# Patient Record
Sex: Male | Born: 1940
Health system: Southern US, Community
[De-identification: ages and names within clinical notes are randomized; demographics above are authoritative.]

## PROBLEM LIST (undated history)

## (undated) DIAGNOSIS — K219 Gastro-esophageal reflux disease without esophagitis: Secondary | ICD-10-CM

## (undated) DIAGNOSIS — E039 Hypothyroidism, unspecified: Secondary | ICD-10-CM

## (undated) DIAGNOSIS — I2510 Atherosclerotic heart disease of native coronary artery without angina pectoris: Secondary | ICD-10-CM

## (undated) DIAGNOSIS — I10 Essential (primary) hypertension: Secondary | ICD-10-CM

## (undated) DIAGNOSIS — T7840XA Allergy, unspecified, initial encounter: Secondary | ICD-10-CM

## (undated) DIAGNOSIS — H409 Unspecified glaucoma: Secondary | ICD-10-CM

## (undated) DIAGNOSIS — E7800 Pure hypercholesterolemia, unspecified: Secondary | ICD-10-CM

## (undated) DIAGNOSIS — M1990 Unspecified osteoarthritis, unspecified site: Secondary | ICD-10-CM

## (undated) DIAGNOSIS — H269 Unspecified cataract: Secondary | ICD-10-CM

## (undated) DIAGNOSIS — R011 Cardiac murmur, unspecified: Secondary | ICD-10-CM

## (undated) DIAGNOSIS — I219 Acute myocardial infarction, unspecified: Secondary | ICD-10-CM

## (undated) HISTORY — DX: Unspecified cataract: H26.9

## (undated) HISTORY — DX: Unspecified osteoarthritis, unspecified site: M19.90

## (undated) HISTORY — DX: Cardiac murmur, unspecified: R01.1

## (undated) HISTORY — DX: Acute myocardial infarction, unspecified: I21.9

## (undated) HISTORY — DX: Gastro-esophageal reflux disease without esophagitis: K21.9

## (undated) HISTORY — DX: Allergy, unspecified, initial encounter: T78.40XA

## (undated) HISTORY — PX: DENTAL RESTORATION/EXTRACTION WITH X-RAY: SHX5796

## (undated) HISTORY — DX: Unspecified glaucoma: H40.9

---

## 2012-02-23 HISTORY — PX: CORONARY ARTERY BYPASS GRAFT: SHX141

## 2012-09-04 ENCOUNTER — Non-Acute Institutional Stay (SKILLED_NURSING_FACILITY): Payer: Medicare Other | Admitting: Adult Health

## 2012-09-04 DIAGNOSIS — Z951 Presence of aortocoronary bypass graft: Secondary | ICD-10-CM

## 2012-09-04 DIAGNOSIS — I10 Essential (primary) hypertension: Secondary | ICD-10-CM

## 2012-09-04 DIAGNOSIS — I214 Non-ST elevation (NSTEMI) myocardial infarction: Secondary | ICD-10-CM

## 2012-09-04 DIAGNOSIS — I498 Other specified cardiac arrhythmias: Secondary | ICD-10-CM

## 2012-09-04 DIAGNOSIS — E039 Hypothyroidism, unspecified: Secondary | ICD-10-CM

## 2012-09-04 DIAGNOSIS — E785 Hyperlipidemia, unspecified: Secondary | ICD-10-CM

## 2012-09-04 DIAGNOSIS — G4700 Insomnia, unspecified: Secondary | ICD-10-CM

## 2012-09-07 ENCOUNTER — Non-Acute Institutional Stay (SKILLED_NURSING_FACILITY): Payer: Medicare Other | Admitting: Internal Medicine

## 2012-09-07 DIAGNOSIS — I10 Essential (primary) hypertension: Secondary | ICD-10-CM

## 2012-09-07 DIAGNOSIS — K219 Gastro-esophageal reflux disease without esophagitis: Secondary | ICD-10-CM

## 2012-09-07 DIAGNOSIS — I214 Non-ST elevation (NSTEMI) myocardial infarction: Secondary | ICD-10-CM

## 2012-09-07 DIAGNOSIS — E039 Hypothyroidism, unspecified: Secondary | ICD-10-CM

## 2012-09-18 ENCOUNTER — Encounter: Payer: Self-pay | Admitting: Adult Health

## 2012-09-18 DIAGNOSIS — I498 Other specified cardiac arrhythmias: Secondary | ICD-10-CM | POA: Insufficient documentation

## 2012-09-18 DIAGNOSIS — I10 Essential (primary) hypertension: Secondary | ICD-10-CM | POA: Insufficient documentation

## 2012-09-18 DIAGNOSIS — G4700 Insomnia, unspecified: Secondary | ICD-10-CM | POA: Insufficient documentation

## 2012-09-18 DIAGNOSIS — Z951 Presence of aortocoronary bypass graft: Secondary | ICD-10-CM | POA: Insufficient documentation

## 2012-09-18 DIAGNOSIS — E785 Hyperlipidemia, unspecified: Secondary | ICD-10-CM | POA: Insufficient documentation

## 2012-09-18 DIAGNOSIS — E039 Hypothyroidism, unspecified: Secondary | ICD-10-CM | POA: Insufficient documentation

## 2012-09-18 DIAGNOSIS — I214 Non-ST elevation (NSTEMI) myocardial infarction: Secondary | ICD-10-CM | POA: Insufficient documentation

## 2012-09-18 NOTE — Progress Notes (Signed)
  Subjective:    Patient ID: Jeffrey Rhodes, male    DOB: September 27, 1940, 72 y.o.   MRN: [id number]  HPI This is a 72 year old male who is being admitted to [facility] on 09/02/12 from [facility]. He was having chest pain and was diagnosed with non-ST elevation myocardial infarction and now status post multi-vessel coronary artery bypass grafting. He has been admitted for short-term rehabilitation. The [facility] shows 9.666 - elevated. He has hypothyroidism and currently taking Synthroid.   Review of Systems  Constitutional: Negative.   HENT: Negative.   Respiratory: Negative for cough, chest tightness and shortness of breath.   Cardiovascular: Negative.   Gastrointestinal: Negative for abdominal pain and abdominal distention.  Endocrine: Negative.   Genitourinary: Negative.   Neurological: Negative.   Psychiatric/Behavioral: Negative.        Objective:   Physical Exam  Nursing note and vitals reviewed. Constitutional: He is oriented to person, place, and time. He appears well-developed and well-nourished.  HENT:  Head: Normocephalic and atraumatic.  Right Ear: External ear normal.  Left Ear: External ear normal.  Nose: Nose normal.  Mouth/Throat: Oropharynx is clear and moist.  Eyes: Conjunctivae and EOM are normal. Pupils are equal, round, and reactive to light.  Cardiovascular: Normal rate, regular rhythm, normal heart sounds and intact distal pulses.   Pulmonary/Chest: Effort normal and breath sounds normal. No respiratory distress.  Abdominal: Soft. Bowel sounds are normal.  Musculoskeletal: Normal range of motion. He exhibits no edema and no tenderness.  Neurological: He is alert and oriented to person, place, and time.  Skin: Skin is warm and dry.  Psychiatric: He has a normal mood and affect. His behavior is normal. Judgment and thought content normal.     LABS/RADIOLOGY: 09/04/12 TSH 9.666 lipid profile normal 09/01/12 WBC 16.7 hemoglobin 9.6 hematocrit 28.0  sodium 137 potassium 3.8 glucose 120 BUN 16 creatinine 0.89 calcium 8.9    Medications reviewed.    Assessment & Plan:   Non-ST elevation MI (NSTEMI) -stable  S/P CABG (coronary artery bypass graft) - for PT and OT  Dyslipidemia - stable  Sinus arrhythmia - rate controlled  Essential hypertension, benign - well-controlled  Hypothyroidism - increase Synthroid to 137 mcg 1 tab by mouth daily; TSH in 6 weeks  Insomnia - change on BN 5 mg to 1 tablet by mouth each bedtime when necessary

## 2012-10-03 ENCOUNTER — Other Ambulatory Visit: Payer: Self-pay | Admitting: Adult Health

## 2012-10-04 NOTE — Progress Notes (Signed)
Patient ID: Jeffrey Rhodes, male   DOB: [birth year], 72 y.o.   MRN: [id number]        HISTORY & PHYSICAL  DATE: 09/07/2012   FACILITY: [facility]  LEVEL OF CARE: SNF (31)  ALLERGIES:   NKDA.   CHIEF COMPLAINT:  Manage non-ST elevation acute MI, hypothyroidism, and hypertension.    HISTORY OF PRESENT ILLNESS:  The patient is a 72 year-old, Caucasian male.   NON-ST ELEVATION ACUTE MI:  The patient was in for a non-ST elevation acute MI.  Selective coronary angiography demonstrated severe multi-vessel coronary artery disease with an EF of 60%.  Therefore, he underwent three-vessel CABG without any complications.  He denies chest pain, shortness of breath or palpitations.  He is admitted to this facility for short-term rehabilitation.      HYPOTHYROIDISM: The hypothyroidism remains stable. No complications noted from the medications presently being used.  The patient denies fatigue or constipation.  Last TSH:  9.666.  HTN: Pt 's HTN remains stable.  Denies CP, sob, DOE, pedal edema, headaches, dizziness or visual disturbances.  No complications from the medications currently being used.  Last BP :  136/74, 134/72, 120/65.    PAST MEDICAL HISTORY :  Hypertension.    Hypothyroidism.    PAST SURGICAL HISTORY:none  SOCIAL HISTORY: TOBACCO USE:  The patient was a smoker, but quit five years ago. ALCOHOL:  Denies alcohol use.  ILLICIT DRUGS:  Denies illicit drug use.    FAMILY HISTORY:  SIBLINGS:  Brother had a heart valve replacement.    CURRENT MEDICATIONS: Reviewed per [facility]  REVIEW OF SYSTEMS:  See HPI otherwise 14 point ROS is negative.  PHYSICAL EXAMINATION  VS:  T 97.6       P 66      RR 20      BP 136/74      POX 98% room air        WT (Lb)  GENERAL: no acute distress, normal body habitus SKIN: warm & dry, no suspicious lesions or rashes, no excessive dryness, chest incision clean and dry  EYES: conjunctivae normal, sclerae normal, normal eye  lids MOUTH/THROAT: lips without lesions,no lesions in the mouth,tongue is without lesions,uvula elevates in midline NECK: supple, trachea midline, no neck masses, no thyroid tenderness, no thyromegaly LYMPHATICS: no LAN in the neck, no supraclavicular LAN RESPIRATORY: breathing is even & unlabored, BS CTAB CARDIAC: RRR, no murmur,no extra heart sounds EDEMA/VARICOSITIES:  +1 bilateral lower extremity pitting edema  ARTERIAL:  pedal pulses +1  GI:  ABDOMEN: abdomen soft, normal BS, no masses, no tenderness  LIVER/SPLEEN: no hepatomegaly, no splenomegaly MUSCULOSKELETAL: HEAD: normal to inspection & palpation BACK: no kyphosis, scoliosis or spinal processes tenderness EXTREMITIES: LEFT UPPER EXTREMITY: strength intact, range of motion moderate due to surgery RIGHT UPPER EXTREMITY: strength intact, range of motion moderate due to surgery LEFT LOWER EXTREMITY: strength intact, range of motion moderate  RIGHT LOWER EXTREMITY: strength intact, range of motion moderate  PSYCHIATRIC: the patient is alert & oriented to person, affect & behavior appropriate  LABS/RADIOLOGY: HDL 25, otherwise lipid profile normal.   WBC 16.7, hemoglobin 9.6, MCV 90.9, platelets 215.  Glucose 120, otherwise BMP normal.    Urinalysis negative.   MRSA by PCR negative.     Albumin 3.4, otherwise liver profile normal.   ASSESSMENT/PLAN:  Acute non-ST elevation MI.  Status post CABG.  Continue rehabilitation.   Hypothyroidism.  TSH elevated.  Synthroid was increased.  Recheck TSH in six  weeks pending.    Hypertension.  Well controlled.    GERD.  Well controlled.     Hyperlipidemia.  Well controlled.    Allergic rhinitis.  Denies ongoing symptoms.     I have reviewed patient's medical records received at admission/from hospitalization.  CPT CODE: 96045

## 2012-10-05 DIAGNOSIS — K219 Gastro-esophageal reflux disease without esophagitis: Secondary | ICD-10-CM | POA: Insufficient documentation

## 2015-03-17 DIAGNOSIS — E785 Hyperlipidemia, unspecified: Secondary | ICD-10-CM | POA: Diagnosis not present

## 2015-03-17 DIAGNOSIS — M5030 Other cervical disc degeneration, unspecified cervical region: Secondary | ICD-10-CM | POA: Diagnosis not present

## 2015-03-17 DIAGNOSIS — E039 Hypothyroidism, unspecified: Secondary | ICD-10-CM | POA: Diagnosis not present

## 2015-03-17 DIAGNOSIS — I10 Essential (primary) hypertension: Secondary | ICD-10-CM | POA: Diagnosis not present

## 2015-03-17 DIAGNOSIS — Z23 Encounter for immunization: Secondary | ICD-10-CM | POA: Diagnosis not present

## 2015-03-17 DIAGNOSIS — Z760 Encounter for issue of repeat prescription: Secondary | ICD-10-CM | POA: Diagnosis not present

## 2015-03-17 DIAGNOSIS — M2550 Pain in unspecified joint: Secondary | ICD-10-CM | POA: Diagnosis not present

## 2015-03-17 DIAGNOSIS — G4709 Other insomnia: Secondary | ICD-10-CM | POA: Diagnosis not present

## 2015-03-17 DIAGNOSIS — R7309 Other abnormal glucose: Secondary | ICD-10-CM | POA: Diagnosis not present

## 2015-04-26 DIAGNOSIS — M25541 Pain in joints of right hand: Secondary | ICD-10-CM | POA: Diagnosis not present

## 2015-04-27 ENCOUNTER — Emergency Department (HOSPITAL_COMMUNITY): Payer: Medicare Other

## 2015-04-27 ENCOUNTER — Encounter (HOSPITAL_COMMUNITY): Payer: Self-pay | Admitting: Emergency Medicine

## 2015-04-27 ENCOUNTER — Observation Stay (HOSPITAL_COMMUNITY)
Admission: EM | Admit: 2015-04-27 | Discharge: 2015-04-28 | Disposition: A | Discharge status: Home / Self Care | Payer: Medicare Other | Attending: Cardiology | Admitting: Cardiology

## 2015-04-27 DIAGNOSIS — R110 Nausea: Secondary | ICD-10-CM | POA: Diagnosis not present

## 2015-04-27 DIAGNOSIS — Z7902 Long term (current) use of antithrombotics/antiplatelets: Secondary | ICD-10-CM | POA: Diagnosis not present

## 2015-04-27 DIAGNOSIS — Z7982 Long term (current) use of aspirin: Secondary | ICD-10-CM | POA: Diagnosis not present

## 2015-04-27 DIAGNOSIS — E039 Hypothyroidism, unspecified: Secondary | ICD-10-CM | POA: Diagnosis present

## 2015-04-27 DIAGNOSIS — E785 Hyperlipidemia, unspecified: Secondary | ICD-10-CM | POA: Diagnosis present

## 2015-04-27 DIAGNOSIS — I10 Essential (primary) hypertension: Secondary | ICD-10-CM | POA: Diagnosis not present

## 2015-04-27 DIAGNOSIS — Z951 Presence of aortocoronary bypass graft: Secondary | ICD-10-CM

## 2015-04-27 DIAGNOSIS — I25119 Atherosclerotic heart disease of native coronary artery with unspecified angina pectoris: Secondary | ICD-10-CM | POA: Diagnosis not present

## 2015-04-27 DIAGNOSIS — Z87891 Personal history of nicotine dependence: Secondary | ICD-10-CM | POA: Insufficient documentation

## 2015-04-27 DIAGNOSIS — Z79899 Other long term (current) drug therapy: Secondary | ICD-10-CM | POA: Diagnosis not present

## 2015-04-27 DIAGNOSIS — I209 Angina pectoris, unspecified: Secondary | ICD-10-CM | POA: Diagnosis not present

## 2015-04-27 DIAGNOSIS — Z955 Presence of coronary angioplasty implant and graft: Secondary | ICD-10-CM | POA: Diagnosis not present

## 2015-04-27 DIAGNOSIS — I2510 Atherosclerotic heart disease of native coronary artery without angina pectoris: Secondary | ICD-10-CM | POA: Insufficient documentation

## 2015-04-27 DIAGNOSIS — R079 Chest pain, unspecified: Principal | ICD-10-CM | POA: Diagnosis present

## 2015-04-27 DIAGNOSIS — R072 Precordial pain: Secondary | ICD-10-CM | POA: Diagnosis not present

## 2015-04-27 HISTORY — DX: Hypothyroidism, unspecified: E03.9

## 2015-04-27 HISTORY — DX: Pure hypercholesterolemia, unspecified: E78.00

## 2015-04-27 HISTORY — DX: Atherosclerotic heart disease of native coronary artery without angina pectoris: I25.10

## 2015-04-27 HISTORY — DX: Essential (primary) hypertension: I10

## 2015-04-27 LAB — CBC
HCT: 43.2 % (ref 39.0–52.0)
HEMATOCRIT: 42 % (ref 39.0–52.0)
HEMOGLOBIN: 14.7 g/dL (ref 13.0–17.0)
Hemoglobin: 14.1 g/dL (ref 13.0–17.0)
MCH: 30.4 pg (ref 26.0–34.0)
MCH: 30.8 pg (ref 26.0–34.0)
MCHC: 33.6 g/dL (ref 30.0–36.0)
MCHC: 34 g/dL (ref 30.0–36.0)
MCV: 90.5 fL (ref 78.0–100.0)
MCV: 90.6 fL (ref 78.0–100.0)
PLATELETS: 269 10*3/uL (ref 150–400)
Platelets: 299 10*3/uL (ref 150–400)
RBC: 4.64 MIL/uL (ref 4.22–5.81)
RBC: 4.77 MIL/uL (ref 4.22–5.81)
RDW: 12.5 % (ref 11.5–15.5)
RDW: 12.6 % (ref 11.5–15.5)
WBC: 10.1 10*3/uL (ref 4.0–10.5)
WBC: 13.2 10*3/uL — AB (ref 4.0–10.5)

## 2015-04-27 LAB — BASIC METABOLIC PANEL
ANION GAP: 12 (ref 5–15)
BUN: 11 mg/dL (ref 6–20)
CHLORIDE: 104 mmol/L (ref 101–111)
CO2: 25 mmol/L (ref 22–32)
Calcium: 9.9 mg/dL (ref 8.9–10.3)
Creatinine, Ser: 0.99 mg/dL (ref 0.61–1.24)
GFR calc Af Amer: >60 mL/min (ref >60)
GLUCOSE: 116 mg/dL — AB (ref 65–99)
POTASSIUM: 4.1 mmol/L (ref 3.5–5.1)
Sodium: 141 mmol/L (ref 135–145)

## 2015-04-27 LAB — I-STAT TROPONIN, ED: Troponin i, poc: 0 ng/mL (ref 0.00–0.08)

## 2015-04-27 NOTE — ED Notes (Signed)
Pt reports CP that began yesterday and intermittently worse today which prompted him to come to the ER; pt reports nausea also that is now resolved; pt reports pain has improved; pt reports hx of triple bypass surgery (2-3 years ago) and daily 81mg  ASA taken this morning; VSS at this time

## 2015-04-27 NOTE — ED Notes (Signed)
MD at bedside. 

## 2015-04-27 NOTE — ED Provider Notes (Signed)
CSN: [id number]     Arrival date & time 04/27/15  1917 History   First MD Initiated Contact with Patient 04/27/15 2204     Chief Complaint  Patient presents with  . Chest Pain     (Consider location/radiation/quality/duration/timing/severity/associated sxs/prior Treatment) HPI Comments: Patient is a 75 year old male with past medical history of hypertension and coronary artery disease. He is status post CABG 3 approximately 3 years ago. This was performed in [facility]. He presents today with discomfort in his left chest which started today while at rest. He felt nauseated when the pain began. He denies any shortness of breath, fevers, or cough. He does report to me that this discomfort is similar to what he experienced with his prior heart issues.  Patient is a 74 y.o. male presenting with chest pain. The history is provided by the patient.  Chest Pain Pain location:  L chest and substernal area Pain quality: pressure   Pain radiates to:  Does not radiate Pain radiates to the back: no   Pain severity:  Moderate Onset quality:  Sudden Duration:  12 hours Timing:  Intermittent Chronicity:  New Relieved by:  Nothing Worsened by:  Nothing tried Ineffective treatments:  None tried   Past Medical History  Diagnosis Date  . Hypertension   . Hypothyroid   . High cholesterol    Past Surgical History  Procedure Laterality Date  . Coronary artery bypass graft     No family history on file. Social History  Substance Use Topics  . Smoking status: Former Research scientist (life sciences)  . Smokeless tobacco: None  . Alcohol Use: Yes    Review of Systems  Cardiovascular: Positive for chest pain.  All other systems reviewed and are negative.     Allergies  Review of patient's allergies indicates not on file.  Home Medications   Prior to Admission medications   Not on File   BP 140/69 mmHg  Pulse 51  Temp(Src) 98.5 F (36.9 C) (Oral)  Resp 14  Ht 6' (1.829 m)  SpO2 97% Physical Exam   Constitutional: He is oriented to person, place, and time. He appears well-developed and well-nourished. No distress.  HENT:  Head: Normocephalic and atraumatic.  Mouth/Throat: Oropharynx is clear and moist.  Neck: Normal range of motion. Neck supple.  Cardiovascular: Normal rate and regular rhythm.  Exam reveals no friction rub.   No murmur heard. Pulmonary/Chest: Effort normal and breath sounds normal. No respiratory distress. He has no wheezes. He has no rales.  Abdominal: Soft. Bowel sounds are normal. He exhibits no distension. There is no tenderness.  Musculoskeletal: Normal range of motion. He exhibits no edema.  Neurological: He is alert and oriented to person, place, and time. Coordination normal.  Skin: Skin is warm and dry. He is not diaphoretic.  Nursing note and vitals reviewed.   ED Course  Procedures (including critical care time) Labs Review Labs Reviewed  BASIC METABOLIC PANEL - Abnormal; Notable for the following:    Glucose, Bld 116 (*)    All other components within normal limits  CBC  I-STAT TROPOININ, ED    Imaging Review Dg Chest 2 View  04/27/2015  CLINICAL DATA:  Central chest pain and nausea for 1 hour. EXAM: CHEST  2 VIEW COMPARISON:  None. FINDINGS: Patient is post median sternotomy and CABG. Borderline mild cardiomegaly, mediastinal contours are normal. Eventration of both hemidiaphragms anteriorly. Linear atelectasis or scarring in the lingula. Pulmonary vasculature is normal. No consolidation, pleural effusion, or pneumothorax. No  acute osseous abnormalities are seen. There is degenerative change throughout the spine. IMPRESSION: Post CABG with borderline mild cardiomegaly.  No acute process. Electronically Signed   By: Jeb Levering M.D.   On: 04/27/2015 20:16   I have personally reviewed and evaluated these images and lab results as part of my medical decision-making.   EKG Interpretation   Date/Time:  Sunday April 27 2015 19:21:53  EST Ventricular Rate:  71 PR Interval:  152 QRS Duration: 88 QT Interval:  390 QTC Calculation: 423 R Axis:   74 Text Interpretation:  Normal sinus rhythm Cannot rule out Inferior infarct  , age undetermined Cannot rule out Anterior infarct , age undetermined  Abnormal ECG Confirmed by Blanche Scovell  MD, Makhiya Coburn ([id number]) on 04/27/2015 10:16:08  PM      MDM   Final diagnoses:  None    Patient with chest pressure since yesterday.  He has a history of CABG three years ago.  Today there are no ekg changes and troponin is negative.  Due to his history, I have consulted cardiology who will admit and rule out the patient.    Veryl Speak, MD 04/27/15 [phone]

## 2015-04-27 NOTE — H&P (Signed)
History & Physical    Patient ID: Jeffrey Rhodes MRN: [id number], DOB/AGE: [birth year]   Admit date: 04/27/2015   Primary Physician: No primary care provider on file. Primary Cardiologist: Mahala Menghini, MD [facility])  Patient Profile    [id number] former smoker with HTN, HLD, hypothyroidism CAD s/p CABG (2014 at [place]) who presents with CP.   Past Medical History    Past Medical History  Diagnosis Date  . Hypertension   . Hypothyroid   . High cholesterol   . Coronary artery disease     Past Surgical History  Procedure Laterality Date  . Coronary artery bypass graft  2014    [place]     Allergies  Allergies  Allergen Reactions  . Bee Venom Other (See Comments)    unknown  . Lisinopril     Other reaction(s): COUGH  . Losartan Other (See Comments)    never    History of Present Illness    75M former smoker with HTN, HLD, hypothyroidism CAD s/p CABG (2014 at [place]) who presents with CP.   Jeffrey Rhodes reports that yesterday while doing his normal routine he developed left sided chest pressure, 5-6/10 in severity. He had associated right finger tingling. Pain was non-radiating and he had no associated symptoms. No exacerbating factors (movement, inspiration, palpation) or alleviating factors. He did not try NTG. The episode lasted 2-3 hours and resolved spontaneously.   Today, while at rest, he developed another episode. This episode lasted 4-5 hours and was associated with nausea. He reports it felt somewhat like his MI (before CABG) but his MI pain was more central and the presenting symptoms are more leftward.  He bikes 20 minutes a few times per week and works 5 days per week at [employer] and does not get angina. He has not had a stress test or a cath since around the time of his CABG.   He was hemodynamically stable on arrival. HR 71, BP 137/64, 98% on RA.  ECG demonstrated NSR and probable old inferior and anterior infarcts; no priors for comparison.   Labs were notable for K 4.1, Cr, 0.99, POC TnI 0.00. CXR demonstrated no acute process. He is currently chest pain free.   Home Medications    Prior to Admission medications   Not on File    Family History    No family history on file.  Social History    Social History   Social History  . Marital Status: Widowed    Spouse Name: N/A  . Number of Children: 2  . Years of Education: N/A   Occupational History  . Furniture [employer]    Social History Main Topics  . Smoking status: Former Research scientist (life sciences)  . Smokeless tobacco: Not on file  . Alcohol Use: 0.0 oz/week    0 Standard drinks or equivalent per week     Comment: occasional  . Drug Use: No  . Sexual Activity: Not on file   Other Topics Concern  . Not on file   Social History Narrative     Review of Systems    General:  No chills, fever, night sweats or weight changes.  Cardiovascular:  No chest pain, dyspnea on exertion, edema, orthopnea, palpitations, paroxysmal nocturnal dyspnea. Dermatological: No rash, lesions/masses Respiratory: No cough, dyspnea Urologic: No hematuria, dysuria Abdominal:   No nausea, vomiting, diarrhea, bright red blood per rectum, melena, or hematemesis Neurologic:  No visual changes, wkns, changes in mental status. All other systems  reviewed and are otherwise negative except as noted above.  Physical Exam    Blood pressure 140/69, pulse 51, temperature 98.5 F (36.9 C), temperature source Oral, resp. rate 14, height 6' (1.829 m), SpO2 97 %.  General: Pleasant, NAD Psych: Normal affect. Neuro: Alert and oriented X 3. Moves all extremities spontaneously. HEENT: Normal  Neck: Supple without bruits or JVD. Lungs:  Resp regular and unlabored, CTA. Heart: RRR no s3, s4, or murmurs. Abdomen: Soft, non-tender, non-distended, BS + x 4.  Extremities: No clubbing, cyanosis or edema. DP/PT/Radials 2+ and equal bilaterally.  Labs    Troponin [facility] Test)  Recent Labs  04/27/15 2004    TROPIPOC 0.00   No results for input(s): CKTOTAL, CKMB, TROPONINI in the last 72 hours. Lab Results  Component Value Date   WBC 10.1 04/27/2015   HGB 14.1 04/27/2015   HCT 42.0 04/27/2015   MCV 90.5 04/27/2015   PLT 299 04/27/2015    Recent Labs Lab 04/27/15 1937  NA 141  K 4.1  CL 104  CO2 25  BUN 11  CREATININE 0.99  CALCIUM 9.9  GLUCOSE 116*   No results found for: CHOL, HDL, LDLCALC, TRIG No results found for: [facility]   Radiology Studies    Dg Chest 2 View  04/27/2015  CLINICAL DATA:  Central chest pain and nausea for 1 hour. EXAM: CHEST  2 VIEW COMPARISON:  None. FINDINGS: Patient is post median sternotomy and CABG. Borderline mild cardiomegaly, mediastinal contours are normal. Eventration of both hemidiaphragms anteriorly. Linear atelectasis or scarring in the lingula. Pulmonary vasculature is normal. No consolidation, pleural effusion, or pneumothorax. No acute osseous abnormalities are seen. There is degenerative change throughout the spine. IMPRESSION: Post CABG with borderline mild cardiomegaly.  No acute process. Electronically Signed   By: Jeb Levering M.D.   On: 04/27/2015 20:16    ECG & Cardiac Imaging    ECG demonstrated NSR and probable old inferior and anterior infarcts. No priors for comparison.   Assessment & Plan    75M former smoker with HTN, HLD, hypothyroidism CAD s/p CABG (2014 at [place]) who presents with CP. The quality of the chest pain has both typical (left sided, associated numbness, associated nausea) and atypical (not worsened with exertion) aspects. ECG demonstrates old infarct but no acute changes and initial troponin is normal. With his history of CAD s/p CABG, he is higher risk for this being due to obstructive CAD. Prior to yesterdays episode, he has not had CP since his CABG.  A stress test is appropriate at this point. He is interested in transitioning care to [facility] cardiology.   CP in setting of CAD s/p CABG - cycle troponins -  Continue ASA 81mg , plavix - Switch metoprolol tartrate 25mg  daily to metoprolol succinate 25mg  daily - Switch pitavastatin to pravastatin - NPO for nuclear stress - A1c, Lipids  HTN - Continue norvasc - Switch metoprolol tartrate 25mg  daily to metoprolol succinate 25mg  daily  Hypothyroidism - Continue sythroid - Check TSH  Signed, Lamar Sprinkles, MD 04/27/2015, 10:37 PM

## 2015-04-27 NOTE — ED Notes (Signed)
C/o intermittent non-radiating pressure to L chest since yesterday with nausea.  Denies sob or any other associated symptoms.

## 2015-04-28 ENCOUNTER — Observation Stay (HOSPITAL_COMMUNITY): Payer: Medicare Other

## 2015-04-28 DIAGNOSIS — R072 Precordial pain: Secondary | ICD-10-CM | POA: Diagnosis not present

## 2015-04-28 DIAGNOSIS — R079 Chest pain, unspecified: Secondary | ICD-10-CM | POA: Diagnosis not present

## 2015-04-28 DIAGNOSIS — E785 Hyperlipidemia, unspecified: Secondary | ICD-10-CM | POA: Diagnosis not present

## 2015-04-28 DIAGNOSIS — I10 Essential (primary) hypertension: Secondary | ICD-10-CM | POA: Diagnosis not present

## 2015-04-28 DIAGNOSIS — Z87891 Personal history of nicotine dependence: Secondary | ICD-10-CM | POA: Diagnosis not present

## 2015-04-28 LAB — NM MYOCAR MULTI W/SPECT W/WALL MOTION / EF
CSEPPHR: 72 {beats}/min
Rest HR: 54 {beats}/min

## 2015-04-28 LAB — TROPONIN I
Troponin I: <0.03 ng/mL (ref <0.031)
Troponin I: <0.03 ng/mL (ref <0.031)

## 2015-04-28 LAB — LIPID PANEL
CHOL/HDL RATIO: 3 ratio
CHOLESTEROL: 142 mg/dL (ref 0–200)
HDL: 48 mg/dL (ref >40)
LDL Cholesterol: 72 mg/dL (ref 0–99)
TRIGLYCERIDES: 109 mg/dL (ref <150)
VLDL: 22 mg/dL (ref 0–40)

## 2015-04-28 LAB — CREATININE, SERUM
Creatinine, Ser: 0.96 mg/dL (ref 0.61–1.24)
GFR calc Af Amer: >60 mL/min (ref >60)
GFR calc non Af Amer: >60 mL/min (ref >60)

## 2015-04-28 LAB — TSH: TSH: 7.455 u[IU]/mL — AB (ref 0.350–4.500)

## 2015-04-28 MED ORDER — ASPIRIN EC 81 MG PO TBEC
81.0000 mg | DELAYED_RELEASE_TABLET | Freq: Every day | ORAL | Status: DC
  Administered 2015-04-28: 81 mg via ORAL
  Filled 2015-04-28: qty 1

## 2015-04-28 MED ORDER — TECHNETIUM TC 99M SESTAMIBI GENERIC - CARDIOLITE
10.0000 | Freq: Once | INTRAVENOUS | Status: AC | PRN
  Administered 2015-04-28: 10 via INTRAVENOUS

## 2015-04-28 MED ORDER — AMLODIPINE BESYLATE 10 MG PO TABS
10.0000 mg | ORAL_TABLET | Freq: Every day | ORAL | Status: DC
Start: 2015-04-28 — End: 2015-04-28
  Administered 2015-04-28: 10 mg via ORAL
  Filled 2015-04-28: qty 1

## 2015-04-28 MED ORDER — ONDANSETRON HCL 4 MG/2ML IJ SOLN: 4.0000 mg | Freq: Four times a day (QID) | INTRAMUSCULAR | Status: DC | PRN

## 2015-04-28 MED ORDER — GI COCKTAIL ~~LOC~~: 30.0000 mL | Freq: Four times a day (QID) | ORAL | Status: DC | PRN

## 2015-04-28 MED ORDER — ENOXAPARIN SODIUM 40 MG/0.4ML ~~LOC~~ SOLN
40.0000 mg | Freq: Every day | SUBCUTANEOUS | Status: DC
  Administered 2015-04-28: 40 mg via SUBCUTANEOUS
  Filled 2015-04-28: qty 0.4

## 2015-04-28 MED ORDER — TECHNETIUM TC 99M SESTAMIBI GENERIC - CARDIOLITE
30.0000 | Freq: Once | INTRAVENOUS | Status: AC | PRN
  Administered 2015-04-28: 30 via INTRAVENOUS

## 2015-04-28 MED ORDER — METOPROLOL SUCCINATE ER 25 MG PO TB24
25.0000 mg | ORAL_TABLET | Freq: Every day | ORAL | Status: DC
  Administered 2015-04-28: 25 mg via ORAL
  Filled 2015-04-28: qty 1

## 2015-04-28 MED ORDER — REGADENOSON 0.4 MG/5ML IV SOLN
0.4000 mg | Freq: Once | INTRAVENOUS | Status: AC
  Administered 2015-04-28: 0.4 mg via INTRAVENOUS

## 2015-04-28 MED ORDER — LEVOTHYROXINE SODIUM 25 MCG PO TABS
137.0000 ug | ORAL_TABLET | Freq: Every day | ORAL | Status: DC
  Administered 2015-04-28: 137 ug via ORAL
  Filled 2015-04-28: qty 1

## 2015-04-28 MED ORDER — REGADENOSON 0.4 MG/5ML IV SOLN
INTRAVENOUS | Status: AC
  Filled 2015-04-28: qty 5

## 2015-04-28 MED ORDER — PRAVASTATIN SODIUM 40 MG PO TABS: 40.0000 mg | ORAL_TABLET | Freq: Every day | ORAL | Status: DC

## 2015-04-28 MED ORDER — CLOPIDOGREL BISULFATE 75 MG PO TABS
75.0000 mg | ORAL_TABLET | Freq: Every day | ORAL | Status: DC
  Administered 2015-04-28: 75 mg via ORAL
  Filled 2015-04-28: qty 1

## 2015-04-28 MED ORDER — ZOLPIDEM TARTRATE 5 MG PO TABS
5.0000 mg | ORAL_TABLET | Freq: Every evening | ORAL | Status: DC | PRN
  Administered 2015-04-28: 5 mg via ORAL
  Filled 2015-04-28: qty 1

## 2015-04-28 MED ORDER — ACETAMINOPHEN 325 MG PO TABS: 650.0000 mg | ORAL_TABLET | ORAL | Status: DC | PRN

## 2015-04-28 MED ORDER — FLUTICASONE PROPIONATE 50 MCG/ACT NA SUSP: 2.0000 | Freq: Every day | NASAL | Status: DC | PRN

## 2015-04-28 MED ORDER — METOPROLOL SUCCINATE ER 25 MG PO TB24: 25.0000 mg | ORAL_TABLET | Freq: Every day | ORAL | Status: DC

## 2015-04-28 NOTE — Progress Notes (Signed)
    Subjective:  Denies CP or dyspnea   Objective:  Filed Vitals:   04/27/15 2330 04/28/15 0057 04/28/15 0126 04/28/15 0502  BP: 152/75 171/67 153/72 125/71  Pulse: 57 57 53 56  Temp:  98.2 F (36.8 C)  98.3 F (36.8 C)  TempSrc:  Oral  Oral  Resp: 20 18  17   Height:  6' (1.829 m)    Weight:  218 lb 8 oz (99.111 kg)    SpO2: 96% 96%  97%    Intake/Output from previous day: No intake or output data in the 24 hours ending 04/28/15 0856  Physical Exam: Physical exam: Well-developed well-nourished in no acute distress.  Skin is warm and dry.  HEENT is normal.  Neck is supple.  Chest is clear to auscultation with normal expansion.  Cardiovascular exam is regular rate and rhythm.  Abdominal exam nontender or distended. No masses palpated. Extremities show no edema. neuro grossly intact    Lab Results: Basic Metabolic Panel:  Recent Labs  04/27/15 1937 04/27/15 2340  NA 141  --   K 4.1  --   CL 104  --   CO2 25  --   GLUCOSE 116*  --   BUN 11  --   CREATININE 0.99 0.96  CALCIUM 9.9  --    CBC:  Recent Labs  04/27/15 1937 04/27/15 2340  WBC 10.1 13.2*  HGB 14.1 14.7  HCT 42.0 43.2  MCV 90.5 90.6  PLT 299 269   Cardiac Enzymes:  Recent Labs  04/27/15 2340 04/28/15 0212 04/28/15 0503  TROPONINI <0.03 <0.03 <0.03     Assessment/Plan:  1 chest pain-symptoms are atypical. Enzymes negative. Nuclear study shows preserved LV function and no ischemia. Plan discharge today with outpatient follow-up.  2 coronary artery disease status post coronary artery bypass graft-continue aspirin and statin. 3 hyperlipidemia-given document coronary disease I would prefer that he be on high-dose statin long-term. This can be discussed as an outpatient. 4 hypertension-continue present pressure medications and adjust after discharge. Patient can be discharged today. He would like to research cardiologists before scheduling a follow-up visit. >30 min PA and physician  time D2 Kirk Ruths 04/28/2015, 8:56 AM

## 2015-04-28 NOTE — Progress Notes (Signed)
    Subjective: Still having 1/10 chest pressure up by the left shoulder  Objective: Vital signs in last 24 hours: Temp:  [98.2 F (36.8 C)-98.5 F (36.9 C)] 98.3 F (36.8 C) (03/06 0502) Pulse Rate:  [51-71] 55 (03/06 0917) Resp:  [13-20] 17 (03/06 0502) BP: (125-171)/(64-75) 148/74 mmHg (03/06 0917) SpO2:  [96 %-98 %] 97 % (03/06 0502) Weight:  [218 lb 8 oz (99.111 kg)] 218 lb 8 oz (99.111 kg) (03/06 0057) Last BM Date: 04/27/15  Intake/Output from previous day:   Intake/Output this shift: Total I/O In: 300 [P.O.:300] Out: -   Medications Scheduled Meds: . amLODipine  10 mg Oral Daily  . aspirin EC  81 mg Oral Daily  . clopidogrel  75 mg Oral Daily  . enoxaparin (LOVENOX) injection  40 mg Subcutaneous Daily  . levothyroxine  137 mcg Oral QAC breakfast  . metoprolol succinate  25 mg Oral Daily  . pravastatin  40 mg Oral q1800  . regadenoson       Continuous Infusions:  PRN Meds:.acetaminophen, fluticasone, gi cocktail, ondansetron (ZOFRAN) IV, technetium sestamibi generic, zolpidem  PE: Well nourished, well developed, in no acute distress HEENT: Pupils are equal round react to light accommodation extraocular movements are intact.  Neck: no JVDNo cervical lymphadenopathy. Cardiac: Regular rate and rhythm without murmurs rubs or gallops. Chest:  nontender Lungs:  clear to auscultation bilaterally, no wheezing, rhonchi or rales Ext: no lower extremity edema.  2+ radial and dorsalis pedis pulses. Skin: warm and dry Neuro:  Grossly normal    Lab Results:   Recent Labs  04/27/15 1937 04/27/15 2340  WBC 10.1 13.2*  HGB 14.1 14.7  HCT 42.0 43.2  PLT 299 269   BMET  Recent Labs  04/27/15 1937 04/27/15 2340  NA 141  --   K 4.1  --   CL 104  --   CO2 25  --   GLUCOSE 116*  --   BUN 11  --   CREATININE 0.99 0.96  CALCIUM 9.9  --    PT/INR No results for input(s): LABPROT, INR in the last 72 hours. Cholesterol  Recent Labs  04/27/15 2341  CHOL  142   Lipid Panel     Component Value Date/Time   CHOL 142 04/27/2015 2341   TRIG 109 04/27/2015 2341   HDL 48 04/27/2015 2341   CHOLHDL 3.0 04/27/2015 2341   VLDL 22 04/27/2015 2341   LDLCALC 72 04/27/2015 2341   Cardiac Panel (last 3 results)  Recent Labs  04/27/15 2340 04/28/15 0212 04/28/15 0503  TROPONINI <0.03 <0.03 <0.03    Assessment/Plan 51M former smoker with HTN, HLD, hypothyroidism CAD s/p CABG (2014 at [place] by Dr. Jerelene Redden) who presents with CP.   Active Problems:   Dyslipidemia   Essential hypertension, benign   Hypothyroidism   Chest pain  Ruled out for MI. Meds: amlodipine 10, plavix 75, toprol 25, pravastatin 40.  LDL 72.  He tolerated the lexiscan well.  Results to follow.        Tarri Fuller PA-C 04/28/2015 9:45 AM  As above, see progress note from March 6. Kirk Ruths

## 2015-04-28 NOTE — Progress Notes (Signed)
Discharge Summary    Patient ID: Jeffrey Rhodes,  MRN: [id number], DOB/AGE: Mar 11, 1940 75 y.o.  Admit date: 04/27/2015 Discharge date: 04/28/2015  Primary Care Provider: No primary care provider on file. Primary Cardiologist:  Jeffrey Rhodes. [facility]  Discharge Diagnoses    Principal Problem:   Chest pain Active Problems:   Dyslipidemia   Essential hypertension, benign   Hypothyroidism    Allergies Allergies  Allergen Reactions  . Bee Venom Other (See Comments)    unknown  . Lisinopril     Other reaction(s): COUGH  . Losartan Other (See Comments)    never    Diagnostic Studies/Procedures    Nuclear stress test: The small defect of mild severity present in the mid inferior lateral, apical inferior and apical lateral location. This is considered low risk study. Ejection fraction 58% _____________   History of Present Illness      11M former smoker with HTN, HLD, hypothyroidism CAD s/p CABG (2014 at [place]) who presents with CP.   Mr. Jeffrey Rhodes reports that yesterday while doing his normal routine he developed left sided chest pressure, 5-6/10 in severity. He had associated right finger tingling. Pain was non-radiating and he had no associated symptoms. No exacerbating factors (movement, inspiration, palpation) or alleviating factors. He did not try NTG. The episode lasted 2-3 hours and resolved spontaneously. Today, while at rest, he developed another episode. This episode lasted 4-5 hours and was associated with nausea. He reports it felt somewhat like his MI (before CABG) but his MI pain was more central and the presenting symptoms are more leftward. He bikes 20 minutes a few times per week and works 5 days per week at [employer] and does not get angina. He has not had a stress test or a cath since around the time of his CABG.   He was hemodynamically stable on arrival. HR 71, BP 137/64, 98% on RA. ECG demonstrated NSR and probable old inferior and anterior  infarcts; no priors for comparison. Labs were notable for K 4.1, Cr, 0.99, POC TnI 0.00. CXR demonstrated no acute process. He is currently chest pain free.    Hospital Course     Consultants: None  Patient was admitted for observation.  He ruled out for MI. He underwent Lexiscan stress testing which revealed a small defect of mild severity present in the mid inferior lateral, apical inferior and apical lateral location. This is considered low risk study. Ejection fraction 58%.   we continued Norvasc and switched metoprolol tartrate to metoprolol succinate 25 mg daily. Continue aspirin and Plavix. His LDL cholesterol was 72 with triglycerides of 109 and HDL 48. His TSH is mildly elevated 7.455.  he was asked to follow-up with his primary care provider regarding adjustment to his Synthroid. The patient was seen by Dr. Stanford Rhodes who felt she was stable for DC home.  _____________  Discharge Vitals Blood pressure 117/62, pulse 61, temperature 97.7 F (36.5 C), temperature source Oral, resp. rate 18, height 6' (1.829 m), weight 218 lb 8 oz (99.111 kg), SpO2 96 %.  Filed Weights   04/28/15 0057  Weight: 218 lb 8 oz (99.111 kg)    Labs & Radiologic Studies     CBC  Recent Labs  04/27/15 1937 04/27/15 2340  WBC 10.1 13.2*  HGB 14.1 14.7  HCT 42.0 43.2  MCV 90.5 90.6  PLT 299 [id number]   Basic Metabolic Panel  Recent Labs  04/27/15 1937 04/27/15 2340  NA 141  --  K 4.1  --   CL 104  --   CO2 25  --   GLUCOSE 116*  --   BUN 11  --   CREATININE 0.99 0.96  CALCIUM 9.9  --    Fasting Lipid Panel  Recent Labs  04/27/15 2341  CHOL 142  HDL 48  LDLCALC 72  TRIG 109  CHOLHDL 3.0   Thyroid Function Tests  Recent Labs  04/27/15 2341  TSH 7.455*    Dg Chest 2 View  04/27/2015  CLINICAL DATA:  Central chest pain and nausea for 1 hour. EXAM: CHEST  2 VIEW COMPARISON:  None. FINDINGS: Patient is post median sternotomy and CABG. Borderline mild cardiomegaly, mediastinal  contours are normal. Eventration of both hemidiaphragms anteriorly. Linear atelectasis or scarring in the lingula. Pulmonary vasculature is normal. No consolidation, pleural effusion, or pneumothorax. No acute osseous abnormalities are seen. There is degenerative change throughout the spine. IMPRESSION: Post CABG with borderline mild cardiomegaly.  No acute process. Electronically Signed   By: Jeffrey Rhodes M.D.   On: 04/27/2015 20:16   Nm Myocar Multi W/spect W/wall Motion / Ef  04/28/2015   There was no ST segment deviation noted during stress.  No T wave inversion was noted during stress.  Defect 1: There is a small defect of mild severity present in the mid inferolateral, apical inferior and apical lateral location.  This is a low risk study.  The left ventricular ejection fraction is normal (55-65%).  Nuclear stress EF: 58%.     Disposition   Pt is being discharged home today in good condition.  Follow-up Plans & Appointments    Follow-up Information    Follow up with Jeffrey Krygier, PA-C On 05/19/2015.   Specialties:  Physician Assistant, Radiology, Interventional Cardiology   Why:  10:00AM   Contact information:   [address] [place] [address] [phone]      Discharge Instructions    Diet - low sodium heart healthy    Complete by:  As directed            Discharge Medications   Current Discharge Medication List    START taking these medications   Details  metoprolol succinate (TOPROL-XL) 25 MG 24 hr tablet Take 1 tablet (25 mg total) by mouth daily. Qty: 30 tablet, Refills: 11      CONTINUE these medications which have NOT CHANGED   Details  amLODipine (NORVASC) 10 MG tablet Take 10 mg by mouth daily.    aspirin EC 81 MG tablet Take 81 mg by mouth daily.    clopidogrel (PLAVIX) 75 MG tablet Take 75 mg by mouth daily.    fluticasone (FLONASE) 50 MCG/ACT nasal spray Place 2 sprays into both nostrils daily as needed for allergies or  rhinitis.    levothyroxine (SYNTHROID, LEVOTHROID) 137 MCG tablet Take 137 mcg by mouth daily before breakfast.    Pitavastatin Calcium (LIVALO) 2 MG TABS Take 2 mg by mouth every evening.    zolpidem (AMBIEN) 10 MG tablet Take 10 mg by mouth at bedtime as needed for sleep.      STOP taking these medications     metoprolol tartrate (LOPRESSOR) 25 MG tablet            Outstanding Labs/Studies     Duration of Discharge Encounter   Greater than 30 minutes including physician time.  Signed, Samara Snide, [place] PAC 04/28/2015, 2:20 PM

## 2015-04-28 NOTE — Care Management Obs Status (Signed)
[place] NOTIFICATION   Patient Details  Name: Smiley Blosser MRN: [id number] Date of Birth: 06/09/1940   Medicare Observation Status Notification Given:  Yes    Erenest Rasher, RN 04/28/2015, 1:37 PM

## 2015-04-28 NOTE — Care Management Note (Addendum)
Case Management Note  Patient Details  Name: Jeffrey Rhodes MRN: [id number] Date of Birth: August 30, 1940  Subjective/Objective:               Chest pain     Action/Plan: NCM spoke to pt and lives at home alone. Pt states he still works full-time. He is independent and could afford his medications. No NCM needs identified.   PCP - Bebe Liter MD  Expected Discharge Date:  04/28/2015             Expected Discharge Plan:  Home/Self Care  In-House Referral:  NA  Discharge planning Services  CM Consult  Post Acute Care Choice:  NA Choice offered to:  NA  DME Arranged:  N/A DME Agency:  NA  HH Arranged:  NA HH Agency:  NA  Status of Service:  Completed, signed off  Medicare Important Message Given:    Date Medicare IM Given:    Medicare IM give by:    Date Additional Medicare IM Given:    Additional Medicare Important Message give by:     If discussed at [place] of Stay Meetings, dates discussed:    Additional Comments:  Jeffrey Rasher, RN 04/28/2015, 1:35 PM

## 2015-04-29 LAB — HEMOGLOBIN A1C
Hgb A1c MFr Bld: 6 % — ABNORMAL HIGH (ref 4.8–5.6)
MEAN PLASMA GLUCOSE: 126 mg/dL

## 2015-04-30 DIAGNOSIS — E039 Hypothyroidism, unspecified: Secondary | ICD-10-CM | POA: Diagnosis not present

## 2015-04-30 DIAGNOSIS — F419 Anxiety disorder, unspecified: Secondary | ICD-10-CM | POA: Diagnosis not present

## 2015-04-30 DIAGNOSIS — G4709 Other insomnia: Secondary | ICD-10-CM | POA: Diagnosis not present

## 2015-05-05 NOTE — Discharge Summary (Signed)
Discharge Summary   Patient ID: Jeffrey Rhodes,  MRN: [id number], DOB/AGE: February 14, 1941 75 y.o.  Admit date: 04/27/2015 Discharge date: 04/28/2015  Primary Care Provider: No primary care provider on file. Primary Cardiologist: Mahala Menghini. [facility]  Discharge Diagnoses   Principal Problem:  Chest pain Active Problems:  Dyslipidemia  Essential hypertension, benign  Hypothyroidism    Allergies Allergies  Allergen Reactions  . Bee Venom Other (See Comments)    unknown  . Lisinopril     Other reaction(s): COUGH  . Losartan Other (See Comments)    never    Diagnostic Studies/Procedures   Nuclear stress test: The small defect of mild severity present in the mid inferior lateral, apical inferior and apical lateral location. This is considered low risk study. Ejection fraction 58% _____________  History of Present Illness    67M former smoker with HTN, HLD, hypothyroidism CAD s/p CABG (2014 at [place]) who presents with CP.  Mr. Roesel reports that yesterday while doing his normal routine he developed left sided chest pressure, 5-6/10 in severity. He had associated right finger tingling. Pain was non-radiating and he had no associated symptoms. No exacerbating factors (movement, inspiration, palpation) or alleviating factors. He did not try NTG. The episode lasted 2-3 hours and resolved spontaneously. Today, while at rest, he developed another episode. This episode lasted 4-5 hours and was associated with nausea. He reports it felt somewhat like his MI (before CABG) but his MI pain was more central and the presenting symptoms are more leftward. He bikes 20 minutes a few times per week and works 5 days per week at [employer] and does not get angina. He has not had a stress test or a cath since around the time of his CABG.  He was hemodynamically stable on arrival. HR 71, BP 137/64, 98%  on RA. ECG demonstrated NSR and probable old inferior and anterior infarcts; no priors for comparison. Labs were notable for K 4.1, Cr, 0.99, POC TnI 0.00. CXR demonstrated no acute process. He is currently chest pain free.    Hospital Course    Consultants: None  Patient was admitted for observation. He ruled out for MI. He underwent Lexiscan stress testing which revealed a small defect of mild severity present in the mid inferior lateral, apical inferior and apical lateral location. This is considered low risk study. Ejection fraction 58%. we continued Norvasc and switched metoprolol tartrate to metoprolol succinate 25 mg daily. Continue aspirin and Plavix. His LDL cholesterol was 72 with triglycerides of 109 and HDL 48. His TSH is mildly elevated 7.455. he was asked to follow-up with his primary care provider regarding adjustment to his Synthroid. The patient was seen by Dr. Stanford Breed who felt she was stable for DC home.  _____________  Discharge Vitals Blood pressure 117/62, pulse 61, temperature 97.7 F (36.5 C), temperature source Oral, resp. rate 18, height 6' (1.829 m), weight 218 lb 8 oz (99.111 kg), SpO2 96 %.  Filed Weights   04/28/15 0057  Weight: 218 lb 8 oz (99.111 kg)    Labs & Radiologic Studies    CBC  Recent Labs (last 2 labs)      Recent Labs  04/27/15 1937 04/27/15 2340  WBC 10.1 13.2*  HGB 14.1 14.7  HCT 42.0 43.2  MCV 90.5 90.6  PLT 299 269     Basic Metabolic Panel  Recent Labs (last 2 labs)      Recent Labs  04/27/15 1937 04/27/15 2340  NA 141 --   K 4.1 --   CL 104 --   CO2 25 --   GLUCOSE 116* --   BUN 11 --   CREATININE 0.99 0.96  CALCIUM 9.9 --      Fasting Lipid Panel  Recent Labs (last 2 labs)      Recent Labs  04/27/15 2341  CHOL 142  HDL 48  LDLCALC 72  TRIG 109  CHOLHDL 3.0     Thyroid Function Tests  Recent Labs (last 2 labs)        Recent Labs  04/27/15 2341  TSH 7.455*       Imaging Results    Dg Chest 2 View  04/27/2015 CLINICAL DATA: Central chest pain and nausea for 1 hour. EXAM: CHEST 2 VIEW COMPARISON: None. FINDINGS: Patient is post median sternotomy and CABG. Borderline mild cardiomegaly, mediastinal contours are normal. Eventration of both hemidiaphragms anteriorly. Linear atelectasis or scarring in the lingula. Pulmonary vasculature is normal. No consolidation, pleural effusion, or pneumothorax. No acute osseous abnormalities are seen. There is degenerative change throughout the spine. IMPRESSION: Post CABG with borderline mild cardiomegaly. No acute process. Electronically Signed By: Jeb Levering M.D. On: 04/27/2015 20:16   Nm Myocar Multi W/spect W/wall Motion / Ef  04/28/2015  There was no ST segment deviation noted during stress.  No T wave inversion was noted during stress.  Defect 1: There is a small defect of mild severity present in the mid inferolateral, apical inferior and apical lateral location.  This is a low risk study.  The left ventricular ejection fraction is normal (55-65%).  Nuclear stress EF: 58%.     Disposition   Pt is being discharged home today in good condition.  Follow-up Plans & Appointments    Follow-up Information    Follow up with Shaheed Schmuck, PA-C On 05/19/2015.   Specialties: Physician Assistant, Radiology, Interventional Cardiology   Why: 10:00AM   Contact information:   [address] [place] [address] [phone]      Discharge Instructions    Diet - low sodium heart healthy  Complete by: As directed            Discharge Medications   Current Discharge Medication List    START taking these medications   Details  metoprolol succinate (TOPROL-XL) 25 MG 24 hr tablet Take 1 tablet (25 mg total) by mouth daily. Qty: 30 tablet, Refills: 11      CONTINUE these  medications which have NOT CHANGED   Details  amLODipine (NORVASC) 10 MG tablet Take 10 mg by mouth daily.    aspirin EC 81 MG tablet Take 81 mg by mouth daily.    clopidogrel (PLAVIX) 75 MG tablet Take 75 mg by mouth daily.    fluticasone (FLONASE) 50 MCG/ACT nasal spray Place 2 sprays into both nostrils daily as needed for allergies or rhinitis.    levothyroxine (SYNTHROID, LEVOTHROID) 137 MCG tablet Take 137 mcg by mouth daily before breakfast.    Pitavastatin Calcium (LIVALO) 2 MG TABS Take 2 mg by mouth every evening.    zolpidem (AMBIEN) 10 MG tablet Take 10 mg by mouth at bedtime as needed for sleep.      STOP taking these medications     metoprolol tartrate (LOPRESSOR) 25 MG tablet           Outstanding Labs/Studies     Duration of Discharge Encounter   Greater than 30 minutes including physician time.  Signed, Tarri Fuller PAC  04/28/2015, 2:20 PM      Original DC summary was labelled incorrectly as a "progress note"  Thresea Doble PAC

## 2015-05-12 DIAGNOSIS — M79641 Pain in right hand: Secondary | ICD-10-CM | POA: Diagnosis not present

## 2015-05-12 DIAGNOSIS — M792 Neuralgia and neuritis, unspecified: Secondary | ICD-10-CM | POA: Diagnosis not present

## 2015-05-12 DIAGNOSIS — M19041 Primary osteoarthritis, right hand: Secondary | ICD-10-CM | POA: Diagnosis not present

## 2015-05-12 DIAGNOSIS — M2550 Pain in unspecified joint: Secondary | ICD-10-CM | POA: Diagnosis not present

## 2015-05-19 ENCOUNTER — Ambulatory Visit: Payer: Medicare Other | Admitting: Physician Assistant

## 2015-06-10 DIAGNOSIS — M79641 Pain in right hand: Secondary | ICD-10-CM | POA: Diagnosis not present

## 2015-06-10 DIAGNOSIS — G4700 Insomnia, unspecified: Secondary | ICD-10-CM | POA: Diagnosis not present

## 2015-06-10 DIAGNOSIS — M792 Neuralgia and neuritis, unspecified: Secondary | ICD-10-CM | POA: Diagnosis not present

## 2015-06-18 DIAGNOSIS — M79641 Pain in right hand: Secondary | ICD-10-CM | POA: Diagnosis not present

## 2015-07-14 DIAGNOSIS — B351 Tinea unguium: Secondary | ICD-10-CM | POA: Diagnosis not present

## 2015-07-14 DIAGNOSIS — G4709 Other insomnia: Secondary | ICD-10-CM | POA: Diagnosis not present

## 2015-07-14 DIAGNOSIS — M19041 Primary osteoarthritis, right hand: Secondary | ICD-10-CM | POA: Diagnosis not present

## 2015-08-27 DIAGNOSIS — E039 Hypothyroidism, unspecified: Secondary | ICD-10-CM | POA: Diagnosis not present

## 2015-08-27 DIAGNOSIS — I10 Essential (primary) hypertension: Secondary | ICD-10-CM | POA: Diagnosis not present

## 2015-08-27 DIAGNOSIS — E782 Mixed hyperlipidemia: Secondary | ICD-10-CM | POA: Diagnosis not present

## 2015-08-28 DIAGNOSIS — L570 Actinic keratosis: Secondary | ICD-10-CM | POA: Diagnosis not present

## 2015-08-28 DIAGNOSIS — L738 Other specified follicular disorders: Secondary | ICD-10-CM | POA: Diagnosis not present

## 2015-08-28 DIAGNOSIS — L820 Inflamed seborrheic keratosis: Secondary | ICD-10-CM | POA: Diagnosis not present

## 2015-10-20 NOTE — Progress Notes (Signed)
      HPI: Follow-up coronary artery disease. [place] In [place] 2017 he was admitted with chest pain and ruled out. His symptoms were felt to be atypical. Nuclear study showed an ejection fraction of 58%. No infarct or ischemia. TSH was elevated at 7.455. Since last seen He denies dyspnea, chest pain, palpitations or syncope.  Current Outpatient Prescriptions  Medication Sig Dispense Refill  . amLODipine (NORVASC) 10 MG tablet Take 10 mg by mouth daily.    Marland Kitchen aspirin EC 81 MG tablet Take 81 mg by mouth daily.    . clopidogrel (PLAVIX) 75 MG tablet Take 75 mg by mouth daily.    . fluticasone (FLONASE) 50 MCG/ACT nasal spray Place 2 sprays into both nostrils daily as needed for allergies or rhinitis.    Marland Kitchen levothyroxine (SYNTHROID, LEVOTHROID) 137 MCG tablet Take 137 mcg by mouth daily before breakfast.    . metoprolol succinate (TOPROL-XL) 25 MG 24 hr tablet Take 1 tablet (25 mg total) by mouth daily. 30 tablet 11  . zolpidem (AMBIEN) 10 MG tablet Take 10 mg by mouth at bedtime as needed for sleep.     No current facility-administered medications for this visit.      Past Medical History:  Diagnosis Date  . Coronary artery disease   . High cholesterol   . Hypertension   . Hypothyroid     Past Surgical History:  Procedure Laterality Date  . CORONARY ARTERY BYPASS GRAFT  2014   [place]    Social History   Social History  . Marital status: Widowed    Spouse name: N/A  . Number of children: 2  . Years of education: N/A   Occupational History  . Furniture Warehouse    Social History Main Topics  . Smoking status: Former Research scientist (life sciences)  . Smokeless tobacco: Never Used  . Alcohol use 0.0 oz/week     Comment: occasional  . Drug use: No  . Sexual activity: Not on file   Other Topics Concern  . Not on file   Social History Narrative  . No narrative on file    History reviewed. No pertinent family history.  ROS: no  fevers or chills, productive cough, hemoptysis, dysphasia, odynophagia, melena, hematochezia, dysuria, hematuria, rash, seizure activity, orthopnea, PND, pedal edema, claudication. Remaining systems are negative.  Physical Exam: Well-developed well-nourished in no acute distress.  Skin is warm and dry.  HEENT is normal.  Neck is supple.  Chest is clear to auscultation with normal expansion.  Cardiovascular exam is regular rate and rhythm.  Abdominal exam nontender or distended. No masses palpated. Extremities show no edema. neuro grossly intact  ECG Sinus rhythm at a rate of 52. First-degree AV block. Cannot rule out prior inferior infarct.  A/P  1 coronary artery disease-continue aspirin and statin. Discontinue Plavix.  2 hyperlipidemia-continue statin.  3 hypertension-blood pressure mildly elevated. I have asked him to track this at home and we will add additional medications as needed.  4 bruit-schedule abdominal ultrasound to exclude aneurysm.  Kirk Ruths, MD

## 2015-10-22 ENCOUNTER — Other Ambulatory Visit: Payer: Self-pay | Admitting: *Deleted

## 2015-10-22 ENCOUNTER — Ambulatory Visit (INDEPENDENT_AMBULATORY_CARE_PROVIDER_SITE_OTHER): Payer: Medicare Other | Admitting: Cardiology

## 2015-10-22 ENCOUNTER — Other Ambulatory Visit: Payer: Self-pay | Admitting: Cardiology

## 2015-10-22 ENCOUNTER — Ambulatory Visit (HOSPITAL_BASED_OUTPATIENT_CLINIC_OR_DEPARTMENT_OTHER)
Admission: RE | Admit: 2015-10-22 | Discharge: 2015-10-22 | Disposition: A | Discharge status: Home / Self Care | Payer: Medicare Other | Source: Ambulatory Visit | Attending: Cardiology | Admitting: Cardiology

## 2015-10-22 ENCOUNTER — Encounter: Payer: Self-pay | Admitting: Cardiology

## 2015-10-22 VITALS — BP 153/75 | HR 52 | Ht 72.0 in | Wt 225.0 lb

## 2015-10-22 DIAGNOSIS — R0989 Other specified symptoms and signs involving the circulatory and respiratory systems: Secondary | ICD-10-CM

## 2015-10-22 DIAGNOSIS — Z951 Presence of aortocoronary bypass graft: Secondary | ICD-10-CM

## 2015-10-22 DIAGNOSIS — I2510 Atherosclerotic heart disease of native coronary artery without angina pectoris: Secondary | ICD-10-CM

## 2015-10-22 DIAGNOSIS — E785 Hyperlipidemia, unspecified: Secondary | ICD-10-CM | POA: Diagnosis not present

## 2015-10-22 DIAGNOSIS — I77811 Abdominal aortic ectasia: Secondary | ICD-10-CM | POA: Diagnosis not present

## 2015-10-22 DIAGNOSIS — Z136 Encounter for screening for cardiovascular disorders: Secondary | ICD-10-CM | POA: Insufficient documentation

## 2015-10-22 DIAGNOSIS — Z87891 Personal history of nicotine dependence: Secondary | ICD-10-CM | POA: Diagnosis not present

## 2015-10-22 DIAGNOSIS — I10 Essential (primary) hypertension: Secondary | ICD-10-CM | POA: Diagnosis not present

## 2015-10-22 NOTE — Patient Instructions (Signed)
Your physician has recommended you make the following change in your medication: STOP  PLAVIX  Your physician has requested that you have an abdominal aorta duplex. During this test, an ultrasound is used to evaluate the aorta. Allow 30 minutes for this exam. Do not eat after midnight the day before and avoid carbonated beverages Your physician wants you to follow-up in:   [place] will receive a reminder letter in the mail two months in advance. If you don't receive a letter, please call our office to schedule the follow-up appointment.

## 2015-10-24 ENCOUNTER — Ambulatory Visit (HOSPITAL_BASED_OUTPATIENT_CLINIC_OR_DEPARTMENT_OTHER): Payer: Medicare Other

## 2015-10-29 ENCOUNTER — Other Ambulatory Visit: Payer: Self-pay | Admitting: *Deleted

## 2015-11-17 DIAGNOSIS — M5030 Other cervical disc degeneration, unspecified cervical region: Secondary | ICD-10-CM | POA: Diagnosis not present

## 2015-11-17 DIAGNOSIS — E785 Hyperlipidemia, unspecified: Secondary | ICD-10-CM | POA: Diagnosis not present

## 2015-11-17 DIAGNOSIS — I10 Essential (primary) hypertension: Secondary | ICD-10-CM | POA: Diagnosis not present

## 2015-11-17 DIAGNOSIS — R7309 Other abnormal glucose: Secondary | ICD-10-CM | POA: Diagnosis not present

## 2015-11-17 DIAGNOSIS — E039 Hypothyroidism, unspecified: Secondary | ICD-10-CM | POA: Diagnosis not present

## 2015-11-17 DIAGNOSIS — M2550 Pain in unspecified joint: Secondary | ICD-10-CM | POA: Diagnosis not present

## 2015-11-17 DIAGNOSIS — Z23 Encounter for immunization: Secondary | ICD-10-CM | POA: Diagnosis not present

## 2016-03-06 DIAGNOSIS — M2550 Pain in unspecified joint: Secondary | ICD-10-CM | POA: Diagnosis not present

## 2016-03-06 DIAGNOSIS — M79641 Pain in right hand: Secondary | ICD-10-CM | POA: Diagnosis not present

## 2016-03-06 DIAGNOSIS — M79642 Pain in left hand: Secondary | ICD-10-CM | POA: Diagnosis not present

## 2016-03-06 DIAGNOSIS — M542 Cervicalgia: Secondary | ICD-10-CM | POA: Diagnosis not present

## 2016-04-30 DIAGNOSIS — K6289 Other specified diseases of anus and rectum: Secondary | ICD-10-CM | POA: Diagnosis not present

## 2016-04-30 DIAGNOSIS — I10 Essential (primary) hypertension: Secondary | ICD-10-CM | POA: Diagnosis not present

## 2016-04-30 DIAGNOSIS — E1169 Type 2 diabetes mellitus with other specified complication: Secondary | ICD-10-CM | POA: Diagnosis not present

## 2016-04-30 DIAGNOSIS — R351 Nocturia: Secondary | ICD-10-CM | POA: Diagnosis not present

## 2016-04-30 DIAGNOSIS — Z1321 Encounter for screening for nutritional disorder: Secondary | ICD-10-CM | POA: Diagnosis not present

## 2016-04-30 DIAGNOSIS — Z125 Encounter for screening for malignant neoplasm of prostate: Secondary | ICD-10-CM | POA: Diagnosis not present

## 2016-04-30 DIAGNOSIS — Z1212 Encounter for screening for malignant neoplasm of rectum: Secondary | ICD-10-CM | POA: Diagnosis not present

## 2016-04-30 DIAGNOSIS — G4700 Insomnia, unspecified: Secondary | ICD-10-CM | POA: Diagnosis not present

## 2016-04-30 DIAGNOSIS — E039 Hypothyroidism, unspecified: Secondary | ICD-10-CM | POA: Diagnosis not present

## 2016-04-30 DIAGNOSIS — E785 Hyperlipidemia, unspecified: Secondary | ICD-10-CM | POA: Diagnosis not present

## 2016-04-30 DIAGNOSIS — Z1211 Encounter for screening for malignant neoplasm of colon: Secondary | ICD-10-CM | POA: Diagnosis not present

## 2016-05-10 ENCOUNTER — Encounter: Payer: Self-pay | Admitting: Internal Medicine

## 2016-06-23 NOTE — Progress Notes (Signed)
HPI: Follow-up coronary artery disease. [place] he was admitted with chest pain and ruled out. His symptoms were felt to be atypical. Nuclear study showed an ejection fraction of 58%. No infarct or ischemia. Abd ultrasound 8/17 showed ectatic aorta measuring 2.8 cm and fu recommended 5 years. Since last seen patient denies dyspnea, chest pain, palpitations or syncope. Occasional fatigue.   Current Outpatient Prescriptions  Medication Sig Dispense Refill  . acetaminophen (TYLENOL) 500 MG tablet Take 500 mg by mouth 2 (two) times daily.    Marland Kitchen amLODipine (NORVASC) 10 MG tablet Take 10 mg by mouth daily.    Marland Kitchen aspirin EC 81 MG tablet Take 81 mg by mouth daily.    Marland Kitchen ibuprofen (ADVIL,MOTRIN) 200 MG tablet Take 200 mg by mouth every 6 (six) hours as needed.    Marland Kitchen levothyroxine (SYNTHROID, LEVOTHROID) 137 MCG tablet Take 137 mcg by mouth daily before breakfast.    . LIVALO 2 MG TABS Take 1 tablet by mouth every evening.  1  . metoprolol succinate (TOPROL-XL) 25 MG 24 hr tablet Take 1 tablet (25 mg total) by mouth daily. 30 tablet 11  . OVER THE COUNTER MEDICATION Super Beta Prostate    . zolpidem (AMBIEN) 10 MG tablet Take 10 mg by mouth at bedtime as needed for sleep.     No current facility-administered medications for this visit.      Past Medical History:  Diagnosis Date  . Allergy   . Arthritis   . Coronary artery disease   . High cholesterol   . Hypertension   . Hypothyroid   . Myocardial infarction [facility])     Past Surgical History:  Procedure Laterality Date  . CORONARY ARTERY BYPASS GRAFT  2014   [place]  . DENTAL RESTORATION/EXTRACTION WITH X-RAY      Social History   Social History  . Marital status: Widowed    Spouse name: N/A  . Number of children: 2  . Years of education: N/A   Occupational History  . Furniture Warehouse    Social History Main Topics  . Smoking status: Former Smoker   Types: Cigarettes  . Smokeless tobacco: Never Used     Comment: 2003 quit  . Alcohol use 0.0 oz/week     Comment: occasional  . Drug use: No  . Sexual activity: Not on file   Other Topics Concern  . Not on file   Social History Narrative  . No narrative on file    Family History  Problem Relation Age of Onset  . Colon cancer Neg Hx     ROS: Arthralgias but no fevers or chills, productive cough, hemoptysis, dysphasia, odynophagia, melena, hematochezia, dysuria, hematuria, rash, seizure activity, orthopnea, PND, pedal edema, claudication. Remaining systems are negative.  Physical Exam: Well-developed well-nourished in no acute distress.  Skin is warm and dry.  HEENT is normal.  Neck is supple. No bruits  Chest is clear to auscultation with normal expansion.  Cardiovascular exam is regular rate and rhythm. no murmurs Abdominal exam nontender or distended. No masses palpated. Extremities show no edema. neuro grossly intact  ECG-sinus rhythm at a rate of 58. Occasional PVC. Inferior infarct. personally reviewed  A/P  1 coronary artery disease-continue aspirin and statin. No recent chest pain. Patient states his gastroenterologist requested clearance for colonoscopy and there is no contraindication from a cardiac standpoint.   2 hypertension-blood pressure is controlled. Continue  present medications.  3 hyperlipidemia-recent LDL 77; increase livalo to 2 mg daily; lipids and liver 4 weeks  4 abdominal aortic aneurysm-previous ultrasound showed ectatic aorta. I will plan to repeat his study in August 2019.   Kirk Ruths, MD

## 2016-06-24 ENCOUNTER — Ambulatory Visit (AMBULATORY_SURGERY_CENTER): Payer: Self-pay

## 2016-06-24 VITALS — Ht 71.0 in | Wt 233.0 lb

## 2016-06-24 DIAGNOSIS — Z1211 Encounter for screening for malignant neoplasm of colon: Secondary | ICD-10-CM

## 2016-06-24 MED ORDER — SUPREP BOWEL PREP KIT 17.5-3.13-1.6 GM/177ML PO SOLN: 1.0000 | Freq: Once | ORAL | 0 refills | Status: AC

## 2016-06-24 NOTE — Progress Notes (Signed)
No allergies to eggs or soy No past problems with anesthesia No diet meds No home oxygen  Registered emmi 

## 2016-06-28 ENCOUNTER — Telehealth: Payer: Self-pay | Admitting: Internal Medicine

## 2016-06-28 NOTE — Telephone Encounter (Signed)
Called pt back.  He is very concerned about his BPH and subsequent nocturia.  Encouraged pt to consult his PCP regarding this matter and possibly starting on tamsulosin 0.4mg  tabs Angela/PV

## 2016-06-30 ENCOUNTER — Encounter: Payer: Self-pay | Admitting: Cardiology

## 2016-06-30 ENCOUNTER — Ambulatory Visit (INDEPENDENT_AMBULATORY_CARE_PROVIDER_SITE_OTHER): Payer: Medicare Other | Admitting: Cardiology

## 2016-06-30 VITALS — BP 131/70 | HR 58 | Ht 71.0 in | Wt 233.0 lb

## 2016-06-30 DIAGNOSIS — E7800 Pure hypercholesterolemia, unspecified: Secondary | ICD-10-CM | POA: Diagnosis not present

## 2016-06-30 DIAGNOSIS — I714 Abdominal aortic aneurysm, without rupture, unspecified: Secondary | ICD-10-CM

## 2016-06-30 DIAGNOSIS — I10 Essential (primary) hypertension: Secondary | ICD-10-CM | POA: Diagnosis not present

## 2016-06-30 DIAGNOSIS — I25810 Atherosclerosis of coronary artery bypass graft(s) without angina pectoris: Secondary | ICD-10-CM | POA: Diagnosis not present

## 2016-06-30 MED ORDER — PITAVASTATIN CALCIUM 4 MG PO TABS: 4.0000 mg | ORAL_TABLET | Freq: Every day | ORAL | 3 refills | Status: DC

## 2016-06-30 NOTE — Patient Instructions (Signed)
Medication Instructions:   INCREASE LIVALO TO 4 MG ONCE DAILY= 2 OF THE 2 MG TABLETS ONCE DAILY  Labwork:  Your physician recommends that you return for lab work in: 6 WEEKS= WEEK OF June THE 18TH= DO NOT EAT PRIOR TO LAB WORK  Follow-Up:  Your physician wants you to follow-up in: [place] will receive a reminder letter in the mail two months in advance. If you don't receive a letter, please call our office to schedule the follow-up appointment.   If you need a refill on your cardiac medications before your next appointment, please call your pharmacy.

## 2016-07-08 ENCOUNTER — Encounter: Payer: Self-pay | Admitting: Internal Medicine

## 2016-07-08 ENCOUNTER — Ambulatory Visit (AMBULATORY_SURGERY_CENTER): Payer: Medicare Other | Admitting: Internal Medicine

## 2016-07-08 VITALS — BP 125/65 | HR 54 | Temp 96.6°F | Resp 21 | Ht 71.0 in | Wt 233.0 lb

## 2016-07-08 DIAGNOSIS — I10 Essential (primary) hypertension: Secondary | ICD-10-CM | POA: Diagnosis not present

## 2016-07-08 DIAGNOSIS — Z1211 Encounter for screening for malignant neoplasm of colon: Secondary | ICD-10-CM | POA: Diagnosis present

## 2016-07-08 DIAGNOSIS — D125 Benign neoplasm of sigmoid colon: Secondary | ICD-10-CM | POA: Diagnosis not present

## 2016-07-08 DIAGNOSIS — I2510 Atherosclerotic heart disease of native coronary artery without angina pectoris: Secondary | ICD-10-CM | POA: Diagnosis not present

## 2016-07-08 DIAGNOSIS — Z1212 Encounter for screening for malignant neoplasm of rectum: Secondary | ICD-10-CM | POA: Diagnosis not present

## 2016-07-08 DIAGNOSIS — D124 Benign neoplasm of descending colon: Secondary | ICD-10-CM

## 2016-07-08 DIAGNOSIS — I252 Old myocardial infarction: Secondary | ICD-10-CM | POA: Diagnosis not present

## 2016-07-08 MED ORDER — SODIUM CHLORIDE 0.9 % IV SOLN: 500.0000 mL | INTRAVENOUS | Status: DC

## 2016-07-08 NOTE — Progress Notes (Signed)
TO PACU pt awake and alert. Report to RN

## 2016-07-08 NOTE — Op Note (Signed)
[facility] Patient Name: Jeffrey Rhodes Procedure Date: 07/08/2016 11:21 AM MRN: [id number] Endoscopist: Docia Chuck. Henrene Pastor , MD Age: 76 Referring MD:  Date of Birth: 05/10/1940 Gender: Male Account #: [id number] Procedure:                Colonoscopy, with cold snare polypectomy X2 Indications:              Screening for colorectal malignant neoplasm Medicines:                Monitored Anesthesia Care Procedure:                Pre-Anesthesia Assessment:                           - Prior to the procedure, a History and Physical                            was performed, and patient medications and                            allergies were reviewed. The patient's tolerance of                            previous anesthesia was also reviewed. The risks                            and benefits of the procedure and the sedation                            options and risks were discussed with the patient.                            All questions were answered, and informed consent                            was obtained. Prior Anticoagulants: The patient has                            taken no previous anticoagulant or antiplatelet                            agents. ASA Grade Assessment: II - A patient with                            mild systemic disease. After reviewing the risks                            and benefits, the patient was deemed in                            satisfactory condition to undergo the procedure.                           After obtaining informed consent, the colonoscope  was passed under direct vision. Throughout the                            procedure, the patient's blood pressure, pulse, and                            oxygen saturations were monitored continuously. The                            Colonoscope was introduced through the anus and                            advanced to the the cecum, identified by     appendiceal orifice and ileocecal valve. The                            ileocecal valve, appendiceal orifice, and rectum                            were photographed. The quality of the bowel                            preparation was excellent. The colonoscopy was                            performed without difficulty. The patient tolerated                            the procedure well. The bowel preparation used was                            SUPREP. Scope In: 11:28:24 AM Scope Out: 11:42:25 AM Scope Withdrawal Time: 0 hours 11 minutes 20 seconds  Total Procedure Duration: 0 hours 14 minutes 1 second  Findings:                 Two polyps were found in the sigmoid colon and                            descending colon. The polyps were 2 to 4 mm in                            size. These polyps were removed with a cold snare.                            Resection and retrieval were complete.                           Multiple medium-mouthed diverticula were found in                            the sigmoid colon.                           Internal hemorrhoids were found during  retroflexion.                           The exam was otherwise without abnormality on                            direct and retroflexion views. Complications:            No immediate complications. Estimated blood loss:                            None. Estimated Blood Loss:     Estimated blood loss: none. Impression:               - Two 2 to 4 mm polyps in the sigmoid colon and in                            the descending colon, removed with a cold snare.                            Resected and retrieved.                           - Diverticulosis in the sigmoid colon.                           - Internal hemorrhoids.                           - The examination was otherwise normal on direct                            and retroflexion views. Recommendation:           - Repeat colonoscopy in 5 years for surveillance,                             if polyps adenomatous. Otherwise no routine                            surveillance recommended.                           - Patient has a contact number available for                            emergencies. The signs and symptoms of potential                            delayed complications were discussed with the                            patient. Return to normal activities tomorrow.                            Written discharge instructions were provided to the  patient.                           - Resume previous diet.                           - Continue present medications.                           - Await pathology results. Docia Chuck. Henrene Pastor, MD 07/08/2016 11:48:20 AM This report has been signed electronically.

## 2016-07-08 NOTE — Progress Notes (Signed)
Called to room to assist during endoscopic procedure.  Patient ID and intended procedure confirmed with present staff. Received instructions for my participation in the procedure from the performing physician.  

## 2016-07-09 ENCOUNTER — Telehealth: Payer: Self-pay | Admitting: *Deleted

## 2016-07-09 NOTE — Telephone Encounter (Signed)
  Follow up Call-  Call back number 07/08/2016  Post procedure Call Back phone  # [phone]  Permission to leave phone message Yes  Some recent data might be hidden     Patient questions:  Do you have a fever, pain , or abdominal swelling? No. Pain Score  0 *  Have you tolerated food without any problems? Yes.    Have you been able to return to your normal activities? Yes.    Do you have any questions about your discharge instructions: Diet   No. Medications  No. Follow up visit  No.  Do you have questions or concerns about your Care? Yes.    Actions: * If pain score is 4 or above: No action needed, pain <4.

## 2016-07-11 DIAGNOSIS — W57XXXA Bitten or stung by nonvenomous insect and other nonvenomous arthropods, initial encounter: Secondary | ICD-10-CM | POA: Diagnosis not present

## 2016-07-11 DIAGNOSIS — L539 Erythematous condition, unspecified: Secondary | ICD-10-CM | POA: Diagnosis not present

## 2016-07-13 ENCOUNTER — Encounter: Payer: Self-pay | Admitting: Internal Medicine

## 2016-07-22 DIAGNOSIS — Z1211 Encounter for screening for malignant neoplasm of colon: Secondary | ICD-10-CM | POA: Diagnosis not present

## 2016-07-22 DIAGNOSIS — H6121 Impacted cerumen, right ear: Secondary | ICD-10-CM | POA: Diagnosis not present

## 2016-07-22 DIAGNOSIS — J069 Acute upper respiratory infection, unspecified: Secondary | ICD-10-CM | POA: Diagnosis not present

## 2016-08-21 DIAGNOSIS — R39198 Other difficulties with micturition: Secondary | ICD-10-CM | POA: Diagnosis not present

## 2016-08-21 DIAGNOSIS — M5440 Lumbago with sciatica, unspecified side: Secondary | ICD-10-CM | POA: Diagnosis not present

## 2016-08-23 DIAGNOSIS — I25810 Atherosclerosis of coronary artery bypass graft(s) without angina pectoris: Secondary | ICD-10-CM | POA: Diagnosis not present

## 2016-08-23 DIAGNOSIS — R7309 Other abnormal glucose: Secondary | ICD-10-CM | POA: Diagnosis not present

## 2016-08-24 ENCOUNTER — Encounter: Payer: Self-pay | Admitting: *Deleted

## 2016-08-24 LAB — LIPID PANEL
CHOLESTEROL TOTAL: 132 mg/dL (ref 100–199)
Chol/HDL Ratio: 2.4 ratio (ref 0.0–5.0)
HDL: 56 mg/dL (ref >39)
LDL CALC: 54 mg/dL (ref 0–99)
TRIGLYCERIDES: 112 mg/dL (ref 0–149)
VLDL CHOLESTEROL CAL: 22 mg/dL (ref 5–40)

## 2016-08-24 LAB — HEPATIC FUNCTION PANEL
ALT: 17 IU/L (ref 0–44)
AST: 16 IU/L (ref 0–40)
Albumin: 4.2 g/dL (ref 3.5–4.8)
Alkaline Phosphatase: 61 IU/L (ref 39–117)
Bilirubin Total: 0.2 mg/dL (ref 0.0–1.2)
Bilirubin, Direct: 0.1 mg/dL (ref 0.00–0.40)
Total Protein: 6.8 g/dL (ref 6.0–8.5)

## 2016-09-30 ENCOUNTER — Telehealth: Payer: Self-pay

## 2016-09-30 NOTE — Telephone Encounter (Signed)
Pre visit call completed 

## 2016-10-01 ENCOUNTER — Ambulatory Visit (INDEPENDENT_AMBULATORY_CARE_PROVIDER_SITE_OTHER): Payer: Medicare Other | Admitting: Family Medicine

## 2016-10-01 ENCOUNTER — Encounter: Payer: Self-pay | Admitting: Family Medicine

## 2016-10-01 VITALS — BP 120/70 | HR 53 | Temp 98.2°F | Ht 71.0 in | Wt 222.2 lb

## 2016-10-01 DIAGNOSIS — M545 Low back pain: Secondary | ICD-10-CM | POA: Diagnosis not present

## 2016-10-01 DIAGNOSIS — G4700 Insomnia, unspecified: Secondary | ICD-10-CM

## 2016-10-01 DIAGNOSIS — G8929 Other chronic pain: Secondary | ICD-10-CM | POA: Diagnosis not present

## 2016-10-01 MED ORDER — TRAZODONE HCL 50 MG PO TABS: 25.0000 mg | ORAL_TABLET | Freq: Every evening | ORAL | 3 refills | Status: DC | PRN
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
## 2016-10-01 NOTE — Patient Instructions (Addendum)
Sleep Hygiene Tips:  Do not watch TV or look at screens within 1 hour of going to bed. If you do, make sure there is a blue light filter (nighttime mode) involved.  Try to go to bed around the same time every night. Wake up at the same time within 1 hour of regular time. Ex: If you wake up at 7 AM for work, do not sleep past 8 AM on days that you don't work.  Do not drink alcohol before bedtime.  Do not consume caffeine-containing beverages after noon or within 9 hours of intended bedtime.  Get regular exercise/physical activity in your life, but not within 2 hours of planned bedtime.  Do not take naps.   Do not eat within 2 hours of planned bedtime.  Melatonin, 3-5 mg 30-60 minutes before planned bedtime may be helpful.   The bed should be for sleep or sex only. If after 20-30 minutes you are unable to fall asleep, get up and do something relaxing. Do this until you feel ready to go to sleep again.   Please consider cognitive behavioral therapy. The medical literature and evidence-based guidelines support it. Contact [phone] to schedule an appointment or inquire about cost/insurance coverage.   Heat (pad or rice pillow in microwave) over affected area, 10-15 minutes every 2-3 hours while awake.   OK to take Tylenol 1000 mg (2 extra strength tabs) or 975 mg (3 regular strength tabs) every 6 hours as needed.  EXERCISES  RANGE OF MOTION (ROM) AND STRETCHING EXERCISES - Low Back Sprain Most people with lower back pain will find that their symptoms get worse with excessive bending forward (flexion) or arching at the lower back (extension). The exercises that will help resolve your symptoms will focus on the opposite motion.  Your physician, physical therapist or athletic trainer will help you determine which exercises will be most helpful to resolve your lower back pain. Do not complete any exercises without first consulting with your caregiver. Discontinue any exercises which make  your symptoms worse, until you speak to your caregiver. If you have pain, numbness or tingling which travels down into your buttocks, leg or foot, the goal of the therapy is for these symptoms to move closer to your back and eventually resolve. Sometimes, these leg symptoms will get better, but your lower back pain may worsen. This is often an indication of progress in your rehabilitation. Be very alert to any changes in your symptoms and the activities in which you participated in the 24 hours prior to the change. Sharing this information with your caregiver will allow him or her to most efficiently treat your condition. These exercises may help you when beginning to rehabilitate your injury. Your symptoms may resolve with or without further involvement from your physician, physical therapist or athletic trainer. While completing these exercises, remember:   Restoring tissue flexibility helps normal motion to return to the joints. This allows healthier, less painful movement and activity.  An effective stretch should be held for at least 30 seconds.  A stretch should never be painful. You should only feel a gentle lengthening or release in the stretched tissue. FLEXION RANGE OF MOTION AND STRETCHING EXERCISES:  STRETCH - Flexion, Single Knee to Chest   Lie on a firm bed or floor with both legs extended in front of you.  Keeping one leg in contact with the floor, bring your opposite knee to your chest. Hold your leg in place by either grabbing behind your thigh or  at your knee.  Pull until you feel a gentle stretch in your low back. Hold 15-20 seconds.  Slowly release your grasp and repeat the exercise with the opposite side. Repeat 2 times. Complete this exercise 1-2 times per day.   STRETCH - Flexion, Double Knee to Chest  Lie on a firm bed or floor with both legs extended in front of you.  Keeping one leg in contact with the floor, bring your opposite knee to your chest.  Tense your  stomach muscles to support your back and then lift your other knee to your chest. Hold your legs in place by either grabbing behind your thighs or at your knees.  Pull both knees toward your chest until you feel a gentle stretch in your low back. Hold 15-20 seconds.  Tense your stomach muscles and slowly return one leg at a time to the floor. Repeat 2 times. Complete this exercise 1-2 times per day.   STRETCH - Low Trunk Rotation  Lie on a firm bed or floor. Keeping your legs in front of you, bend your knees so they are both pointed toward the ceiling and your feet are flat on the floor.  Extend your arms out to the side. This will stabilize your upper body by keeping your shoulders in contact with the floor.  Gently and slowly drop both knees together to one side until you feel a gentle stretch in your low back. Hold for 15-20 seconds.  Tense your stomach muscles to support your lower back as you bring your knees back to the starting position. Repeat the exercise to the other side. Repeat 2 times. Complete this exercise 1-2 times per day  EXTENSION RANGE OF MOTION AND FLEXIBILITY EXERCISES:  STRETCH - Extension, Prone on Elbows   Lie on your stomach on the floor, a bed will be too soft. Place your palms about shoulder width apart and at the height of your head.  Place your elbows under your shoulders. If this is too painful, stack pillows under your chest.  Allow your body to relax so that your hips drop lower and make contact more completely with the floor.  Hold this position for 15-20 seconds.  Slowly return to lying flat on the floor. Repeat 2 times. Complete this exercise 1-2 times per day.   RANGE OF MOTION - Extension, Prone Press Ups  Lie on your stomach on the floor, a bed will be too soft. Place your palms about shoulder width apart and at the height of your head.  Keeping your back as relaxed as possible, slowly straighten your elbows while keeping your hips on the  floor. You may adjust the placement of your hands to maximize your comfort. As you gain motion, your hands will come more underneath your shoulders.  Hold this position 15-20 seconds.  Slowly return to lying flat on the floor. Repeat 2 times. Complete this exercise 1-2 times per day.   RANGE OF MOTION- Quadruped, Neutral Spine   Assume a hands and knees position on a firm surface. Keep your hands under your shoulders and your knees under your hips. You may place padding under your knees for comfort.  Drop your head and point your tailbone toward the ground below you. This will round out your lower back like an angry cat. Hold this position for 15-20 seconds.  Slowly lift your head and release your tail bone so that your back sags into a large arch, like an old horse.  Hold this position  for 15-20 seconds.  Repeat this until you feel limber in your low back.  Now, find your "sweet spot." This will be the most comfortable position somewhere between the two previous positions. This is your neutral spine. Once you have found this position, tense your stomach muscles to support your low back.  Hold this position for 15-20 seconds. Repeat 2 times. Complete this exercise 1-2 times per day.  STRENGTHENING EXERCISES - Low Back Sprain These exercises may help you when beginning to rehabilitate your injury. These exercises should be done near your "sweet spot." This is the neutral, low-back arch, somewhere between fully rounded and fully arched, that is your least painful position. When performed in this safe range of motion, these exercises can be used for people who have either a flexion or extension based injury. These exercises may resolve your symptoms with or without further involvement from your physician, physical therapist or athletic trainer. While completing these exercises, remember:   Muscles can gain both the endurance and the strength needed for everyday activities through controlled  exercises.  Complete these exercises as instructed by your physician, physical therapist or athletic trainer. Increase the resistance and repetitions only as guided.  You may experience muscle soreness or fatigue, but the pain or discomfort you are trying to eliminate should never worsen during these exercises. If this pain does worsen, stop and make certain you are following the directions exactly. If the pain is still present after adjustments, discontinue the exercise until you can discuss the trouble with your caregiver.  STRENGTHENING - Deep Abdominals, Pelvic Tilt   Lie on a firm bed or floor. Keeping your legs in front of you, bend your knees so they are both pointed toward the ceiling and your feet are flat on the floor.  Tense your lower abdominal muscles to press your low back into the floor. This motion will rotate your pelvis so that your tail bone is scooping upwards rather than pointing at your feet or into the floor. With a gentle tension and even breathing, hold this position for 10-15 seconds. Repeat 2 times. Complete this exercise 1 time per day.   STRENGTHENING - Abdominals, Crunches   Lie on a firm bed or floor. Keeping your legs in front of you, bend your knees so they are both pointed toward the ceiling and your feet are flat on the floor. Cross your arms over your chest.  Slightly tip your chin down without bending your neck.  Tense your abdominals and slowly lift your trunk high enough to just clear your shoulder blades. Lifting higher can put excessive stress on the lower back and does not further strengthen your abdominal muscles.  Control your return to the starting position. Repeat 2 times. Complete this exercise once every 1-2 days.   STRENGTHENING - Quadruped, Opposite UE/LE Lift   Assume a hands and knees position on a firm surface. Keep your hands under your shoulders and your knees under your hips. You may place padding under your knees for comfort.  Find  your neutral spine and gently tense your abdominal muscles so that you can maintain this position. Your shoulders and hips should form a rectangle that is parallel with the floor and is not twisted.  Keeping your trunk steady, lift your right hand no higher than your shoulder and then your left leg no higher than your hip. Make sure you are not holding your breath. Hold this position for 15-20 seconds.  Continuing to keep your abdominal muscles  tense and your back steady, slowly return to your starting position. Repeat with the opposite arm and leg. Repeat 2 times. Complete this exercise once every 1-2 days.   STRENGTHENING - Abdominals and Quadriceps, Straight Leg Raise   Lie on a firm bed or floor with both legs extended in front of you.  Keeping one leg in contact with the floor, bend the other knee so that your foot can rest flat on the floor.  Find your neutral spine, and tense your abdominal muscles to maintain your spinal position throughout the exercise.  Slowly lift your straight leg off the floor about 6 inches for a count of 15, making sure to not hold your breath.  Still keeping your neutral spine, slowly lower your leg all the way to the floor. Repeat this exercise with each leg 2 times. Complete this exercise once every 1-2 days. POSTURE AND BODY MECHANICS CONSIDERATIONS - Low Back Sprain Keeping correct posture when sitting, standing or completing your activities will reduce the stress put on different body tissues, allowing injured tissues a chance to heal and limiting painful experiences. The following are general guidelines for improved posture. Your physician or physical therapist will provide you with any instructions specific to your needs. While reading these guidelines, remember:  The exercises prescribed by your provider will help you have the flexibility and strength to maintain correct postures.  The correct posture provides the best environment for your joints to  work. All of your joints have less wear and tear when properly supported by a spine with good posture. This means you will experience a healthier, less painful body.  Correct posture must be practiced with all of your activities, especially prolonged sitting and standing. Correct posture is as important when doing repetitive low-stress activities (typing) as it is when doing a single heavy-load activity (lifting).  RESTING POSITIONS Consider which positions are most painful for you when choosing a resting position. If you have pain with flexion-based activities (sitting, bending, stooping, squatting), choose a position that allows you to rest in a less flexed posture. You would want to avoid curling into a fetal position on your side. If your pain worsens with extension-based activities (prolonged standing, working overhead), avoid resting in an extended position such as sleeping on your stomach. Most people will find more comfort when they rest with their spine in a more neutral position, neither too rounded nor too arched. Lying on a non-sagging bed on your side with a pillow between your knees, or on your back with a pillow under your knees will often provide some relief. Keep in mind, being in any one position for a prolonged period of time, no matter how correct your posture, can still lead to stiffness. PROPER SITTING POSTURE In order to minimize stress and discomfort on your spine, you must sit with correct posture. Sitting with good posture should be effortless for a healthy body. Returning to good posture is a gradual process. Many people can work toward this most comfortably by using various supports until they have the flexibility and strength to maintain this posture on their own. When sitting with proper posture, your ears will fall over your shoulders and your shoulders will fall over your hips. You should use the back of the chair to support your upper back. Your lower back will be in a  neutral position, just slightly arched. You may place a small pillow or folded towel at the base of your lower back for  support.  When  working at [employer], create an environment that supports good, upright posture. Without extra support, muscles tire, which leads to excessive strain on joints and other tissues. Keep these recommendations in mind:  CHAIR:  A chair should be able to slide under your desk when your back makes contact with the back of the chair. This allows you to work closely.  The chair's height should allow your eyes to be level with the upper part of your monitor and your hands to be slightly lower than your elbows.  BODY POSITION  Your feet should make contact with the floor. If this is not possible, use a foot rest.  Keep your ears over your shoulders. This will reduce stress on your neck and low back.  INCORRECT SITTING POSTURES  If you are feeling tired and unable to assume a healthy sitting posture, do not slouch or slump. This puts excessive strain on your back tissues, causing more damage and pain. Healthier options include:  Using more support, like a lumbar pillow.  Switching tasks to something that requires you to be upright or walking.  Talking a brief walk.  Lying down to rest in a neutral-spine position.  PROLONGED STANDING WHILE SLIGHTLY LEANING FORWARD  When completing a task that requires you to lean forward while standing in one place for a long time, place either foot up on a stationary 2-4 inch high object to help maintain the best posture. When both feet are on the ground, the lower back tends to lose its slight inward curve. If this curve flattens (or becomes too large), then the back and your other joints will experience too much stress, tire more quickly, and can cause pain.  CORRECT STANDING POSTURES Proper standing posture should be assumed with all daily activities, even if they only take a few moments, like when brushing your teeth. As in  sitting, your ears should fall over your shoulders and your shoulders should fall over your hips. You should keep a slight tension in your abdominal muscles to brace your spine. Your tailbone should point down to the ground, not behind your body, resulting in an over-extended swayback posture.   INCORRECT STANDING POSTURES  Common incorrect standing postures include a forward head, locked knees and/or an excessive swayback. WALKING Walk with an upright posture. Your ears, shoulders and hips should all line-up.  PROLONGED ACTIVITY IN A FLEXED POSITION When completing a task that requires you to bend forward at your waist or lean over a low surface, try to find a way to stabilize 3 out of 4 of your limbs. You can place a hand or elbow on your thigh or rest a knee on the surface you are reaching across. This will provide you more stability, so that your muscles do not tire as quickly. By keeping your knees relaxed, or slightly bent, you will also reduce stress across your lower back. CORRECT LIFTING TECHNIQUES  DO :  Assume a wide stance. This will provide you more stability and the opportunity to get as close as possible to the object which you are lifting.  Tense your abdominals to brace your spine. Bend at the knees and hips. Keeping your back locked in a neutral-spine position, lift using your leg muscles. Lift with your legs, keeping your back straight.  Test the weight of unknown objects before attempting to lift them.  Try to keep your elbows locked down at your sides in order get the best strength from your shoulders when carrying an object.  Always ask for help when lifting heavy or awkward objects. INCORRECT LIFTING TECHNIQUES DO NOT:   Lock your knees when lifting, even if it is a small object.  Bend and twist. Pivot at your feet or move your feet when needing to change directions.  Assume that you can safely pick up even a paperclip without proper posture.

## 2016-10-20 ENCOUNTER — Telehealth: Payer: Self-pay | Admitting: *Deleted

## 2016-10-20 NOTE — Telephone Encounter (Signed)
Received Medical records from [place]; forwarded to provider/SLS 08/29

## 2016-10-27 ENCOUNTER — Telehealth: Payer: Self-pay | Admitting: Family Medicine

## 2016-10-27 NOTE — Telephone Encounter (Signed)
Reviewed records- prediabetes, hyperlipidemia, AAA screening in 2017, colonoscopy 2007, refused most recent OV, but had positive FOBT. PCV 2017, unsure which one. Will have to check records. Td 2017.

## 2016-11-30 DIAGNOSIS — Z23 Encounter for immunization: Secondary | ICD-10-CM | POA: Diagnosis not present

## 2016-12-09 DIAGNOSIS — H40029 Open angle with borderline findings, high risk, unspecified eye: Secondary | ICD-10-CM | POA: Diagnosis not present

## 2016-12-20 ENCOUNTER — Ambulatory Visit (INDEPENDENT_AMBULATORY_CARE_PROVIDER_SITE_OTHER): Payer: Medicare Other | Admitting: Family Medicine

## 2016-12-20 ENCOUNTER — Encounter: Payer: Self-pay | Admitting: Family Medicine

## 2016-12-20 VITALS — BP 130/74 | HR 57 | Temp 98.2°F | Ht 71.0 in | Wt 219.2 lb

## 2016-12-20 DIAGNOSIS — B351 Tinea unguium: Secondary | ICD-10-CM

## 2016-12-20 DIAGNOSIS — M25511 Pain in right shoulder: Secondary | ICD-10-CM

## 2016-12-20 DIAGNOSIS — I10 Essential (primary) hypertension: Secondary | ICD-10-CM | POA: Diagnosis not present

## 2016-12-20 DIAGNOSIS — M545 Low back pain: Secondary | ICD-10-CM

## 2016-12-20 DIAGNOSIS — G4700 Insomnia, unspecified: Secondary | ICD-10-CM | POA: Diagnosis not present

## 2016-12-20 DIAGNOSIS — G8929 Other chronic pain: Secondary | ICD-10-CM

## 2016-12-20 MED ORDER — TRAMADOL HCL 50 MG PO TABS: 50.0000 mg | ORAL_TABLET | Freq: Two times a day (BID) | ORAL | 0 refills | Status: DC | PRN

## 2016-12-20 MED ORDER — AMLODIPINE BESYLATE 10 MG PO TABS: 10.0000 mg | ORAL_TABLET | Freq: Every day | ORAL | 1 refills | Status: DC

## 2016-12-20 MED ORDER — METHYLPREDNISOLONE ACETATE 40 MG/ML IJ SUSP
40.0000 mg | Freq: Once | INTRAMUSCULAR | Status: AC
  Administered 2016-12-20: 40 mg via INTRAMUSCULAR

## 2016-12-20 MED ORDER — METOPROLOL SUCCINATE ER 25 MG PO TB24: 25.0000 mg | ORAL_TABLET | Freq: Every day | ORAL | 1 refills | Status: DC

## 2016-12-20 MED ORDER — DOXEPIN HCL 10 MG PO CAPS: 10.0000 mg | ORAL_CAPSULE | Freq: Every evening | ORAL | 2 refills | Status: DC | PRN

## 2016-12-20 NOTE — Patient Instructions (Addendum)
Sleep is important to [place] all. Getting good sleep is imperative to adequate functioning during the day. Work with our counselors who are trained to help people obtain quality sleep. Call [phone] to schedule an appointment or if you are curious about insurance coverage/cost.  If you do not hear anything about your referral in the next 1-2 weeks, call our office and ask for an update.  Sleep Hygiene Tips:  Do not watch TV or look at screens within 1 hour of going to bed. If you do, make sure there is a blue light filter (nighttime mode) involved.  Try to go to bed around the same time every night. Wake up at the same time within 1 hour of regular time. Ex: If you wake up at 7 AM for work, do not sleep past 8 AM on days that you don't work.  Do not drink alcohol before bedtime.  Do not consume caffeine-containing beverages after noon or within 9 hours of intended bedtime.  Get regular exercise/physical activity in your life, but not within 2 hours of planned bedtime.  Do not take naps.   Do not eat within 2 hours of planned bedtime.  Melatonin, 3-5 mg 30-60 minutes before planned bedtime may be helpful.   The bed should be for sleep or sex only. If after 20-30 minutes you are unable to fall asleep, get up and do something relaxing. Do this until you feel ready to go to sleep again.   Topical Vick's over area on toenails can be helpful.

## 2016-12-20 NOTE — Progress Notes (Signed)
Chief Complaint  Patient presents with  . Follow-up    Subjective: Patient is a 76 y.o. male here for med follow up.  He was started on trazodone and I gave him palpitations, not quite helpful for his sleep.  He had forgotten that he had taken around 4-5 years ago and did not do well.  He has not called the counselor.  Has been going on for around 10 years.  He continues to have middle and left-sided low back pain. He takes tramadol intermittently. No red flag s/s's.   Shoulder pain worsened with weather change recently. ROM slightly decreased, had cortisone injection in past that was helpful.   Fungus on nails, no pain. Has not tried anything OTC.   ROS: Psych: +insomnia MSK: +LBP  Family History  Problem Relation Age of Onset  . Breast cancer Mother   . Diabetes Brother   . Heart disease Brother   . Colon cancer Neg Hx    Past Medical History:  Diagnosis Date  . Allergy   . Arthritis   . Coronary artery disease   . High cholesterol   . Hypertension   . Hypothyroid   . Myocardial infarction (HCC)    Allergies  Allergen Reactions  . Bee Venom Other (See Comments)    unknown  . Lisinopril     cough Other reaction(s): COUGH Other reaction(s): COUGH cough Other reaction(s): COUGH  . Losartan Cough    Current Outpatient Prescriptions:  .  acetaminophen (TYLENOL) 500 MG tablet, Take 500 mg by mouth 2 (two) times daily., Disp: , Rfl:  .  amLODipine (NORVASC) 10 MG tablet, Take 10 mg by mouth daily., Disp: , Rfl:  .  aspirin EC 81 MG tablet, Take 81 mg by mouth daily., Disp: , Rfl:  .  ibuprofen (ADVIL,MOTRIN) 200 MG tablet, Take 200 mg by mouth every 6 (six) hours as needed., Disp: , Rfl:  .  latanoprost (XALATAN) 0.005 % ophthalmic solution, Place 1 drop into both eyes at bedtime., Disp: , Rfl:  .  levothyroxine (SYNTHROID, LEVOTHROID) 137 MCG tablet, Take 137 mcg by mouth daily before breakfast., Disp: , Rfl:  .  metoprolol succinate (TOPROL-XL) 25 MG 24 hr  tablet, Take 1 tablet (25 mg total) by mouth daily., Disp: 30 tablet, Rfl: 11 .  OVER THE COUNTER MEDICATION, Super Beta Prostate, Disp: , Rfl:  .  Pitavastatin Calcium (LIVALO) 4 MG TABS, Take 1 tablet (4 mg total) by mouth daily., Disp: 90 tablet, Rfl: 3 .  tamsulosin (FLOMAX) 0.4 MG CAPS capsule, Take 0.4 mg by mouth daily., Disp: , Rfl: 1  Objective: BP 130/74 (BP Location: Left Arm, Patient Position: Sitting, Cuff Size: Large)   Pulse (!) 57   Temp 98.2 F (36.8 C) (Oral)   Ht 5\' 11"  (1.803 m)   Wt 219 lb 4 oz (99.5 kg)   SpO2 97%   BMI 30.58 kg/m  General: Awake, appears stated age HEENT: MMM, EOMi Heart: RRR, no murmurs Lungs: CTAB, no rales, wheezes or rhonchi. No accessory muscle use MSK: +TTP over midline and L lumbar paraspinal msc; neg straight leg/lesegue b/l, poor hamstring ROM; neg Neer's, +empty can on R and Neer's Neuro: DTR's equal and symmetric UE's and LE's b/l, no cerebellar signs Psych: Age appropriate judgment and insight, normal affect and mood  Procedure Note; Shoulder bursa injection Verbal consent obtained. The area was palpated, an area was marked just caudal to the acromion process laterally, and cleaned with alcohol x1. A 27-gauge needle  was used to enter the joint laterally with ease. 40 mg of Depomedrol with 1 mL of 1% lidocaine was injected. The patient tolerated the procedure well. There were no complications noted.  Assessment and Plan: Insomnia, unspecified type - Plan: doxepin (SINEQUAN) 10 MG capsule  Chronic right shoulder pain - Plan: PR DRAIN/INJECT LARGE JOINT/BURSA, methylPREDNISolone acetate (DEPO-MEDROL) injection 40 mg  Chronic bilateral low back pain without sciatica - Plan: Ambulatory referral to Physical Therapy, methylPREDNISolone acetate (DEPO-MEDROL) injection 40 mg  Onychomycosis  Essential hypertension, benign - Plan: metoprolol succinate (TOPROL-XL) 25 MG 24 hr tablet, amLODipine (NORVASC) 10 MG tablet  Orders as  above. Really tried pushed for pt to contact number for CBT. He is in agreement for this. Try low dose doxepin. If no help, will try Belsomra.  Refer PT, if no better will refer to PM&R vs MRI.  Topical Vick's for toenail.  F/u in 4 weeks to recheck sleep.  The patient voiced understanding and agreement to the plan.  Howells, DO 12/20/16  3:56 PM

## 2016-12-20 NOTE — Progress Notes (Signed)
Pre visit review using our clinic review tool, if applicable. No additional management support is needed unless otherwise documented below in the visit note. 

## 2016-12-27 ENCOUNTER — Ambulatory Visit: Payer: Medicare Other | Admitting: Physical Therapy

## 2016-12-30 ENCOUNTER — Telehealth: Payer: Self-pay | Admitting: Family Medicine

## 2016-12-30 NOTE — Telephone Encounter (Signed)
Pt uses [facility] on [address]. Pt states Doxepin is NOT working. Please call in something different per pt request.

## 2017-01-03 MED ORDER — RAMELTEON 8 MG PO TABS: 8.0000 mg | ORAL_TABLET | Freq: Every day | ORAL | 3 refills | Status: DC

## 2017-01-03 NOTE — Telephone Encounter (Signed)
Updated medication list/sent in new medication /  Patient notified.

## 2017-01-03 NOTE — Telephone Encounter (Signed)
Please order Ramelteon 8 mg within 30 min of bedtime for sleep. Disp 30, ref 2. TY. Will D/C Doxepin.

## 2017-01-07 DIAGNOSIS — H40029 Open angle with borderline findings, high risk, unspecified eye: Secondary | ICD-10-CM | POA: Diagnosis not present

## 2017-01-17 ENCOUNTER — Encounter: Payer: Self-pay | Admitting: Family Medicine

## 2017-01-17 ENCOUNTER — Ambulatory Visit: Payer: Medicare Other | Attending: Family Medicine | Admitting: Physical Therapy

## 2017-01-17 ENCOUNTER — Ambulatory Visit (INDEPENDENT_AMBULATORY_CARE_PROVIDER_SITE_OTHER): Payer: Medicare Other | Admitting: Family Medicine

## 2017-01-17 ENCOUNTER — Other Ambulatory Visit: Payer: Self-pay

## 2017-01-17 VITALS — BP 140/72 | HR 62 | Temp 98.0°F | Ht 71.0 in | Wt 220.5 lb

## 2017-01-17 DIAGNOSIS — R7303 Prediabetes: Secondary | ICD-10-CM

## 2017-01-17 DIAGNOSIS — G8929 Other chronic pain: Secondary | ICD-10-CM | POA: Insufficient documentation

## 2017-01-17 DIAGNOSIS — R29898 Other symptoms and signs involving the musculoskeletal system: Secondary | ICD-10-CM | POA: Insufficient documentation

## 2017-01-17 DIAGNOSIS — M6281 Muscle weakness (generalized): Secondary | ICD-10-CM | POA: Diagnosis not present

## 2017-01-17 DIAGNOSIS — E039 Hypothyroidism, unspecified: Secondary | ICD-10-CM | POA: Diagnosis not present

## 2017-01-17 DIAGNOSIS — I2510 Atherosclerotic heart disease of native coronary artery without angina pectoris: Secondary | ICD-10-CM | POA: Diagnosis not present

## 2017-01-17 DIAGNOSIS — M545 Low back pain: Secondary | ICD-10-CM | POA: Diagnosis not present

## 2017-01-17 DIAGNOSIS — I10 Essential (primary) hypertension: Secondary | ICD-10-CM | POA: Diagnosis not present

## 2017-01-17 DIAGNOSIS — G4700 Insomnia, unspecified: Secondary | ICD-10-CM | POA: Diagnosis not present

## 2017-01-17 MED ORDER — ZOLPIDEM TARTRATE 5 MG PO TABS: 5.0000 mg | ORAL_TABLET | Freq: Every evening | ORAL | 0 refills | Status: DC | PRN

## 2017-01-17 MED ORDER — TAMSULOSIN HCL 0.4 MG PO CAPS: 0.4000 mg | ORAL_CAPSULE | Freq: Every day | ORAL | 1 refills | Status: DC

## 2017-01-17 MED ORDER — MIRTAZAPINE 30 MG PO TABS: 30.0000 mg | ORAL_TABLET | Freq: Every day | ORAL | 1 refills | Status: DC

## 2017-01-17 NOTE — Therapy (Addendum)
[facility] [address]  [place], [place], [address] Phone: [phone]   Fax:  [phone]  Physical Therapy Evaluation  Patient Details  Name: Jeffrey Rhodes MRN: [id number] Date of Birth: [birth year] Referring Provider: Riki Sheer, DO   Encounter Date: 01/17/2017  PT End of Session - 01/17/17 0844    Visit Number  1    Number of Visits  12    Date for PT Re-Evaluation  03/04/17    Authorization Type  Medicare & AARP    PT Start Time  [phone]    PT Stop Time  0952    PT Time Calculation (min)  68 min    Activity Tolerance  Patient tolerated treatment well    Behavior During Therapy  [facility] for tasks assessed/performed       Past Medical History:  Diagnosis Date  . Allergy   . Arthritis   . Coronary artery disease   . High cholesterol   . Hypertension   . Hypothyroid   . Myocardial infarction [facility])     Past Surgical History:  Procedure Laterality Date  . CORONARY ARTERY BYPASS GRAFT  2014   [facility]  . DENTAL RESTORATION/EXTRACTION WITH X-RAY      There were no vitals filed for this visit.   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 Reports being told in the past that he has a "bad disc" in the past. Denies any radicular pain, numbness or tingling.    Limitations  Standing;House hold activities    How long can you stand comfortably?  15-20 minutes    Patient Stated Goals  "to not have no back aches"    Currently in Pain?  Yes    Pain Score  4  up to 9-10/10 at worst    Pain Location  Back    Pain Orientation  Lower;Left;Right    Pain Descriptors / Indicators  Sharp    Pain Type  Acute pain    Pain Radiating Towards  n/a    Pain Onset  More than a month ago    Pain Frequency  Intermittent    Aggravating Factors    uncertain    Pain Relieving Factors  prescription pain meds PRN, heating pad    Effect of Pain on Daily Activities  has to take it easy         [facility] PT Assessment - 01/17/17 0844      Assessment   Medical Diagnosis  Chronic low back pain w/o sciatica    Referring Provider  Riki Sheer, DO    Onset Date/Surgical Date  08/25/16    Next MD Visit  02/28/17    Prior Therapy  none      Balance Screen   Has the patient fallen in the past 6 months  No    Has the patient had a decrease in activity level because of a fear of falling?   No    Is the patient reluctant to leave their home because of a fear of falling?   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    Leisure  house & yard work      Observation/Other Assessments   Focus on Therapeutic Outcomes (FOTO)   62% (38% limitation); predicted 67% (33% limitation)      ROM / Strength   AROM / PROM / Strength  AROM;Strength      AROM   AROM Assessment Site  Lumbar    Lumbar Flexion  hands to mid shins    Lumbar Extension  25%    Lumbar - Right Side Bend  WFL    Lumbar - Left Side Bend  WFL    Lumbar - Right Rotation  75%    Lumbar - Left Rotation  50%      Strength   Strength Assessment Site  Hip;Knee    Right/Left Hip  Right;Left    Right Hip Flexion  4-/5    Right Hip Extension  3+/5    Right Hip External Rotation   4-/5    Right Hip Internal Rotation  4-/5    Right Hip ABduction  4-/5    Right Hip ADduction  3+/5    Left Hip Flexion  4-/5    Left Hip Extension  4-/5    Left Hip External Rotation  4-/5    Left Hip Internal  Rotation  4-/5    Left Hip ABduction  4-/5    Left Hip ADduction  3+/5    Right/Left Knee  Right;Left    Right Knee Flexion  5/5    Right Knee Extension  5/5    Left Knee Flexion  5/5    Left Knee Extension  5/5      Flexibility   Soft Tissue Assessment /Muscle Length  yes    Hamstrings  mild tight B    Quadriceps  mod/severe tight quads & hip flexors B    ITB  mod/severe tight B    Piriformis  very mild tight B             Objective measurements completed on examination: See above findings.      [facility] PT Treatment/Exercise - 01/17/17 0844      Exercises   Exercises  Lumbar      Lumbar Exercises: Stretches   Passive Hamstring Stretch  30 seconds;1 rep    Passive Hamstring Stretch Limitations  supine with strap    Hip Flexor Stretch  30 seconds;1 rep    Hip Flexor Stretch Limitations  mod thomas with strap    ITB Stretch  30 seconds;1 rep    ITB Stretch Limitations  supine with strap      Lumbar Exercises: Supine   Ab Set  10 reps;5 seconds    AB Set Limitations  + pelvic tilt    Bridge  5 reps;5 seconds    Bridge Limitations  + hip adduction ball squeeze      Modalities   Modalities  Electrical Stimulation;Moist Heat      Moist Heat Therapy   Number Minutes Moist Heat  15 Minutes    Moist Heat Location  Lumbar Spine      Electrical Stimulation   Electrical Stimulation Location  lumbar spine  Electrical Stimulation Action  IFC    Electrical Stimulation Parameters  80-150 Hz, intensity to pt tol x15'             PT Education - 01/17/17 0930    Education provided  Yes    Education Details  PT eval findings, anticipated POC & initial HEP    Person(s) Educated  Patient    Methods  Explanation;Demonstration;Handout    Comprehension  Verbalized understanding;Returned demonstration;Need further instruction       PT Short Term Goals - 01/17/17 0952      PT SHORT TERM GOAL #1   Title  Independent witth initial HEP    Status  New    Target  Date  02/04/17        PT Long Term Goals - 01/17/17 1306      PT LONG TERM GOAL #1   Title  Independent with ongoing HEP +/- gym program    Status  New    Target Date  03/04/17      PT LONG TERM GOAL #2   Title  Lumbar ROM WFL w/o increased pain    Status  New    Target Date  03/04/17      PT LONG TERM GOAL #3   Title  B proximal LE strength >/= 4/5 to 4+/5    Status  New    Target Date  03/04/17      PT LONG TERM GOAL #4   Title  Pt will verbalize/demonstrate understanding of good posture and body mechanics with typical daily tasks to reduce low back strain    Status  New    Target Date  03/04/17      PT LONG TERM GOAL #5   Title  Pt will report ability to complete household chores and job task with 50% reduction in low back pain     Status  New    Target Date  03/04/17             Plan - 01/17/17 0956    Clinical Impression Statement  Jeffrey Rhodes is a 76 y/o male who presents to OP PT for chronic bilateral LBP w/o sciatica. Pt reports remote h/o of LBP and was told he had a "bad disc" at the time. 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 Pain has been intermittent since, worsening more recently. Pt demonstrates limited lumbar ROM in all directions along with limited proximal flexibility most pronounced in hip flexors and ITB. Core and proximal LE weakness evident greatest in hip adduction and extension. Pain limits standing and driving tolerance. Jeffrey Rhodes demonstrates good potential to benefit from skilled PT for postural training with emphasis on neutral spine alignment and proper body mechanics, lumbopelvic strengthening and stabilization along with LE strengthening to improve stability and reduce low back pain with work and household chores. Modalities including traaction to be utilized as indicated for pain management.    History and Personal Factors relevant to plan of care:  arthritis, HTN,  CAD s/p MI & CABG in 2014    Clinical Presentation  Stable    Clinical Presentation due to:  chronicity of pain and lack of radicular symptoms    Clinical Decision Making  Low    Rehab Potential  Good    PT Frequency  2x / week    PT Duration  8 weeks    PT Treatment/Interventions  Patient/family education;ADLs/Self Care Home Management;Neuromuscular  re-education;Therapeutic exercise;Therapeutic activities;Functional mobility training;Manual techniques;Dry needling;Taping;Electrical Stimulation;Moist Heat;Cryotherapy;Traction;Iontophoresis 4mg /ml Dexamethasone    Consulted and Agree with Plan of Care  Patient       Patient will benefit from skilled therapeutic intervention in order to improve the following deficits and impairments:  Pain, Impaired flexibility, Decreased range of motion, Decreased strength, Increased muscle spasms, Decreased activity tolerance, Postural dysfunction, Improper body mechanics  Visit Diagnosis: Chronic bilateral low back pain without sciatica - Plan: PT plan of care cert/re-cert  Muscle weakness (generalized) - Plan: PT plan of care cert/re-cert  Other symptoms and signs involving the musculoskeletal system - Plan: PT plan of care cert/re-cert  G-Codes - 53/74/82 1359    Functional Assessment Tool Used (Outpatient Only)  Lumbar spine FOTO = 62% (38% limitation)    Functional Limitation  Mobility: Walking and moving around    Mobility: Walking and Moving Around Current Status (L0786)  At least 20 percent but less than 40 percent impaired, limited or restricted    Mobility: Walking and Moving Around Goal Status (L5449)  At least 20 percent but less than 40 percent impaired, limited or restricted        Problem List Patient Active Problem List   Diagnosis Date Noted  . Chest pain 04/27/2015  . Esophageal reflux 10/05/2012  . Non-ST elevation MI (NSTEMI) ([place]) 09/18/2012  . S/P CABG (coronary artery bypass graft) 09/18/2012  . Dyslipidemia 09/18/2012  .  Sinus arrhythmia 09/18/2012  . Essential hypertension, benign 09/18/2012  . Hypothyroidism 09/18/2012  . Insomnia 09/18/2012    Jeffrey Rhodes, PT, MPT 01/17/2017, 8:18 PM  [facility] [address]  [place], [place], [address] Phone: [phone]   Fax:  [phone]  Name: Keri Tavella MRN: [id number] Date of Birth: 11-18-1940

## 2017-01-17 NOTE — Patient Instructions (Addendum)
Sleep is important to [place] all. Getting good sleep is imperative to adequate functioning during the day. Work with our counselors who are trained to help people obtain quality sleep. Call [phone] to schedule an appointment or if you are curious about insurance coverage/cost.   Try not to eat in the middle of the night.   Try not to take the zolpidem nightly, only when you really need it.

## 2017-01-17 NOTE — Progress Notes (Signed)
Chief Complaint  Patient presents with  . Follow-up    Subjective: Patient is a 76 y.o. male here for insomnia f/u.  Pt was on Trazodone but that gave him side effects. He was then started on low dose doxepin which was not helpful. Remelteon was called in and he did not do well with that either. He is not routinely napping. He is not usually drinking alcohol or caffeine after noon. He was given contact info for counseling and had not set up. He has done well with Ambien in the past.   ROS: Psych: +insomnia  Past Medical History:  Diagnosis Date  . Allergy   . Arthritis   . Coronary artery disease   . High cholesterol   . Hypertension   . Hypothyroid   . Myocardial infarction (HCC)     Objective: BP 140/72 (BP Location: Left Arm, Patient Position: Sitting, Cuff Size: Large)   Pulse 62   Temp 98 F (36.7 C) (Oral)   Ht 5\' 11"  (1.803 m)   Wt 220 lb 8 oz (100 kg)   SpO2 97%   BMI 30.75 kg/m  General: Awake, appears stated age Lungs: No accessory muscle use Psych: Age appropriate judgment and insight, normal affect and mood  Assessment and Plan: Insomnia, unspecified type - Plan: Ambulatory referral to Psychology, TSH, T4, free  Prediabetes - Plan: Hemoglobin A1c  Coronary artery disease involving native heart, angina presence unspecified, unspecified vessel or lesion type - Plan: Lipid panel  Essential hypertension - Plan: Comprehensive metabolic panel  Hypothyroidism, unspecified type  Orders as above. I will order prn Ambien in addition to Remeron. I told him I do not want him to use it nightly as it can cause dependence and have unfavorable side effects. He understands the risks and wishes to proceed. I will now mandate that he set up with the counseling team for CBT. If he does not do this, I will not refill any Ambien. Belsomra is next choice if Remeron fails. F/u in 6 weeks.  The patient voiced understanding and agreement to the plan.  [place],  DO 01/17/17  8:00 AM

## 2017-01-17 NOTE — Progress Notes (Signed)
Pre visit review using our clinic review tool, if applicable. No additional management support is needed unless otherwise documented below in the visit note. 

## 2017-01-21 ENCOUNTER — Other Ambulatory Visit (INDEPENDENT_AMBULATORY_CARE_PROVIDER_SITE_OTHER): Payer: Medicare Other

## 2017-01-21 ENCOUNTER — Other Ambulatory Visit: Payer: Medicare Other

## 2017-01-21 ENCOUNTER — Ambulatory Visit: Payer: Medicare Other

## 2017-01-21 DIAGNOSIS — G4700 Insomnia, unspecified: Secondary | ICD-10-CM

## 2017-01-21 DIAGNOSIS — G8929 Other chronic pain: Secondary | ICD-10-CM | POA: Diagnosis not present

## 2017-01-21 DIAGNOSIS — R7303 Prediabetes: Secondary | ICD-10-CM | POA: Diagnosis not present

## 2017-01-21 DIAGNOSIS — I2510 Atherosclerotic heart disease of native coronary artery without angina pectoris: Secondary | ICD-10-CM | POA: Diagnosis not present

## 2017-01-21 DIAGNOSIS — M545 Low back pain: Principal | ICD-10-CM

## 2017-01-21 DIAGNOSIS — I10 Essential (primary) hypertension: Secondary | ICD-10-CM | POA: Diagnosis not present

## 2017-01-21 DIAGNOSIS — R29898 Other symptoms and signs involving the musculoskeletal system: Secondary | ICD-10-CM | POA: Diagnosis not present

## 2017-01-21 DIAGNOSIS — M6281 Muscle weakness (generalized): Secondary | ICD-10-CM | POA: Diagnosis not present

## 2017-01-21 LAB — COMPREHENSIVE METABOLIC PANEL
ALBUMIN: 4 g/dL (ref 3.5–5.2)
ALT: 13 U/L (ref 0–53)
AST: 14 U/L (ref 0–37)
Alkaline Phosphatase: 54 U/L (ref 39–117)
BUN: 19 mg/dL (ref 6–23)
CALCIUM: 9.4 mg/dL (ref 8.4–10.5)
CHLORIDE: 103 meq/L (ref 96–112)
CO2: 29 mEq/L (ref 19–32)
CREATININE: 1.09 mg/dL (ref 0.40–1.50)
GFR: 70.3 mL/min (ref >60.00)
Glucose, Bld: 116 mg/dL — ABNORMAL HIGH (ref 70–99)
POTASSIUM: 4 meq/L (ref 3.5–5.1)
Sodium: 138 mEq/L (ref 135–145)
Total Bilirubin: 0.5 mg/dL (ref 0.2–1.2)
Total Protein: 7.5 g/dL (ref 6.0–8.3)

## 2017-01-21 LAB — T4, FREE: Free T4: 0.82 ng/dL (ref 0.60–1.60)

## 2017-01-21 LAB — LIPID PANEL
CHOLESTEROL: 146 mg/dL (ref 0–200)
HDL: 42.7 mg/dL (ref >39.00)
LDL CALC: 75 mg/dL (ref 0–99)
NonHDL: 103.75
TRIGLYCERIDES: 142 mg/dL (ref 0.0–149.0)
Total CHOL/HDL Ratio: 3
VLDL: 28.4 mg/dL (ref 0.0–40.0)

## 2017-01-21 LAB — HEMOGLOBIN A1C: Hgb A1c MFr Bld: 6.1 % (ref 4.6–6.5)

## 2017-01-21 LAB — TSH: TSH: 4.6 u[IU]/mL — ABNORMAL HIGH (ref 0.35–4.50)

## 2017-01-21 NOTE — Therapy (Signed)
[facility] [address]  [place], [place], [address] Phone: [phone]   Fax:  [phone]  Physical Therapy Treatment  Patient Details  Name: Jeffrey Rhodes MRN: [id number] Date of Birth: [birth year] Referring Provider: Riki Sheer, DO   Encounter Date: 01/21/2017  PT End of Session - 01/21/17 1024    Visit Number  2    Number of Visits  12    Date for PT Re-Evaluation  03/04/17    Authorization Type  Medicare & AARP    PT Start Time  1016    PT Stop Time  1115    PT Time Calculation (min)  59 min    Activity Tolerance  Patient tolerated treatment well    Behavior During Therapy  [facility] for tasks assessed/performed       Past Medical History:  Diagnosis Date  . Allergy   . Arthritis   . Coronary artery disease   . High cholesterol   . Hypertension   . Hypothyroid   . Myocardial infarction [facility])     Past Surgical History:  Procedure Laterality Date  . CORONARY ARTERY BYPASS GRAFT  2014   [facility]  . DENTAL RESTORATION/EXTRACTION WITH X-RAY      There were no vitals filed for this visit.  Subjective Assessment - 01/21/17 1021    Subjective  Pt. reporting benefit from HEP activities.  Reports neck pain has been bothering him last few days however this is usual for him.  Pt. reporting frequent bouts with neck pain over past 3-4 years.    Limitations  Standing;House hold activities    How long can you stand comfortably?  15-20 minutes    Patient Stated Goals  "to not have no back aches"    Currently in Pain?  Yes    Pain Score  6     Pain Location  Back    Pain Orientation  Lower;Left;Right    Pain Descriptors / Indicators  Sharp    Pain Type  Acute pain    Pain Onset  More than a month ago    Aggravating Factors   prolonged standing     Pain Relieving Factors  prescription pain meds PRN, heating pad    Multiple Pain Sites  Yes    Pain Score  5    Pain Location  Neck    Pain Orientation   Right;Left    Pain Descriptors / Indicators  -- "steady pain" "muscle tension"    Pain Type  Acute pain    Pain Onset  In the past 7 days    Pain Frequency  Intermittent    Aggravating Factors   unsure     Pain Relieving Factors  unsure                       [facility] Adult PT Treatment/Exercise - 01/21/17 1034      Lumbar Exercises: Stretches   Passive Hamstring Stretch  30 seconds;1 rep    Passive Hamstring Stretch Limitations  supine with strap Required cueing to relax LE musculature     Hip Flexor Stretch  30 seconds;1 rep    Hip Flexor Stretch Limitations  mod thomas with strap B; required cueing to relax LE musculature     ITB Stretch  30 seconds;1 rep    ITB Stretch Limitations  supine with strap required cueing for positioning as to feel appropriate stret      Lumbar  Exercises: Aerobic   Stationary Bike  Lvl 2, 6 min       Lumbar Exercises: Supine   Ab Set  10 reps;5 seconds    AB Set Limitations  + pelvic tilt    Bridge  15 reps;3 seconds    Bridge Limitations  + hip adduction pillow squeeze     Other Supine Lumbar Exercises  Hooklying bridge with sustained hip abd/ER isometrics into red TB at knees x 15 reps     Other Supine Lumbar Exercises  Adduction ball squeeze 5" x 10 reps       Lumbar Exercises: Sidelying   Clam  10 reps    Clam Limitations  B; red TB at knees       Moist Heat Therapy   Number Minutes Moist Heat  15 Minutes    Moist Heat Location  Lumbar Spine      Electrical Stimulation   Electrical Stimulation Location  lumbar spine    Electrical Stimulation Action  IFC    Electrical Stimulation Parameters  80-150Hz , intensity to pt. tolerance, 15'     Electrical Stimulation Goals  Pain      Manual Therapy   Manual Therapy  Passive ROM    Manual therapy comments  supine     Passive ROM  Manual B ITB, Figure-4, modified piriformis stretch with therapist x 20 sec each way                PT Short Term Goals - 01/21/17 1024      PT  SHORT TERM GOAL #1   Title  Independent witth initial HEP    Status  On-going        PT Long Term Goals - 01/21/17 1024      PT LONG TERM GOAL #1   Title  Independent with ongoing HEP +/- gym program    Status  On-going      PT LONG TERM GOAL #2   Title  Lumbar ROM WFL w/o increased pain    Status  On-going      PT LONG TERM GOAL #3   Title  B proximal LE strength >/= 4/5 to 4+/5    Status  On-going      PT LONG TERM GOAL #4   Title  Pt will verbalize/demonstrate understanding of good posture and body mechanics with typical daily tasks to reduce low back strain    Status  On-going      PT LONG TERM GOAL #5   Title  Pt will report ability to complete household chores and job task with 50% reduction in low back pain     Status  On-going            Plan - 01/21/17 1025    Clinical Impression Statement  Pt. seen to start treatment with complaint of neck pain onset a few days ago without known trigger.  Reports neck pain has bothers him "on and off" for 3-4 years.  Jeffrey Rhodes reporting some relief from LBP since starting HEP and reports daily adherence.  Required cueing with LE stretches today for relaxation of musculature and proper positioning.  Tolerated all basic supine level lumbopelvic strengthening activities well today.  Pt. ending treatment requesting moist heat/e-stim as this provided him nearly full day relief after last visit.  Will progress per pt. in coming visits.      PT Treatment/Interventions  Patient/family education;ADLs/Self Care Home Management;Neuromuscular re-education;Therapeutic exercise;Therapeutic activities;Functional mobility training;Manual techniques;Dry needling;Taping;Electrical Stimulation;Moist Heat;Cryotherapy;Traction;Iontophoresis 4mg /ml Dexamethasone  Consulted and Agree with Plan of Care  Patient       Patient will benefit from skilled therapeutic intervention in order to improve the following deficits and impairments:  Pain, Impaired  flexibility, Decreased range of motion, Decreased strength, Increased muscle spasms, Decreased activity tolerance, Postural dysfunction, Improper body mechanics  Visit Diagnosis: Chronic bilateral low back pain without sciatica  Muscle weakness (generalized)  Other symptoms and signs involving the musculoskeletal system     Problem List Patient Active Problem List   Diagnosis Date Noted  . Chest pain 04/27/2015  . Esophageal reflux 10/05/2012  . Non-ST elevation MI (NSTEMI) ([place]) 09/18/2012  . S/P CABG (coronary artery bypass graft) 09/18/2012  . Dyslipidemia 09/18/2012  . Sinus arrhythmia 09/18/2012  . Essential hypertension, benign 09/18/2012  . Hypothyroidism 09/18/2012  . Insomnia 09/18/2012    Bess Harvest, PTA 01/21/17 12:41 PM  [facility] [address]  [place], [place], [address] Phone: [phone]   Fax:  [phone]  Name: Jeffrey Rhodes MRN: [id number] Date of Birth: Jun 02, 1940

## 2017-01-24 ENCOUNTER — Ambulatory Visit: Payer: Medicare Other | Attending: Family Medicine

## 2017-01-24 ENCOUNTER — Telehealth: Payer: Self-pay | Admitting: Family Medicine

## 2017-01-24 DIAGNOSIS — G8929 Other chronic pain: Secondary | ICD-10-CM

## 2017-01-24 DIAGNOSIS — M545 Low back pain, unspecified: Secondary | ICD-10-CM

## 2017-01-24 DIAGNOSIS — R29898 Other symptoms and signs involving the musculoskeletal system: Secondary | ICD-10-CM | POA: Insufficient documentation

## 2017-01-24 DIAGNOSIS — M6281 Muscle weakness (generalized): Secondary | ICD-10-CM | POA: Insufficient documentation

## 2017-01-24 NOTE — Telephone Encounter (Signed)
Copied from [place]. Topic: Quick Communication - See Telephone Encounter >> Jan 24, 2017  9:31 AM Boyd Kerbs wrote: CRM for notification. See Telephone encounter for:  Patient got message to call about test results.  No CRM or release for nurse to give out in chart 01/24/17.

## 2017-01-24 NOTE — Patient Instructions (Signed)

## 2017-01-24 NOTE — Therapy (Signed)
[facility] [address]  [place], [place], [address] Phone: [phone]   Fax:  [phone]  Physical Therapy Treatment  Patient Details  Name: Jeffrey Rhodes MRN: [id number] Date of Birth: 17-Jun-1940 Referring Provider: Riki Sheer, DO   Encounter Date: 01/24/2017  PT End of Session - 01/24/17 1457    Visit Number  3    Number of Visits  12    Date for PT Re-Evaluation  03/04/17    Authorization Type  Medicare & AARP    PT Start Time  6967    PT Stop Time  1545    PT Time Calculation (min)  60 min    Activity Tolerance  Patient tolerated treatment well    Behavior During Therapy  [facility] for tasks assessed/performed       Past Medical History:  Diagnosis Date  . Allergy   . Arthritis   . Coronary artery disease   . High cholesterol   . Hypertension   . Hypothyroid   . Myocardial infarction [facility])     Past Surgical History:  Procedure Laterality Date  . CORONARY ARTERY BYPASS GRAFT  2014   [facility]  . DENTAL RESTORATION/EXTRACTION WITH X-RAY      There were no vitals filed for this visit.  Subjective Assessment - 01/24/17 1449    Subjective  Pt. reporting improvement in LBP levels and attributes this to HEP activities and therapy.  Pt. reporting he has some painting to do over the next few days.  Reports increased tolerance for standing activities.      Limitations  Standing;House hold activities    How long can you stand comfortably?  30 min     Patient Stated Goals  "to not have no back aches"    Currently in Pain?  Yes    Pain Score  3     Pain Location  Back    Pain Orientation  Left;Lower    Pain Descriptors / Indicators  Sharp    Pain Type  Acute pain    Pain Onset  More than a month ago    Pain Frequency  Constant    Aggravating Factors   prolonged standing     Multiple Pain Sites  No                      [facility] Adult PT Treatment/Exercise - 01/24/17 1458      Self-Care   Self-Care  Other Self-Care Comments    Other Self-Care Comments   Education and instruction on posture and body mechanics with ADL's with handout       Lumbar Exercises: Stretches   Lower Trunk Rotation  5 reps 5" each way     Lower Trunk Rotation Limitations  cueing required for hold times    Hip Flexor Stretch  30 seconds;1 rep    Hip Flexor Stretch Limitations  mod thomas with strap      Lumbar Exercises: Aerobic   Stationary Bike  NuStep: lvl 6, 7 min       Lumbar Exercises: Machines for Strengthening   Other Lumbar Machine Exercise  BATCA low row 15# x 10 reps      Lumbar Exercises: Standing   Other Standing Lumbar Exercises  Side stepping with red TB at ankles 4 x 15 ft at wall       Lumbar Exercises: Supine   Bridge  15 reps;5 seconds    Bridge Limitations  +  adduction ball squeeze     Other Supine Lumbar Exercises  Adduction ball squeeze 5" x 10 reps       Knee/Hip Exercises: Standing   Wall Squat  10 reps;3 seconds    Wall Squat Limitations  with adduction ball squeeze      Moist Heat Therapy   Number Minutes Moist Heat  15 Minutes    Moist Heat Location  Lumbar Spine      Electrical Stimulation   Electrical Stimulation Location  lumbar spine    Electrical Stimulation Action  IFC    Electrical Stimulation Parameters  80-150Hz , intensity to pt. tolerance, 15'     Electrical Stimulation Goals  Pain             PT Education - 01/24/17 1907    Education provided  Yes    Education Details  posture and body [employer]) Educated  Patient    Methods  Explanation;Demonstration;Verbal cues;Handout    Comprehension  Verbalized understanding;Verbal cues required;Need further instruction       PT Short Term Goals - 01/21/17 1024      PT SHORT TERM GOAL #1   Title  Independent witth initial HEP    Status  On-going        PT Long Term Goals - 01/21/17 1024      PT LONG TERM GOAL #1   Title  Independent with ongoing HEP +/-  gym program    Status  On-going      PT LONG TERM GOAL #2   Title  Lumbar ROM WFL w/o increased pain    Status  On-going      PT LONG TERM GOAL #3   Title  B proximal LE strength >/= 4/5 to 4+/5    Status  On-going      PT LONG TERM GOAL #4   Title  Pt will verbalize/demonstrate understanding of good posture and body mechanics with typical daily tasks to reduce low back strain    Status  On-going      PT LONG TERM GOAL #5   Title  Pt will report ability to complete household chores and job task with 50% reduction in low back pain     Status  On-going            Plan - 01/24/17 1907    Clinical Impression Statement  [name] reporting LBP is improving with therapy and performance of HEP.  Provided education on proper posture and body mechanics with daily activities and household tasks today as pt. reports difficulty with tasks such as vacuuming.  Progressed to side stepping with band resistance and shallow wall sits without issue today.  Pt. progressing well toward goals.      PT Treatment/Interventions  Patient/family education;ADLs/Self Care Home Management;Neuromuscular re-education;Therapeutic exercise;Therapeutic activities;Functional mobility training;Manual techniques;Dry needling;Taping;Electrical Stimulation;Moist Heat;Cryotherapy;Traction;Iontophoresis 4mg /ml Dexamethasone       Patient will benefit from skilled therapeutic intervention in order to improve the following deficits and impairments:  Pain, Impaired flexibility, Decreased range of motion, Decreased strength, Increased muscle spasms, Decreased activity tolerance, Postural dysfunction, Improper body mechanics  Visit Diagnosis: Chronic bilateral low back pain without sciatica  Muscle weakness (generalized)  Other symptoms and signs involving the musculoskeletal system     Problem List Patient Active Problem List   Diagnosis Date Noted  . Chest pain 04/27/2015  . Esophageal reflux 10/05/2012  . Non-ST  elevation MI (NSTEMI) ([facility]) 09/18/2012  . S/P CABG (coronary artery bypass  graft) 09/18/2012  . Dyslipidemia 09/18/2012  . Sinus arrhythmia 09/18/2012  . Essential hypertension, benign 09/18/2012  . Hypothyroidism 09/18/2012  . Insomnia 09/18/2012    Bess Harvest, PTA 01/25/17 5:43 AM  [facility] [address]  [place], [place], [address] Phone: [phone]   Fax:  [phone]  Name: Nnamdi Dacus MRN: [id number] Date of Birth: September 09, 1940

## 2017-01-24 NOTE — Telephone Encounter (Signed)
Called patient regarding Dr. Serita Sheller message regarding labs. Left message on answering machine that all labs look good.

## 2017-01-28 ENCOUNTER — Ambulatory Visit: Payer: Medicare Other

## 2017-01-28 DIAGNOSIS — R29898 Other symptoms and signs involving the musculoskeletal system: Secondary | ICD-10-CM | POA: Diagnosis not present

## 2017-01-28 DIAGNOSIS — M545 Low back pain, unspecified: Secondary | ICD-10-CM

## 2017-01-28 DIAGNOSIS — M6281 Muscle weakness (generalized): Secondary | ICD-10-CM | POA: Diagnosis not present

## 2017-01-28 DIAGNOSIS — G8929 Other chronic pain: Secondary | ICD-10-CM | POA: Diagnosis not present

## 2017-01-28 NOTE — Therapy (Signed)
[facility] [address]  [place], [place], [address] Phone: [phone]   Fax:  [phone]  Physical Therapy Treatment  Patient Details  Name: Jeffrey Rhodes MRN: [id number] Date of Birth: 04/05/1940 Referring Provider: Riki Sheer, DO   Encounter Date: 01/28/2017  PT End of Session - 01/28/17 0800    Visit Number  4    Number of Visits  12    Date for PT Re-Evaluation  03/04/17    Authorization Type  Medicare & AARP    PT Start Time  0754    PT Stop Time  0854    PT Time Calculation (min)  60 min    Activity Tolerance  Patient tolerated treatment well    Behavior During Therapy  [facility] for tasks assessed/performed       Past Medical History:  Diagnosis Date  . Allergy   . Arthritis   . Coronary artery disease   . High cholesterol   . Hypertension   . Hypothyroid   . Myocardial infarction [facility])     Past Surgical History:  Procedure Laterality Date  . CORONARY ARTERY BYPASS GRAFT  2014   [facility]  . DENTAL RESTORATION/EXTRACTION WITH X-RAY      There were no vitals filed for this visit.  Subjective Assessment - 01/28/17 0756    Subjective  Pt. reporting prolonged standing on cement floor Wednesday which he feels increased his LBP Thursday.  No new complaints however does note some lingering muscular soreness following last visit.      Limitations  Standing;House hold activities    Patient Stated Goals  "to not have no back aches"    Currently in Pain?  Yes    Pain Score  3     Pain Location  Back    Pain Orientation  Right;Lower    Pain Descriptors / Indicators  Aching;Sharp    Pain Type  Acute pain    Pain Onset  More than a month ago    Pain Frequency  Constant    Aggravating Factors   prolonged standing     Multiple Pain Sites  No                      [facility] Adult PT Treatment/Exercise - 01/28/17 0803      Lumbar Exercises: Supine   Bent Knee Raise  15 reps;3 seconds     Bent Knee Raise Limitations  red TB at knees + abdom brace    Isometric Hip Flexion  10 reps;3 seconds    Isometric Hip Flexion Limitations  single LE; tactile cueing for abdom. bracing     Other Supine Lumbar Exercises  Hooklying bridge with sustained hip abd/ER isometrics into red TB at knees x 15 reps       Lumbar Exercises: Sidelying   Clam  15 reps    Clam Limitations  B; red TB at knees     Other Sidelying Lumbar Exercises  B sidelying hip adduction x 10 reps; pt. verbalizing fatigue following       Knee/Hip Exercises: Standing   Hip Flexion  Right;Left;10 reps;Knee straight    Hip Flexion Limitations  at counter     Hip Abduction  Right;Left;10 reps;Knee straight    Abduction Limitations  at counter     Hip Extension  Right;Left;10 reps;Knee straight    Extension Limitations  at counter     Forward Step Up  Right;Left;10 reps;Step  Height: 6";Hand Hold: 1    Forward Step Up Limitations  Focusing on eccentric control with step back    Step Down  Right;Left;Step Height: 4";5 reps;Hand Hold: 2    Step Down Limitations  terminated after 5 reps B due to report that exercise was "hard on knees"      Moist Heat Therapy   Number Minutes Moist Heat  15 Minutes    Moist Heat Location  Lumbar Spine      Electrical Stimulation   Electrical Stimulation Location  lumbar spine    Electrical Stimulation Action  IFC    Electrical Stimulation Parameters  80-150Hz , intensity to pt. tolerance, 15'    Electrical Stimulation Goals  Pain             PT Education - 01/28/17 1227    Education provided  Yes    Education Details  3-way standing kickout, sidelying clam shell with red TB issued to pt.     Person(s) Educated  Patient    Methods  Explanation;Demonstration;Verbal cues;Handout    Comprehension  Verbalized understanding;Returned demonstration;Verbal cues required;Need further instruction       PT Short Term Goals - 01/28/17 0801      PT SHORT TERM GOAL #1   Title   Independent witth initial HEP    Status  Achieved        PT Long Term Goals - 01/21/17 1024      PT LONG TERM GOAL #1   Title  Independent with ongoing HEP +/- gym program    Status  On-going      PT LONG TERM GOAL #2   Title  Lumbar ROM WFL w/o increased pain    Status  On-going      PT LONG TERM GOAL #3   Title  B proximal LE strength >/= 4/5 to 4+/5    Status  On-going      PT LONG TERM GOAL #4   Title  Pt will verbalize/demonstrate understanding of good posture and body mechanics with typical daily tasks to reduce low back strain    Status  On-going      PT LONG TERM GOAL #5   Title  Pt will report ability to complete household chores and job task with 50% reduction in low back pain     Status  On-going            Plan - 01/28/17 0801    Clinical Impression Statement  Jeffrey Rhodes tolerating advancement in lumbopelvic strengthening and additional standing activities in treatment well today.  HEP updated with additional strengthening activities.  Pt. reporting continued benefit from HEP activities and reports performance 2x/day.  Had some increased pain Thursday following standing a lot on cement floor during work on Wednesday.  Most difficulty today with 4" eccentric heel-touch reporting this activity hard on knees thus terminated.  [name] progressing well toward goals.  Will continue to benefit from further skilled therapy to improve functional strength and ROM.      PT Treatment/Interventions  Patient/family education;ADLs/Self Care Home Management;Neuromuscular re-education;Therapeutic exercise;Therapeutic activities;Functional mobility training;Manual techniques;Dry needling;Taping;Electrical Stimulation;Moist Heat;Cryotherapy;Traction;Iontophoresis 4mg /ml Dexamethasone    Consulted and Agree with Plan of Care  Patient       Patient will benefit from skilled therapeutic intervention in order to improve the following deficits and impairments:  Pain, Impaired flexibility,  Decreased range of motion, Decreased strength, Increased muscle spasms, Decreased activity tolerance, Postural dysfunction, Improper body mechanics  Visit Diagnosis: Chronic bilateral low back pain without  sciatica  Muscle weakness (generalized)  Other symptoms and signs involving the musculoskeletal system     Problem List Patient Active Problem List   Diagnosis Date Noted  . Chest pain 04/27/2015  . Esophageal reflux 10/05/2012  . Non-ST elevation MI (NSTEMI) ([place]) 09/18/2012  . S/P CABG (coronary artery bypass graft) 09/18/2012  . Dyslipidemia 09/18/2012  . Sinus arrhythmia 09/18/2012  . Essential hypertension, benign 09/18/2012  . Hypothyroidism 09/18/2012  . Insomnia 09/18/2012    Bess Harvest, PTA 01/28/17 12:34 PM  [facility] [address]  [place], [place], [address] Phone: [phone]   Fax:  [phone]  Name: Jeffrey Rhodes MRN: [id number] Date of Birth: 26-Jun-1940

## 2017-01-31 ENCOUNTER — Ambulatory Visit: Payer: Medicare Other | Admitting: Physical Therapy

## 2017-02-04 ENCOUNTER — Ambulatory Visit: Payer: Medicare Other

## 2017-02-04 DIAGNOSIS — M545 Low back pain: Secondary | ICD-10-CM | POA: Diagnosis not present

## 2017-02-04 DIAGNOSIS — G8929 Other chronic pain: Secondary | ICD-10-CM | POA: Diagnosis not present

## 2017-02-04 DIAGNOSIS — R29898 Other symptoms and signs involving the musculoskeletal system: Secondary | ICD-10-CM | POA: Diagnosis not present

## 2017-02-04 DIAGNOSIS — M6281 Muscle weakness (generalized): Secondary | ICD-10-CM

## 2017-02-04 NOTE — Therapy (Signed)
[facility] [address]  [place], [place], [address] Phone: [phone]   Fax:  [phone]  Physical Therapy Treatment  Patient Details  Name: Jeffrey Rhodes MRN: [id number] Date of Birth: 12/15/1940 Referring Provider: Riki Sheer, DO   Encounter Date: 02/04/2017  PT End of Session - 02/04/17 0804    Visit Number  5    Number of Visits  12    Date for PT Re-Evaluation  03/04/17    Authorization Type  Medicare & AARP    PT Start Time  0800    PT Stop Time  0858    PT Time Calculation (min)  58 min    Activity Tolerance  Patient tolerated treatment well    Behavior During Therapy  [facility] for tasks assessed/performed       Past Medical History:  Diagnosis Date  . Allergy   . Arthritis   . Coronary artery disease   . High cholesterol   . Hypertension   . Hypothyroid   . Myocardial infarction [facility])     Past Surgical History:  Procedure Laterality Date  . CORONARY ARTERY BYPASS GRAFT  2014   [facility]  . DENTAL RESTORATION/EXTRACTION WITH X-RAY      There were no vitals filed for this visit.  Subjective Assessment - 02/04/17 0801    Subjective  Pt. reporting worsened LBP upon waking this morning without known trigger.  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      Patient Stated Goals  "to not have no back aches"    Currently in Pain?  Yes    Pain Score  3     Pain Location  Back    Pain Orientation  Left;Lower    Pain Descriptors / Indicators  Aching;Sharp    Pain Frequency  Constant    Aggravating Factors   prolonged standing    Pain Relieving Factors  sitting,     Multiple Pain Sites  No                      [facility] Adult PT Treatment/Exercise - 02/04/17 0814      Therapeutic Activites    Therapeutic Activities  Lifting    Lifting  Reviewed proper lifting technique with light item from floor; pt. requiring some cueing for upright  posture       Lumbar Exercises: Stretches   Lower Trunk Rotation  2 reps;30 seconds    Lower Trunk Rotation Limitations  LE on orange p-ball     Standing Extension  5 reps;10 seconds    Standing Extension Limitations  leaning into counter       Lumbar Exercises: Aerobic   Stationary Bike  NuStep: lvl 6, 5 min       Lumbar Exercises: Supine   Other Supine Lumbar Exercises  Hooklying self LTR 5 x 10 sec each way      Lumbar Exercises: Prone   Other Prone Lumbar Exercises  POE x 30 sec; pt. reporting lower back tightness however no increase in pain       Knee/Hip Exercises: Standing   Hip Flexion  Right;Left;10 reps;Knee straight    Hip Flexion Limitations  yellow looped TB at ankles; at counter     Hip Abduction  Right;Left;10 reps;Knee straight    Abduction Limitations  yellow looped TB at ankles; counter     Hip Extension  Right;Left;10 reps;Knee  straight    Extension Limitations  yelllow looped TB at counter     Functional Squat  15 reps;3 seconds;2 sets    Functional Squat Limitations  minisquat at counter  restricted to mini squat due to B knee pain with mod depth       Moist Heat Therapy   Number Minutes Moist Heat  15 Minutes    Moist Heat Location  Lumbar Spine      Electrical Stimulation   Electrical Stimulation Location  lumbar spine    Electrical Stimulation Action  IFC    Electrical Stimulation Parameters  80-150Hz , intensity to pt. tolerance, 15'     Electrical Stimulation Goals  Pain      Manual Therapy   Manual Therapy  Passive ROM    Manual therapy comments  supine     Passive ROM  Manual SKTC, glute, HS stretch with therapist x 20 sec each way             PT Education - 02/04/17 0850    Education provided  Yes    Education Details  counter mini squat,LTR    Person(s) Educated  Patient    Methods  Explanation;Demonstration;Verbal cues;Handout    Comprehension  Verbalized understanding;Returned demonstration;Verbal cues required;Need further  instruction       PT Short Term Goals - 01/28/17 0801      PT SHORT TERM GOAL #1   Title  Independent witth initial HEP    Status  Achieved        PT Long Term Goals - 01/21/17 1024      PT LONG TERM GOAL #1   Title  Independent with ongoing HEP +/- gym program    Status  On-going      PT LONG TERM GOAL #2   Title  Lumbar ROM WFL w/o increased pain    Status  On-going      PT LONG TERM GOAL #3   Title  B proximal LE strength >/= 4/5 to 4+/5    Status  On-going      PT LONG TERM GOAL #4   Title  Pt will verbalize/demonstrate understanding of good posture and body mechanics with typical daily tasks to reduce low back strain    Status  On-going      PT LONG TERM GOAL #5   Title  Pt will report ability to complete household chores and job task with 50% reduction in low back pain     Status  On-going            Plan - 02/04/17 0805    Clinical Impression Statement  Pt. reporting increased LBP this morning upon waking without known trigger.  Continues to report daily HEP adherence 2x/day without issue.  Tolerated progression of standing hip strengthening and addition of mini squat without pain.  Did have some knee pain with moderate depth squat at counter thus mini squat performed to avoid pain.  Jeffrey Rhodes feels he is still improving with therapy and reports getting relief from back pain for remainder of day following therapy sessions.  Continues to demo lumbar ROM restriction most limited in B rotation and extension.  Initiated prone and standing extension ROM and LTR with HEP updated for hopeful continued improvement in lumbar ROM.  Ended treatment with moist heat/E-stim as pt. still noting good benefit from this.  Will monitor tolerance to updated HEP in coming visit.       PT Treatment/Interventions  Patient/family education;ADLs/Self Care Home Management;Neuromuscular re-education;Therapeutic  exercise;Therapeutic activities;Functional mobility training;Manual techniques;Dry  needling;Taping;Electrical Stimulation;Moist Heat;Cryotherapy;Traction;Iontophoresis 4mg /ml Dexamethasone    Consulted and Agree with Plan of Care  Patient       Patient will benefit from skilled therapeutic intervention in order to improve the following deficits and impairments:  Pain, Impaired flexibility, Decreased range of motion, Decreased strength, Increased muscle spasms, Decreased activity tolerance, Postural dysfunction, Improper body mechanics  Visit Diagnosis: Chronic bilateral low back pain without sciatica  Muscle weakness (generalized)  Other symptoms and signs involving the musculoskeletal system     Problem List Patient Active Problem List   Diagnosis Date Noted  . Chest pain 04/27/2015  . Esophageal reflux 10/05/2012  . Non-ST elevation MI (NSTEMI) ([place]) 09/18/2012  . S/P CABG (coronary artery bypass graft) 09/18/2012  . Dyslipidemia 09/18/2012  . Sinus arrhythmia 09/18/2012  . Essential hypertension, benign 09/18/2012  . Hypothyroidism 09/18/2012  . Insomnia 09/18/2012    Bess Harvest, PTA 02/04/17 9:14 AM  [facility] [address]  [place], [place], [address] Phone: [phone]   Fax:  [phone]  Name: Jeffrey Rhodes MRN: [id number] Date of Birth: 01-Mar-1940

## 2017-02-07 ENCOUNTER — Ambulatory Visit: Payer: Medicare Other

## 2017-02-07 DIAGNOSIS — G8929 Other chronic pain: Secondary | ICD-10-CM | POA: Diagnosis not present

## 2017-02-07 DIAGNOSIS — M545 Low back pain: Secondary | ICD-10-CM | POA: Diagnosis not present

## 2017-02-07 DIAGNOSIS — M6281 Muscle weakness (generalized): Secondary | ICD-10-CM | POA: Diagnosis not present

## 2017-02-07 DIAGNOSIS — R29898 Other symptoms and signs involving the musculoskeletal system: Secondary | ICD-10-CM

## 2017-02-07 NOTE — Therapy (Signed)
[facility] [address]  [place], [place], [address] Phone: [phone]   Fax:  [phone]  Physical Therapy Treatment  Patient Details  Name: Jeffrey Rhodes MRN: [id number] Date of Birth: August 09, 1940 Referring Provider: Riki Sheer, DO   Encounter Date: 02/07/2017  PT End of Session - 02/07/17 0758    Visit Number  6    Number of Visits  12    Date for PT Re-Evaluation  03/04/17    Authorization Type  Medicare & AARP    PT Start Time  0755    PT Stop Time  0852    PT Time Calculation (min)  57 min    Activity Tolerance  Patient tolerated treatment well    Behavior During Therapy  [facility] for tasks assessed/performed       Past Medical History:  Diagnosis Date  . Allergy   . Arthritis   . Coronary artery disease   . High cholesterol   . Hypertension   . Hypothyroid   . Myocardial infarction [facility])     Past Surgical History:  Procedure Laterality Date  . CORONARY ARTERY BYPASS GRAFT  2014   [facility]  . DENTAL RESTORATION/EXTRACTION WITH X-RAY      There were no vitals filed for this visit.  Subjective Assessment - 02/07/17 0756    Subjective  Pt. reporting back pain has improved some since last visit.      Limitations  Standing;House hold activities    Patient Stated Goals  "to not have no back aches"    Currently in Pain?  Yes    Pain Score  3     Pain Orientation  Left;Lower    Pain Descriptors / Indicators  Aching;Sharp    Pain Type  Acute pain    Pain Onset  More than a month ago    Pain Frequency  Constant    Multiple Pain Sites  No                      [facility] Adult PT Treatment/Exercise - 02/07/17 0806      Lumbar Exercises: Aerobic   Stationary Bike  Lvl 2, 6 min       Lumbar Exercises: Machines for Strengthening   Other Lumbar Machine Exercise  BATCA low row 15# x 15 reps      Lumbar Exercises: Seated   Hip Flexion on Ball  10 reps;Right;Left    Hip Flexion  on Ball Limitations  seated on green P-ball  cueing for abdom. bracing     Other Seated Lumbar Exercises  B pallof press with red TB seated on greenp-ball x 10 reps       Lumbar Exercises: Supine   Clam  10 reps;3 seconds    Clam Limitations  Hooklying abdom brace with alternating hip abd/ER with green band at knees     Isometric Hip Flexion  10 reps;3 seconds 2 sets     Isometric Hip Flexion Limitations  2 LE; tactile cueing for abdom. brace  cueing required for breathing       Knee/Hip Exercises: Standing   Hip Flexion  Right;Left;10 reps;Knee straight    Hip Flexion Limitations  2# at ankles; counter     Hip Abduction  Right;Left;10 reps;Knee straight    Abduction Limitations  2# at ankles; counter     Hip Extension  Right;Left;10 reps;Knee straight    Extension Limitations  2# at ankles; counter  Forward Step Up  Right;Left;Step Height: 6";15 reps;Hand Hold: 0    Forward Step Up Limitations  no UE support       Moist Heat Therapy   Number Minutes Moist Heat  15 Minutes    Moist Heat Location  Lumbar Spine      Electrical Stimulation   Electrical Stimulation Location  lumbar spine    Electrical Stimulation Action  IFC    Electrical Stimulation Parameters  80-150Hz , intensity to pt. tolerance; 15'     Electrical Stimulation Goals  Pain             PT Education - 02/07/17 0839    Education provided  Yes    Education Details  Pt. instructed to not perform Counter mini squat at home as this is causing knee pain     Person(s) Educated  Patient    Methods  Explanation;Verbal cues;Handout    Comprehension  Verbalized understanding;Verbal cues required;Need further instruction       PT Short Term Goals - 01/28/17 0801      PT SHORT TERM GOAL #1   Title  Independent witth initial HEP    Status  Achieved        PT Long Term Goals - 01/21/17 1024      PT LONG TERM GOAL #1   Title  Independent with ongoing HEP +/- gym program    Status  On-going      PT LONG  TERM GOAL #2   Title  Lumbar ROM WFL w/o increased pain    Status  On-going      PT LONG TERM GOAL #3   Title  B proximal LE strength >/= 4/5 to 4+/5    Status  On-going      PT LONG TERM GOAL #4   Title  Pt will verbalize/demonstrate understanding of good posture and body mechanics with typical daily tasks to reduce low back strain    Status  On-going      PT LONG TERM GOAL #5   Title  Pt will report ability to complete household chores and job task with 50% reduction in low back pain     Status  On-going            Plan - 02/07/17 0758    Clinical Impression Statement  Pt. reporting some improvement in LBP levels since last visit however has not had work shift since last week.  Still reports good relief in LBP following therapy sessions and performance of HEP.  Progressed hip and lumbopelvic strengthening activities today which was tolerated well.  Ended with E-stim/moist heat at pt. reports relief from LBP for remainder of day following this.  Pt. 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  Pt. will continue to benefit from further skilled therapy to improve lumbopelilvic control, posture and body mechanics, for hopeful decrease in LBP with work related tasks.      PT Treatment/Interventions  Patient/family education;ADLs/Self Care Home Management;Neuromuscular re-education;Therapeutic exercise;Therapeutic activities;Functional mobility training;Manual techniques;Dry needling;Taping;Electrical Stimulation;Moist Heat;Cryotherapy;Traction;Iontophoresis 4mg /ml Dexamethasone    Consulted and Agree with Plan of Care  Patient       Patient will benefit from skilled therapeutic intervention in order to improve the following deficits and impairments:  Pain, Impaired flexibility, Decreased range of motion, Decreased strength, Increased muscle spasms, Decreased activity tolerance, Postural dysfunction,  Improper body mechanics  Visit Diagnosis: Chronic bilateral low back pain without sciatica  Muscle weakness (generalized)  Other symptoms and signs involving the musculoskeletal system     Problem List Patient Active Problem List   Diagnosis Date Noted  . Chest pain 04/27/2015  . Esophageal reflux 10/05/2012  . Non-ST elevation MI (NSTEMI) ([place]) 09/18/2012  . S/P CABG (coronary artery bypass graft) 09/18/2012  . Dyslipidemia 09/18/2012  . Sinus arrhythmia 09/18/2012  . Essential hypertension, benign 09/18/2012  . Hypothyroidism 09/18/2012  . Insomnia 09/18/2012    Bess Harvest, PTA 02/07/17 3:12 PM  [facility] [address]  [place], [place], [address] Phone: [phone]   Fax:  [phone]  Name: Jeffrey Rhodes MRN: [id number] Date of Birth: 20-Mar-1940

## 2017-02-10 ENCOUNTER — Other Ambulatory Visit: Payer: Self-pay | Admitting: Family Medicine

## 2017-02-10 MED ORDER — MIRTAZAPINE 30 MG PO TABS
30.0000 mg | ORAL_TABLET | Freq: Every day | ORAL | 0 refills | Status: DC
Start: 2017-02-10 — End: 2017-02-28

## 2017-02-10 MED ORDER — TAMSULOSIN HCL 0.4 MG PO CAPS: 0.4000 mg | ORAL_CAPSULE | Freq: Every day | ORAL | 1 refills | Status: DC

## 2017-02-11 ENCOUNTER — Encounter: Payer: Self-pay | Admitting: Physical Therapy

## 2017-02-11 ENCOUNTER — Ambulatory Visit: Payer: Medicare Other | Admitting: Physical Therapy

## 2017-02-11 DIAGNOSIS — R29898 Other symptoms and signs involving the musculoskeletal system: Secondary | ICD-10-CM

## 2017-02-11 DIAGNOSIS — M6281 Muscle weakness (generalized): Secondary | ICD-10-CM | POA: Diagnosis not present

## 2017-02-11 DIAGNOSIS — G8929 Other chronic pain: Secondary | ICD-10-CM | POA: Diagnosis not present

## 2017-02-11 DIAGNOSIS — M545 Low back pain: Principal | ICD-10-CM

## 2017-02-11 NOTE — Therapy (Signed)
[facility] [address]  [place], [place], [address] Phone: [phone]   Fax:  [phone]  Physical Therapy Treatment  Patient Details  Name: Jeffrey Rhodes MRN: [id number] Date of Birth: February 20, 1941 Referring Provider: Riki Sheer, DO   Encounter Date: 02/11/2017  PT End of Session - 02/11/17 0800    Visit Number  7    Number of Visits  12    Date for PT Re-Evaluation  03/04/17    Authorization Type  Medicare & AARP    PT Start Time  0800    PT Stop Time  0854    PT Time Calculation (min)  54 min    Activity Tolerance  Patient tolerated treatment well    Behavior During Therapy  [facility] for tasks assessed/performed       Past Medical History:  Diagnosis Date  . Allergy   . Arthritis   . Coronary artery disease   . High cholesterol   . Hypertension   . Hypothyroid   . Myocardial infarction [facility])     Past Surgical History:  Procedure Laterality Date  . CORONARY ARTERY BYPASS GRAFT  2014   [facility]  . DENTAL RESTORATION/EXTRACTION WITH X-RAY      There were no vitals filed for this visit.  Subjective Assessment - 02/11/17 0802    Subjective  Pt reporting he feels like his back pain "has gone donw a notch".    Patient Stated Goals  "to not have no back aches"    Currently in Pain?  Yes    Pain Score  -- 2-3/10    Pain Location  Back    Pain Orientation  Lower;Left    Pain Descriptors / Indicators  Aching;Constant    Pain Type  Acute pain    Pain Onset  More than a month ago    Pain Frequency  Constant                      [facility] Adult PT Treatment/Exercise - 02/11/17 0800      Self-Care   Other Self-Care Comments   Instruction in set-up & use of home TENS/MT unit      Exercises   Exercises  Lumbar      Lumbar Exercises: Aerobic   Stationary Bike  L2 x 6'      Lumbar Exercises: Standing   Heel Raises  15 reps;3 seconds    Row  Both;15 reps;Theraband;Strengthening     Theraband Level (Row)  Level 2 (Red)    Row Limitations  staggered stance    Shoulder Extension  Both;15 reps;Theraband;Strengthening    Theraband Level (Shoulder Extension)  Level 2 (Red)    Shoulder Extension Limitations  staggered stance      Lumbar Exercises: Supine   Dead Bug  10 reps;3 seconds    Bridge  10 reps;5 seconds 2 sets    Bridge Limitations  1st set + red TB hip abduction isometric, 2nd set + alt hip ABD/ER with red TB      Lumbar Exercises: Prone   Single Arm Raise  Right;10 reps;3 seconds    Straight Leg Raise  10 reps;3 seconds      Modalities   Modalities  Electrical Stimulation;Moist Heat      Moist Heat Therapy   Number Minutes Moist Heat  15 Minutes    Moist Heat Location  Lumbar Spine      Electrical Stimulation   Electrical  Stimulation Location  lumbar spine    Electrical Stimulation Action  TENS    Electrical Stimulation Parameters  MRW, intensity to pt tol x15'    Electrical Stimulation Goals  Pain             PT Education - 02/11/17 0844    Education Details  Pt issued home TENS/MT with instructions provided in set-up and use    Person(s) Educated  Patient    Methods  Explanation;Demonstration    Comprehension  Verbalized understanding;Returned demonstration       PT Short Term Goals - 01/28/17 0801      PT SHORT TERM GOAL #1   Title  Independent witth initial HEP    Status  Achieved        PT Long Term Goals - 01/21/17 1024      PT LONG TERM GOAL #1   Title  Independent with ongoing HEP +/- gym program    Status  On-going      PT LONG TERM GOAL #2   Title  Lumbar ROM WFL w/o increased pain    Status  On-going      PT LONG TERM GOAL #3   Title  B proximal LE strength >/= 4/5 to 4+/5    Status  On-going      PT LONG TERM GOAL #4   Title  Pt will verbalize/demonstrate understanding of good posture and body mechanics with typical daily tasks to reduce low back strain    Status  On-going      PT LONG TERM GOAL #5    Title  Pt will report ability to complete household chores and job task with 50% reduction in low back pain     Status  On-going            Plan - 02/11/17 0803    Clinical Impression Statement  Pt reporting good tolerance for and compliance with HEP, other than deferring minisquats due to knee pain. Good tolerance for new exercises attempted today, but cues necessary for abdominal bracing/core activation w/o holding breath and proper pacing of movement. Pt noting lasting benefit from prior use of estim during treatments and insurance coverage approved for home TENS/MT unit, therefore issued unit and provided instruction in set-up and home use.    Rehab Potential  Good    PT Treatment/Interventions  Patient/family education;ADLs/Self Care Home Management;Neuromuscular re-education;Therapeutic exercise;Therapeutic activities;Functional mobility training;Manual techniques;Dry needling;Taping;Electrical Stimulation;Moist Heat;Cryotherapy;Traction;Iontophoresis 4mg /ml Dexamethasone    Consulted and Agree with Plan of Care  Patient       Patient will benefit from skilled therapeutic intervention in order to improve the following deficits and impairments:  Pain, Impaired flexibility, Decreased range of motion, Decreased strength, Increased muscle spasms, Decreased activity tolerance, Postural dysfunction, Improper body mechanics  Visit Diagnosis: Chronic bilateral low back pain without sciatica  Muscle weakness (generalized)  Other symptoms and signs involving the musculoskeletal system     Problem List Patient Active Problem List   Diagnosis Date Noted  . Chest pain 04/27/2015  . Esophageal reflux 10/05/2012  . Non-ST elevation MI (NSTEMI) ([place]) 09/18/2012  . S/P CABG (coronary artery bypass graft) 09/18/2012  . Dyslipidemia 09/18/2012  . Sinus arrhythmia 09/18/2012  . Essential hypertension, benign 09/18/2012  . Hypothyroidism 09/18/2012  . Insomnia 09/18/2012    Percival Spanish, PT, MPT 02/11/2017, 10:02 AM  [facility] [address]  [place], [place], [address] Phone: [phone]   Fax:  [phone]  Name: Jeffrey Rhodes MRN: [id number] Date of Birth: 02-Jun-1940

## 2017-02-14 ENCOUNTER — Ambulatory Visit: Payer: Medicare Other

## 2017-02-14 DIAGNOSIS — G8929 Other chronic pain: Secondary | ICD-10-CM | POA: Diagnosis not present

## 2017-02-14 DIAGNOSIS — M6281 Muscle weakness (generalized): Secondary | ICD-10-CM | POA: Diagnosis not present

## 2017-02-14 DIAGNOSIS — M545 Low back pain, unspecified: Secondary | ICD-10-CM

## 2017-02-14 DIAGNOSIS — R29898 Other symptoms and signs involving the musculoskeletal system: Secondary | ICD-10-CM

## 2017-02-14 NOTE — Therapy (Signed)
[facility] [address]  [place], [place], [address] Phone: [phone]   Fax:  [phone]  Physical Therapy Treatment  Patient Details  Name: Jeffrey Rhodes MRN: [id number] Date of Birth: Jul 06, 1940 Referring Provider: Riki Sheer, DO   Encounter Date: 02/14/2017  PT End of Session - 02/14/17 0759    Visit Number  8    Number of Visits  12    Date for PT Re-Evaluation  03/04/17    Authorization Type  Medicare & AARP    PT Start Time  0753    PT Stop Time  0854    PT Time Calculation (min)  61 min    Activity Tolerance  Patient tolerated treatment well    Behavior During Therapy  [facility] for tasks assessed/performed       Past Medical History:  Diagnosis Date  . Allergy   . Arthritis   . Coronary artery disease   . High cholesterol   . Hypertension   . Hypothyroid   . Myocardial infarction [facility])     Past Surgical History:  Procedure Laterality Date  . CORONARY ARTERY BYPASS GRAFT  2014   [facility]  . DENTAL RESTORATION/EXTRACTION WITH X-RAY      There were no vitals filed for this visit.  Subjective Assessment - 02/14/17 0756    Subjective  Pt. reporting has has had a good weekend with much less back pain.      Limitations  Standing;House hold activities    Patient Stated Goals  "to not have no back aches"    Currently in Pain?  Yes    Pain Score  1     Pain Location  Back    Pain Orientation  Lower;Left    Pain Descriptors / Indicators  Aching;Constant    Pain Type  Acute pain    Pain Onset  More than a month ago    Pain Frequency  Constant    Aggravating Factors   prolonged standing     Pain Relieving Factors  sitting     Multiple Pain Sites  No                      [facility] Adult PT Treatment/Exercise - 02/14/17 0808      Self-Care   Self-Care  Other Self-Care Comments    Other Self-Care Comments   Review of use of home TENS/MT unit       Lumbar Exercises: Aerobic    Stationary Bike  L2 x 6'      Lumbar Exercises: Machines for Strengthening   Other Lumbar Machine Exercise  BATCA straight arm pulldown 10# x 15 reps     Other Lumbar Machine Exercise  BATCA B single arm low row 10# x 15 reps       Lumbar Exercises: Standing   Other Standing Lumbar Exercises  B pallof press narrow stance with green TB in door x 15 reps       Lumbar Exercises: Seated   Hip Flexion on Ball  10 reps;Right;Left    Hip Flexion on Ball Limitations  seated on green P-ball       Lumbar Exercises: Supine   Dead Bug  10 reps;3 seconds    Other Supine Lumbar Exercises  Hooklying bridge with Alternating hip abd/ER x 2 at top of movement with green TB at knees x 15 reps       Lumbar Exercises: Sidelying   Clam  10 reps;3 seconds    Clam Limitations  B; green TB at knees       Lumbar Exercises: Prone   Straight Leg Raise  10 reps;3 seconds      Lumbar Exercises: Quadruped   Straight Leg Raise  5 reps    Straight Leg Raises Limitations  peanut p-ball; some knee irritation thus terminated       Modalities   Modalities  Electrical Stimulation;Moist Heat      Moist Heat Therapy   Number Minutes Moist Heat  15 Minutes    Moist Heat Location  Lumbar Spine      Electrical Stimulation   Electrical Stimulation Location  lumbar spine    Electrical Stimulation Action  TENS     Electrical Stimulation Parameters  MRW, intensity to pt. tolerance, 15'     Electrical Stimulation Goals  Pain             PT Education - 02/14/17 0848    Education provided  Yes    Education Details  looped green issued to pt. for clam shell     Person(s) Educated  Patient    Methods  Explanation;Demonstration;Verbal cues;Handout    Comprehension  Verbalized understanding;Returned demonstration;Verbal cues required;Need further instruction       PT Short Term Goals - 01/28/17 0801      PT SHORT TERM GOAL #1   Title  Independent witth initial HEP    Status  Achieved        PT Long  Term Goals - 01/21/17 1024      PT LONG TERM GOAL #1   Title  Independent with ongoing HEP +/- gym program    Status  On-going      PT LONG TERM GOAL #2   Title  Lumbar ROM WFL w/o increased pain    Status  On-going      PT LONG TERM GOAL #3   Title  B proximal LE strength >/= 4/5 to 4+/5    Status  On-going      PT LONG TERM GOAL #4   Title  Pt will verbalize/demonstrate understanding of good posture and body mechanics with typical daily tasks to reduce low back strain    Status  On-going      PT LONG TERM GOAL #5   Title  Pt will report ability to complete household chores and job task with 50% reduction in low back pain     Status  On-going            Plan - 02/14/17 0800    Clinical Impression Statement  Dev reporting good relief from LBP over weekend noting, "This is the best it's felt in six months". Tolerated progression of lumbopelvic stability activities on p-ball and addition of machine scapular strengthening activities well today.  Did clarify questions regarding home TENS/MT unit today with demo with pt. to test for understanding and pt. able to demo understanding.  Will continue to progress toward goals.      PT Treatment/Interventions  Patient/family education;ADLs/Self Care Home Management;Neuromuscular re-education;Therapeutic exercise;Therapeutic activities;Functional mobility training;Manual techniques;Dry needling;Taping;Electrical Stimulation;Moist Heat;Cryotherapy;Traction;Iontophoresis 4mg /ml Dexamethasone    Consulted and Agree with Plan of Care  Patient       Patient will benefit from skilled therapeutic intervention in order to improve the following deficits and impairments:  Pain, Impaired flexibility, Decreased range of motion, Decreased strength, Increased muscle spasms, Decreased activity tolerance, Postural dysfunction, Improper body mechanics  Visit Diagnosis: Chronic bilateral low back pain without  sciatica  Muscle weakness  (generalized)  Other symptoms and signs involving the musculoskeletal system     Problem List Patient Active Problem List   Diagnosis Date Noted  . Chest pain 04/27/2015  . Esophageal reflux 10/05/2012  . Non-ST elevation MI (NSTEMI) ([place]) 09/18/2012  . S/P CABG (coronary artery bypass graft) 09/18/2012  . Dyslipidemia 09/18/2012  . Sinus arrhythmia 09/18/2012  . Essential hypertension, benign 09/18/2012  . Hypothyroidism 09/18/2012  . Insomnia 09/18/2012    Bess Harvest, PTA 02/14/17 8:49 AM  [facility] [address]  [place], [place], [address] Phone: [phone]   Fax:  [phone]  Name: Trig Mcbryar MRN: [id number] Date of Birth: 19-Nov-1940

## 2017-02-18 ENCOUNTER — Ambulatory Visit: Payer: Medicare Other

## 2017-02-18 DIAGNOSIS — M545 Low back pain, unspecified: Secondary | ICD-10-CM

## 2017-02-18 DIAGNOSIS — M6281 Muscle weakness (generalized): Secondary | ICD-10-CM

## 2017-02-18 DIAGNOSIS — G8929 Other chronic pain: Secondary | ICD-10-CM

## 2017-02-18 DIAGNOSIS — R29898 Other symptoms and signs involving the musculoskeletal system: Secondary | ICD-10-CM

## 2017-02-18 NOTE — Therapy (Signed)
[facility] [address]  [place], [place], [address] Phone: [phone]   Fax:  [phone]  Physical Therapy Treatment  Patient Details  Name: Jeffrey Rhodes MRN: [id number] Date of Birth: 1940/06/01 Referring Provider: Riki Sheer, DO   Encounter Date: 02/18/2017  PT End of Session - 02/18/17 0803    Visit Number  9    Number of Visits  12    Date for PT Re-Evaluation  03/04/17    Authorization Type  Medicare & AARP    PT Start Time  9390    PT Stop Time  0855 Ended with 10 min moist heat     PT Time Calculation (min)  60 min    Activity Tolerance  Patient tolerated treatment well    Behavior During Therapy  [facility] for tasks assessed/performed       Past Medical History:  Diagnosis Date  . Allergy   . Arthritis   . Coronary artery disease   . High cholesterol   . Hypertension   . Hypothyroid   . Myocardial infarction [facility])     Past Surgical History:  Procedure Laterality Date  . CORONARY ARTERY BYPASS GRAFT  2014   [facility]  . DENTAL RESTORATION/EXTRACTION WITH X-RAY      There were no vitals filed for this visit.  Subjective Assessment - 02/18/17 0758    Subjective  Reports 80% improvement in pain since starting therapy and no longer feels limited with prolonged standing due to LBP.      Patient Stated Goals  "to not have no back aches"    Currently in Pain?  Yes    Pain Score  -- .5/10    Pain Location  Back    Pain Orientation  Lower;Left    Pain Descriptors / Indicators  Aching;Constant    Pain Type  Acute pain    Pain Onset  More than a month ago    Pain Frequency  Constant    Aggravating Factors   unsure     Multiple Pain Sites  No                      [facility] Adult PT Treatment/Exercise - 02/18/17 0808      Lumbar Exercises: Stretches   Passive Hamstring Stretch  30 seconds;1 rep    Passive Hamstring Stretch Limitations  strap; improved ROM     Lower Trunk  Rotation  2 reps;30 seconds pt. reporting good benefit from this     Lower Trunk Rotation Limitations  LE on orange p-ball       Lumbar Exercises: Aerobic   Stationary Bike  L3 x 6'      Lumbar Exercises: Seated   Hip Flexion on Ball  10 reps;Right;Left    Hip Flexion on Ball Limitations  seated on green P-ball       Lumbar Exercises: Supine   Dead Bug  3 seconds;15 reps    Isometric Hip Flexion  3 seconds;15 reps      Lumbar Exercises: Prone   Single Arm Raise  Right;10 reps;3 seconds    Straight Leg Raise  10 reps;3 seconds      Knee/Hip Exercises: Standing   Hip Flexion  Right;Left;10 reps;Knee straight    Hip Flexion Limitations  red looped TB at ankles; counter     Hip Abduction  Right;Left;10 reps;Knee straight    Abduction Limitations  red looped TB at ankles; counter  Hip Extension  Right;Left;10 reps;Knee straight    Extension Limitations  red looped TB at ankles; counter    Wall Squat  3 seconds;15 reps    Wall Squat Limitations  adduction ball squeeze; depth to pt. tolerance       Modalities   Modalities  Moist Heat      Moist Heat Therapy   Number Minutes Moist Heat  10 Minutes    Moist Heat Location  Lumbar Spine               PT Short Term Goals - 01/28/17 0801      PT SHORT TERM GOAL #1   Title  Independent witth initial HEP    Status  Achieved        PT Long Term Goals - 02/18/17 0803      PT LONG TERM GOAL #1   Title  Independent with ongoing HEP +/- gym program    Status  On-going      PT LONG TERM GOAL #2   Title  Lumbar ROM WFL w/o increased pain    Status  On-going      PT LONG TERM GOAL #3   Title  B proximal LE strength >/= 4/5 to 4+/5    Status  On-going      PT LONG TERM GOAL #4   Title  Pt will verbalize/demonstrate understanding of good posture and body mechanics with typical daily tasks to reduce low back strain    Status  On-going      PT LONG TERM GOAL #5   Title  Pt will report ability to complete household  chores and job task with 50% reduction in low back pain     Status  Achieved 80%             Plan - 02/18/17 0809    Clinical Impression Statement  Jeffrey Rhodes making good progress with therapy thus far.  Reports 80% improvement in LBP since starting therapy.  Does not feel limited by LBP with prolonged standing now.  Tolerated progression of standing hip and supine level lumbopelvic strengthening activities today with only slight rise in LBP, which quickly subsided.  Ended treatment with moist heat application to lumbar and mid back as pt. still noting good benefit with this.  Pt. progressing well toward goals.      PT Treatment/Interventions  Patient/family education;ADLs/Self Care Home Management;Neuromuscular re-education;Therapeutic exercise;Therapeutic activities;Functional mobility training;Manual techniques;Dry needling;Taping;Electrical Stimulation;Moist Heat;Cryotherapy;Traction;Iontophoresis 4mg /ml Dexamethasone    Consulted and Agree with Plan of Care  Patient       Patient will benefit from skilled therapeutic intervention in order to improve the following deficits and impairments:  Pain, Impaired flexibility, Decreased range of motion, Decreased strength, Increased muscle spasms, Decreased activity tolerance, Postural dysfunction, Improper body mechanics  Visit Diagnosis: Chronic bilateral low back pain without sciatica  Muscle weakness (generalized)  Other symptoms and signs involving the musculoskeletal system     Problem List Patient Active Problem List   Diagnosis Date Noted  . Chest pain 04/27/2015  . Esophageal reflux 10/05/2012  . Non-ST elevation MI (NSTEMI) ([place]) 09/18/2012  . S/P CABG (coronary artery bypass graft) 09/18/2012  . Dyslipidemia 09/18/2012  . Sinus arrhythmia 09/18/2012  . Essential hypertension, benign 09/18/2012  . Hypothyroidism 09/18/2012  . Insomnia 09/18/2012    Bess Harvest, PTA 02/18/17 8:54 AM  [facility] [address]  [place], [place], [address] Phone: [phone]   Fax:  [phone]  Name: Jeffrey Rhodes MRN: [id number] Date of Birth: 26-Jul-1940

## 2017-02-21 ENCOUNTER — Ambulatory Visit: Payer: Medicare Other

## 2017-02-21 DIAGNOSIS — M6281 Muscle weakness (generalized): Secondary | ICD-10-CM

## 2017-02-21 DIAGNOSIS — G8929 Other chronic pain: Secondary | ICD-10-CM | POA: Diagnosis not present

## 2017-02-21 DIAGNOSIS — M545 Low back pain: Principal | ICD-10-CM

## 2017-02-21 DIAGNOSIS — R29898 Other symptoms and signs involving the musculoskeletal system: Secondary | ICD-10-CM | POA: Diagnosis not present

## 2017-02-21 NOTE — Therapy (Addendum)
[facility] [address]  [place], [place], [address] Phone: [phone]   Fax:  [phone]  Physical Therapy Treatment  Patient Details  Name: Jeffrey Rhodes MRN: [id number] Date of Birth: Dec 21, 1940 Referring Provider: Riki Sheer, DO   Encounter Date: 02/21/2017  PT End of Session - 02/21/17 0800    Visit Number  10    Number of Visits  12    Date for PT Re-Evaluation  03/04/17    Authorization Type  Medicare & AARP    PT Start Time  0755    PT Stop Time  0857    PT Time Calculation (min)  62 min    Activity Tolerance  Patient tolerated treatment well    Behavior During Therapy  [facility] for tasks assessed/performed       Past Medical History:  Diagnosis Date  . Allergy   . Arthritis   . Coronary artery disease   . High cholesterol   . Hypertension   . Hypothyroid   . Myocardial infarction [facility])     Past Surgical History:  Procedure Laterality Date  . CORONARY ARTERY BYPASS GRAFT  2014   [facility]  . DENTAL RESTORATION/EXTRACTION WITH X-RAY      There were no vitals filed for this visit.  Subjective Assessment - 02/21/17 0756    Subjective  Pt. doing well today  - no new complaints.      Patient Stated Goals  "to not have no back aches"    Currently in Pain?  Yes    Pain Score  -- .5/10    Pain Location  Back    Pain Orientation  Lower;Left    Pain Descriptors / Indicators  Aching;Constant    Pain Type  Acute pain    Pain Onset  More than a month ago    Pain Frequency  Constant         [facility] PT Assessment - 02/21/17 0824      Observation/Other Assessments   Focus on Therapeutic Outcomes (FOTO)   76% (24% limitation)      AROM   AROM Assessment Site  Lumbar    Lumbar Flexion  hands to just above ankles    Lumbar Extension  50% limited  1/10 L LBP at end range     Lumbar - Right Side Bend  [facility]    Lumbar - Left Side Bend  WFL 1/10 L LBP at end range     Lumbar - Right Rotation   25%     Lumbar - Left Rotation  25% limited       Strength   Strength Assessment Site  Hip;Knee    Right/Left Hip  Right;Left    Right Hip Flexion  4/5    Right Hip Extension  4/5    Right Hip External Rotation   4/5    Right Hip Internal Rotation  4/5    Right Hip ABduction  4+/5    Right Hip ADduction  4/5    Left Hip Flexion  4/5    Left Hip Extension  4/5    Left Hip External Rotation  4+/5    Left Hip Internal Rotation  4/5    Left Hip ABduction  4/5    Left Hip ADduction  4+/5    Right/Left Knee  Right;Left    Right Knee Flexion  5/5    Right Knee Extension  5/5    Left Knee Flexion  5/5  Left Knee Extension  5/5                  [facility] Adult PT Treatment/Exercise - 02/21/17 0808      Lumbar Exercises: Stretches   Lower Trunk Rotation  2 reps;30 seconds    Lower Trunk Rotation Limitations  LE on orange p-ball       Lumbar Exercises: Aerobic   Stationary Bike  L3 x 6'      Lumbar Exercises: Machines for Strengthening   Leg Press  25# x 15 reps     Other Lumbar Machine Exercise  BATCA cable pallof press 5# x 10 reps     Other Lumbar Machine Exercise  BATCA B single arm cable row 5# in staggered stance x 10 reps       Lumbar Exercises: Seated   Other Seated Lumbar Exercises  B shoulder extension with red TB seated on green P-ball x 15 reps; narrow stance     Other Seated Lumbar Exercises  B single arm row with red TB x 10 reps seated on green p-ball  in narrow stance       Lumbar Exercises: Supine   Isometric Hip Flexion  15 reps;5 seconds      Lumbar Exercises: Sidelying   Hip Abduction  15 reps    Hip Abduction Limitations  B; tactile cueing to maintain positioning      Modalities   Modalities  Moist Heat      Moist Heat Therapy   Number Minutes Moist Heat  15 Minutes    Moist Heat Location  Lumbar Spine               PT Short Term Goals - 01/28/17 0801      PT SHORT TERM GOAL #1   Title  Independent witth initial HEP    Status   Achieved        PT Long Term Goals - 02/21/17 6283      PT LONG TERM GOAL #1   Title  Independent with ongoing HEP +/- gym program    Status  Partially Met met for current HEP      PT LONG TERM GOAL #2   Title  Lumbar ROM WFL w/o increased pain    Status  Partially Met      PT LONG TERM GOAL #3   Title  B proximal LE strength >/= 4/5 to 4+/5    Status  Achieved      PT LONG TERM GOAL #4   Title  Pt will verbalize/demonstrate understanding of good posture and body mechanics with typical daily tasks to reduce low back strain    Status  Partially Met Pt. verbalizing increased awareness of posture and body mechanics with typical daily tasks      PT LONG TERM GOAL #5   Title  Pt will report ability to complete household chores and job task with 50% reduction in low back pain     Status  Achieved 90% Improvement             Plan - 02/21/17 0807    Clinical Impression Statement  Gavin making good progress with therapy thus far reporting 90% improvement in LBP with household chores and job tasks.  Demonstrating improved LE strength today with MMT able to meet strength goal.  Able to demo improved lumbopelvic control with therex now in treatment and tolerating advancement of strengthening activities well with primary limitation being B knee pain with squatting/kneeling type  activities.  Verbalizing increased awareness of posture and body mechanics with daily activities and household tasks now.  Partially meeting lumbar AROM goal still most limited in B lumbar rotation and extension.  Jihaad on track to meet remaining goals.      PT Treatment/Interventions  Patient/family education;ADLs/Self Care Home Management;Neuromuscular re-education;Therapeutic exercise;Therapeutic activities;Functional mobility training;Manual techniques;Dry needling;Taping;Electrical Stimulation;Moist Heat;Cryotherapy;Traction;Iontophoresis 53m/ml Dexamethasone    PT Next Visit Plan  MD note for upcoming f/u on  1.7.19    Consulted and Agree with Plan of Care  Patient       Patient will benefit from skilled therapeutic intervention in order to improve the following deficits and impairments:  Pain, Impaired flexibility, Decreased range of motion, Decreased strength, Increased muscle spasms, Decreased activity tolerance, Postural dysfunction, Improper body mechanics  Visit Diagnosis: Chronic bilateral low back pain without sciatica  Muscle weakness (generalized)  Other symptoms and signs involving the musculoskeletal system     Problem List Patient Active Problem List   Diagnosis Date Noted  . Chest pain 04/27/2015  . Esophageal reflux 10/05/2012  . [place] 09/18/[place]  . S/P CABG (coronary artery bypass graft) 09/18/2012  . Dyslipidemia 09/18/2012  . Sinus arrhythmia 09/18/2012  . Essential hypertension, benign 09/18/2012  . Hypothyroidism 09/18/2012  . Insomnia 09/18/2012    MBess Harvest PTA 101/13/1912:27 PM  [facility] [address] [place] [address]Phone: [phone]  Fax:  [phone] Name: JGurkirat BasherMRN: [id number] of Birth: [birth year]  G-Codes - 12019/01/13   Functional Assessment Tool Used (Outpatient Only)  Lumbar spine FOTO = 76% (24% limitation)    Functional Limitation  Mobility: Walking and moving around    Mobility: Walking and Moving Around Current Status [phone]  At least 20 percent but less than 40 percent impaired, limited or restricted    Mobility: Walking and Moving Around Goal Status [phone]  At least 20 percent but less than 40 percent impaired, limited or restricted    JPercival Spanish PT, MPT 02/25/17, 9:03 AM  [facility] [place] [address]Phone: [phone]  Fax:  [phone]

## 2017-02-23 ENCOUNTER — Telehealth: Payer: Self-pay | Admitting: Family Medicine

## 2017-02-23 NOTE — Telephone Encounter (Signed)
Jeffrey Rhodes dropped off [employer] and will like to have it faxed and also she will pick up as well for records.   Papers was placed in front office tray

## 2017-02-25 ENCOUNTER — Ambulatory Visit: Payer: Medicare Other | Attending: Family Medicine | Admitting: Physical Therapy

## 2017-02-25 ENCOUNTER — Encounter: Payer: Self-pay | Admitting: Physical Therapy

## 2017-02-25 DIAGNOSIS — M6281 Muscle weakness (generalized): Secondary | ICD-10-CM | POA: Diagnosis not present

## 2017-02-25 DIAGNOSIS — M545 Low back pain: Secondary | ICD-10-CM | POA: Diagnosis not present

## 2017-02-25 DIAGNOSIS — G8929 Other chronic pain: Secondary | ICD-10-CM | POA: Diagnosis not present

## 2017-02-25 DIAGNOSIS — R29898 Other symptoms and signs involving the musculoskeletal system: Secondary | ICD-10-CM

## 2017-02-25 NOTE — Therapy (Signed)
[facility] [address]  [place], [place], [address] Phone: [phone]   Fax:  [phone]  Physical Therapy Treatment  Patient Details  Name: Jeffrey Rhodes MRN: [id number] Date of Birth: 10/24/1940 Referring Provider: Riki Sheer, DO   Encounter Date: 02/25/2017  PT End of Session - 02/25/17 0800    Visit Number  11    Number of Visits  12    Date for PT Re-Evaluation  03/04/17    Authorization Type  Medicare & AARP    PT Start Time  0800    PT Stop Time  0846    PT Time Calculation (min)  46 min    Activity Tolerance  Patient tolerated treatment well    Behavior During Therapy  [facility] for tasks assessed/performed       Past Medical History:  Diagnosis Date  . Allergy   . Arthritis   . Coronary artery disease   . High cholesterol   . Hypertension   . Hypothyroid   . Myocardial infarction [facility])     Past Surgical History:  Procedure Laterality Date  . CORONARY ARTERY BYPASS GRAFT  2014   [facility]  . DENTAL RESTORATION/EXTRACTION WITH X-RAY      There were no vitals filed for this visit.  Subjective Assessment - 02/25/17 0805    Subjective  Pt continues to report very low level pain in L low back, unchanged over recent visits.    Patient Stated Goals  "to not have no back aches"    Currently in Pain?  Yes    Pain Score  1     Pain Location  Back    Pain Orientation  Lower;Left    Pain Descriptors / Indicators  Aching;Constant    Pain Type  Acute pain    Pain Frequency  Constant         [facility] PT Assessment - 02/25/17 0800      Assessment   Medical Diagnosis  Chronic low back pain w/o sciatica    Referring Provider  Riki Sheer, DO    Onset Date/Surgical Date  08/25/16    Next MD Visit  02/28/17      Strength   Right Hip Flexion  4/5    Right Hip Extension  4/5    Right Hip External Rotation   4/5    Right Hip Internal Rotation  4/5    Right Hip ABduction  4+/5    Right Hip  ADduction  4/5    Left Hip Flexion  4/5    Left Hip Extension  4/5    Left Hip External Rotation  4+/5    Left Hip Internal Rotation  4/5    Left Hip ABduction  4/5    Left Hip ADduction  4+/5    Right Knee Flexion  5/5    Right Knee Extension  5/5    Left Knee Flexion  5/5    Left Knee Extension  5/5                  [facility] Adult PT Treatment/Exercise - 02/25/17 0800      Exercises   Exercises  Lumbar      Lumbar Exercises: Stretches   Quadruped Mid Back Stretch  30 seconds;3 reps    Quadruped Mid Back Stretch Limitations  seated 3-way prayer stretch      Lumbar Exercises: Aerobic   Stationary Bike  L3 x 6'  Lumbar Exercises: Supine   Bridge  10 reps;5 seconds    Bridge Limitations  + alt hip ABD/ER with green TB             PT Education - 02/25/17 0845    Education provided  Yes    Education Details  HEP review & update    Person(s) Educated  Patient    Methods  Explanation;Demonstration;Handout    Comprehension  Verbalized understanding;Returned demonstration       PT Short Term Goals - 01/28/17 0801      PT SHORT TERM GOAL #1   Title  Independent witth initial HEP    Status  Achieved        PT Long Term Goals - 02/25/17 0810      PT LONG TERM GOAL #1   Title  Independent with ongoing HEP +/- gym program    Status  Partially Met met for current HEP      PT LONG TERM GOAL #2   Title  Lumbar ROM WFL w/o increased pain    Status  Partially Met      PT LONG TERM GOAL #3   Title  B proximal LE strength >/= 4/5 to 4+/5    Status  Achieved      PT LONG TERM GOAL #4   Title  Pt will verbalize/demonstrate understanding of good posture and body mechanics with typical daily tasks to reduce low back strain    Status  Achieved      PT LONG TERM GOAL #5   Title  Pt will report ability to complete household chores and job task with 50% reduction in low back pain     Status  Achieved 90% Improvement             Plan - 02/25/17 0807     Clinical Impression Statement  Lisa very pleased with his progress with PT noting 90% improvement in LBP with improved tolerance for daily household chores and job tasks. Remaining LBP at very low level (typically 0.5-1/10) on L at belt line. Goals mostly met and pt feels ready to transition to HEP, therefore HEP reviewed & updated today. Will plan for final review and assessment on next visit, with anticipated transition to HEP at that time.    Rehab Potential  Good    PT Treatment/Interventions  Patient/family education;ADLs/Self Care Home Management;Neuromuscular re-education;Therapeutic exercise;Therapeutic activities;Functional mobility training;Manual techniques;Dry needling;Taping;Electrical Stimulation;Moist Heat;Cryotherapy;Traction;Iontophoresis 75m/ml Dexamethasone    Consulted and Agree with Plan of Care  Patient       Patient will benefit from skilled therapeutic intervention in order to improve the following deficits and impairments:  Pain, Impaired flexibility, Decreased range of motion, Decreased strength, Increased muscle spasms, Decreased activity tolerance, Postural dysfunction, Improper body mechanics  Visit Diagnosis: Chronic bilateral low back pain without sciatica  Muscle weakness (generalized)  Other symptoms and signs involving the musculoskeletal system     Problem List Patient Active Problem List   Diagnosis Date Noted  . Chest pain 04/27/2015  . Esophageal reflux 10/05/2012  . [place] 09/18[place]2012  . S/P CABG (coronary artery bypass graft) 09/18/2012  . Dyslipidemia 09/18/2012  . Sinus arrhythmia 09/18/2012  . Essential hypertension, benign 09/18/2012  . Hypothyroidism 09/18/2012  . Insomnia 09/18/2012    JPercival Spanish PT, MPT 02/25/2017, 8:59 AM  [facility] [place] [address]Phone: [phone]  Fax:  [phone]  Name: Jeffrey Rhodes MRN: [id number] Date of Birth: [birth year]

## 2017-02-25 NOTE — Telephone Encounter (Signed)
Completed as much as possible; forwarded to provider/SLS 01/04

## 2017-02-28 ENCOUNTER — Ambulatory Visit: Payer: Medicare Other | Admitting: Physical Therapy

## 2017-02-28 ENCOUNTER — Encounter: Payer: Self-pay | Admitting: Family Medicine

## 2017-02-28 ENCOUNTER — Encounter: Payer: Self-pay | Admitting: Physical Therapy

## 2017-02-28 ENCOUNTER — Ambulatory Visit (INDEPENDENT_AMBULATORY_CARE_PROVIDER_SITE_OTHER): Payer: Medicare Other | Admitting: Family Medicine

## 2017-02-28 VITALS — BP 112/68 | HR 72 | Temp 98.3°F | Ht 71.0 in | Wt 222.5 lb

## 2017-02-28 DIAGNOSIS — M545 Low back pain: Secondary | ICD-10-CM | POA: Diagnosis not present

## 2017-02-28 DIAGNOSIS — G8929 Other chronic pain: Secondary | ICD-10-CM

## 2017-02-28 DIAGNOSIS — M6281 Muscle weakness (generalized): Secondary | ICD-10-CM | POA: Diagnosis not present

## 2017-02-28 DIAGNOSIS — G4700 Insomnia, unspecified: Secondary | ICD-10-CM | POA: Diagnosis not present

## 2017-02-28 DIAGNOSIS — R29898 Other symptoms and signs involving the musculoskeletal system: Secondary | ICD-10-CM

## 2017-02-28 MED ORDER — ZOLPIDEM TARTRATE 5 MG PO TABS: 5.0000 mg | ORAL_TABLET | Freq: Every evening | ORAL | 0 refills | Status: DC | PRN

## 2017-02-28 MED ORDER — SUVOREXANT 10 MG PO TABS: 10.0000 mg | ORAL_TABLET | Freq: Every evening | ORAL | 0 refills | Status: DC | PRN

## 2017-02-28 NOTE — Therapy (Addendum)
[facility] [place] [facility] [address]  [place], [place], [address] Phone: [phone]   Fax:  [phone]  Physical Therapy Treatment  Patient Details  Name: Jeffrey Rhodes MRN: [id number] Date of Birth: December 14, 1940 Referring Provider: Riki Sheer, DO   Encounter Date: 02/28/2017  PT End of Session - 02/28/17 1310    Visit Number  12    Number of Visits  12    Date for PT Re-Evaluation  03/04/17    Authorization Type  Medicare & AARP    PT Start Time  1310    PT Stop Time  1400    PT Time Calculation (min)  50 min    Activity Tolerance  Patient tolerated treatment well    Behavior During Therapy  [facility] for tasks assessed/performed       Past Medical History:  Diagnosis Date  . Allergy   . Arthritis   . Coronary artery disease   . High cholesterol   . Hypertension   . Hypothyroid   . Myocardial infarction [facility])     Past Surgical History:  Procedure Laterality Date  . CORONARY ARTERY BYPASS GRAFT  2014   [place]  . DENTAL RESTORATION/EXTRACTION WITH X-RAY      There were no vitals filed for this visit.  Subjective Assessment - 02/28/17 1313    Subjective  Pt reporting very mild pain today - "not even quite a 1/10". States MD pleased with progress with PT and pt feels ready to proceed with transition to HEP.    Patient Stated Goals  "to not have no back aches"    Currently in Pain?  Yes    Pain Score  -- <1/10    Pain Location  Back    Pain Orientation  Lower;Left    Pain Descriptors / Indicators  Aching    Pain Type  Acute pain    Pain Onset  More than a month ago    Pain Frequency  Intermittent    Multiple Pain Sites  No         [facility] PT Assessment - 02/28/17 1310      Assessment   Medical Diagnosis  Chronic low back pain w/o sciatica    Referring Provider  Riki Sheer, DO    Onset Date/Surgical Date  08/25/16    Next MD Visit  04/11/17      Observation/Other Assessments   Focus on  Therapeutic Outcomes (FOTO)   94% (6% limitation)      AROM   Lumbar Flexion  hands to ankles    Lumbar Extension  50% 1/10 L LBP at end range     Lumbar - Right Side Bend  [facility]    Lumbar - Left Side Bend  WFL 1/10 L LBP at end range     Lumbar - Right Rotation  [facility]    Lumbar - Left Rotation  [facility]      Strength   Right Hip Flexion  4+/5    Right Hip Extension  4+/5    Right Hip External Rotation   4+/5    Right Hip Internal Rotation  4+/5    Right Hip ABduction  4+/5    Right Hip ADduction  4+/5    Left Hip Flexion  4+/5    Left Hip Extension  4+/5    Left Hip External Rotation  4+/5    Left Hip Internal Rotation  4+/5    Left Hip ABduction  4+/5  Left Hip ADduction  4+/5    Right Knee Flexion  5/5    Right Knee Extension  5/5    Left Knee Flexion  5/5    Left Knee Extension  5/5                  [facility] Adult PT Treatment/Exercise - 02/28/17 1310      Exercises   Exercises  Lumbar      Lumbar Exercises: Aerobic   Stationary Bike  L3 x 6'      Lumbar Exercises: Machines for Strengthening   Cybex Lumbar Extension  B LE 35# x15    Cybex Knee Extension  B LE 20# x15    Leg Press  B LE 25# x15    Other Lumbar Machine Exercise  BATCA low row 20# x15      Lumbar Exercises: Standing   Row  Both;15 reps;Theraband;Strengthening    Theraband Level (Row)  Level 3 (Green)    Row Limitations  staggered stance    Shoulder Extension  Both;15 reps;Theraband;Strengthening    Theraband Level (Shoulder Extension)  Level 3 (Green)    Shoulder Extension Limitations  staggered stance             PT Education - 02/28/17 1400    Education provided  Yes    Education Details  HEP & gym program review    Person(s) Educated  Patient    Methods  Explanation;Demonstration    Comprehension  Verbalized understanding;Returned demonstration       PT Short Term Goals - 01/28/17 0801      PT SHORT TERM GOAL #1   Title  Independent witth initial HEP    Status  Achieved         PT Long Term Goals - 02/28/17 1320      PT LONG TERM GOAL #1   Title  Independent with ongoing HEP +/- gym program    Status  Achieved      PT LONG TERM GOAL #2   Title  Lumbar ROM WFL w/o increased pain    Status  Partially Met      PT LONG TERM GOAL #3   Title  B proximal LE strength >/= 4/5 to 4+/5    Status  Achieved      PT LONG TERM GOAL #4   Title  Pt will verbalize/demonstrate understanding of good posture and body mechanics with typical daily tasks to reduce low back strain    Status  Achieved      PT LONG TERM GOAL #5   Title  Pt will report ability to complete household chores and job task with 50% reduction in low back pain     Status  Achieved 90% Improvement             Plan - 02/28/17 1404    Clinical Impression Statement  Jeffrey Rhodes reporting no concerns with recent HEP addition, Discussed frequency for ongoing HEP, recommending daily performance of stretches but weaning strengthening exercises to ~3x/wk on average. Also reviewed appropriate gym equipment as part of ongoing exercise program. All goals met except ROM goal due to very mild pain with extension & L sidebending. Pt feels ready to transition to HEP as planned, but will place pt on 30 day hold in the event that issues arise during transition to HEP/gym program.    Rehab Potential  Good    PT Treatment/Interventions  Patient/family education;ADLs/Self Care Home Management;Neuromuscular re-education;Therapeutic exercise;Therapeutic activities;Functional mobility training;Manual techniques;Dry  needling;Taping;Electrical Stimulation;Moist Heat;Cryotherapy;Traction;Iontophoresis 23m/ml Dexamethasone    PT Next Visit Plan  30 day hold    Consulted and Agree with Plan of Care  Patient       Patient will benefit from skilled therapeutic intervention in order to improve the following deficits and impairments:  Pain, Impaired flexibility, Decreased range of motion, Decreased strength, Increased muscle spasms,  Decreased activity tolerance, Postural dysfunction, Improper body mechanics  Visit Diagnosis: Chronic bilateral low back pain without sciatica  Muscle weakness (generalized)  Other symptoms and signs involving the musculoskeletal system     Problem List Patient Active Problem List   Diagnosis Date Noted  . Chest pain 04/27/2015  . Esophageal reflux 10/05/2012  . Non-ST [place] 09/18/[place]  . S/P CABG (coronary artery bypass graft) 09/18/2012  . Dyslipidemia 09/18/2012  . Sinus arrhythmia 09/18/2012  . Essential hypertension, benign 09/18/2012  . Hypothyroidism 09/18/2012  . Insomnia 09/18/2012    JPercival Spanish PT, MPT 02/28/2017, 2:15 PM  [facility] [place] [address]Phone: [phone]  Fax:  [phone] Name: Jeffrey RendellMRN: [id number] of Birth: 103-06-1940  PHYSICAL THERAPY DISCHARGE SUMMARY  Visits from Start of Care: 12  Current functional level related to goals / functional outcomes:   Refer to above clinical impression for status as of last visit on 02/28/17. Pt was placed on hold for 30 days and has not needed to return to PT, therefore will proceed with discharge from PT for this episode.   Remaining deficits:   As above.   Education / Equipment:   HEP  Plan: Patient agrees to discharge.  Patient goals were met. Patient is being discharged due to being pleased with the current functional level.  ?????       JPercival Spanish PT, MPT 04/04/17, 2:34 PM  [facility] [place] [address]Phone: [phone]  Fax:  [phone]

## 2017-02-28 NOTE — Patient Instructions (Addendum)
Try this new medicine. If no improvement, call and we will increase the dose.   I want you to think about anxiety as this is a common cause for trouble sleeping.  Stop the Remeron.  Do not fill any new medicine if it is too expensive. Let us know if this is the case.    Let us know if you need anything.

## 2017-02-28 NOTE — Progress Notes (Signed)
Chief Complaint  Patient presents with  . Back Pain    Subjective: Patient is a 77 y.o. male here for f/u sleep.  He came to me on Ambien, we have been trying to wean down/try other things. He failed Trazodone, doxepin, and most recently Remeron. He was given prn Ambien, but was not helpful. He does have stressors in life such as having his work hours cut down. He has appt with [facility] team next week for CBT. Sleep has not been going well. He does not snore. He is adhering to proper sleep hygiene.  ROS: Psych: As noted in HPI   Past Medical History:  Diagnosis Date  . Allergy   . Arthritis   . Coronary artery disease   . High cholesterol   . Hypertension   . Hypothyroid   . Myocardial infarction (HCC)     Objective: BP 112/68 (BP Location: Left Arm, Patient Position: Sitting, Cuff Size: Large)   Pulse 72   Temp 98.3 F (36.8 C) (Oral)   Ht 5\' 11"  (1.803 m)   Wt 222 lb 8 oz (100.9 kg)   SpO2 96%   BMI 31.03 kg/m  General: Awake, appears stated age HEENT: MMM, EOMi Heart: RRR Lungs: CTAB, no rales, wheezes or rhonchi. No accessory muscle use Psych: Age appropriate judgment and insight, normal affect and mood  Assessment and Plan: Insomnia, unspecified type - Plan: zolpidem (AMBIEN) 5 MG tablet, Suvorexant (BELSOMRA) 10 MG TABS  Orders as above. Change Remeron to Belsomra, he will call in 1-2 weeks and let us know if Belsomra is helping, if not will increase to 20 mg/d. CBT appt next week. Cont Ambien for back up. Discussed starting tx for situational anxiety, he declined at this time and would like to see what [facility] team next week says.  F/u in 6 weeks. If no improvement, will refer to sleep team.  The patient voiced understanding and agreement to the plan.  [place], DO 02/28/17  7:35 AM

## 2017-02-28 NOTE — Progress Notes (Signed)
Pre visit review using our clinic review tool, if applicable. No additional management support is needed unless otherwise documented below in the visit note. 

## 2017-03-04 ENCOUNTER — Ambulatory Visit: Payer: Medicare Other | Admitting: Psychology

## 2017-03-10 ENCOUNTER — Ambulatory Visit (INDEPENDENT_AMBULATORY_CARE_PROVIDER_SITE_OTHER): Payer: Medicare Other | Admitting: Psychology

## 2017-03-10 DIAGNOSIS — F4322 Adjustment disorder with anxiety: Secondary | ICD-10-CM | POA: Diagnosis not present

## 2017-03-10 DIAGNOSIS — F5101 Primary insomnia: Secondary | ICD-10-CM | POA: Diagnosis not present

## 2017-03-28 DIAGNOSIS — H40029 Open angle with borderline findings, high risk, unspecified eye: Secondary | ICD-10-CM | POA: Diagnosis not present

## 2017-04-01 ENCOUNTER — Ambulatory Visit (INDEPENDENT_AMBULATORY_CARE_PROVIDER_SITE_OTHER): Payer: Medicare Other | Admitting: Psychology

## 2017-04-01 DIAGNOSIS — F4322 Adjustment disorder with anxiety: Secondary | ICD-10-CM | POA: Diagnosis not present

## 2017-04-01 DIAGNOSIS — F5101 Primary insomnia: Secondary | ICD-10-CM

## 2017-04-03 IMAGING — US US AORTA SCREENING (MEDICARE)
1 series · 7 of 7 positions shown · non-contrast
Comparison: None.

CLINICAL DATA: Screening exam

EXAM:
US ABDOMINAL AORTA MEDICARE SCREENING
TECHNIQUE: Ultrasound examination of the abdominal aorta was performed as a
screening evaluation for abdominal aortic aneurysm.

[Series 1: us aorta screening (medicare) · 0.21mm/px · 7 of 7 slices shown]
[im 1/7]
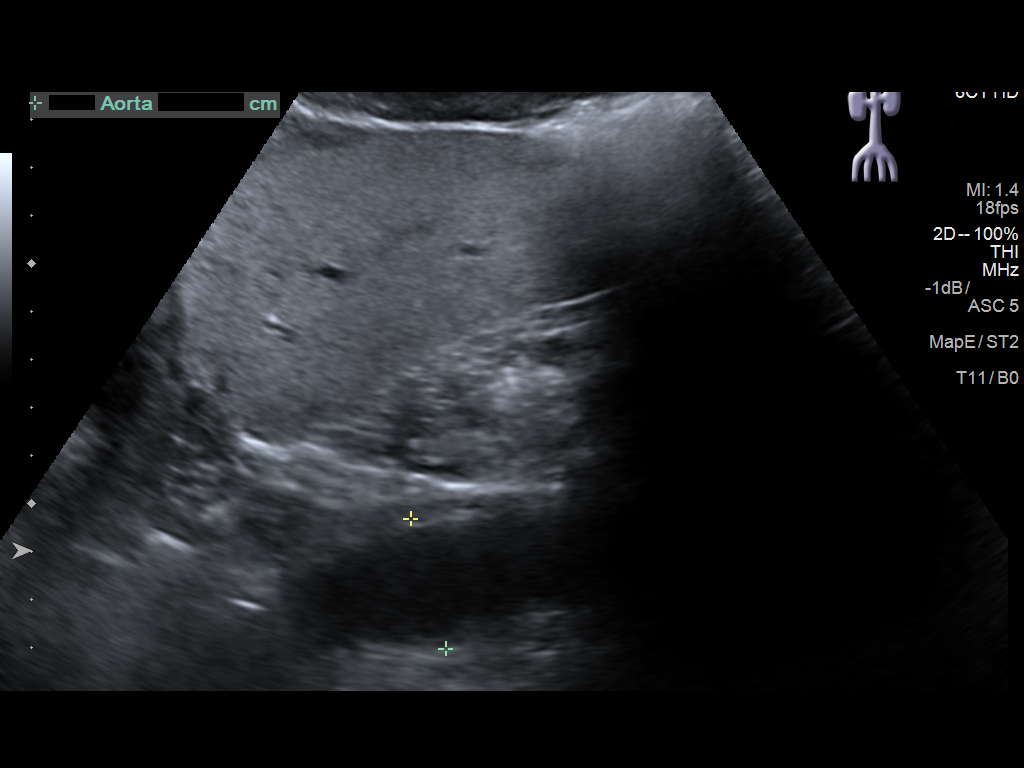
[im 2/7]
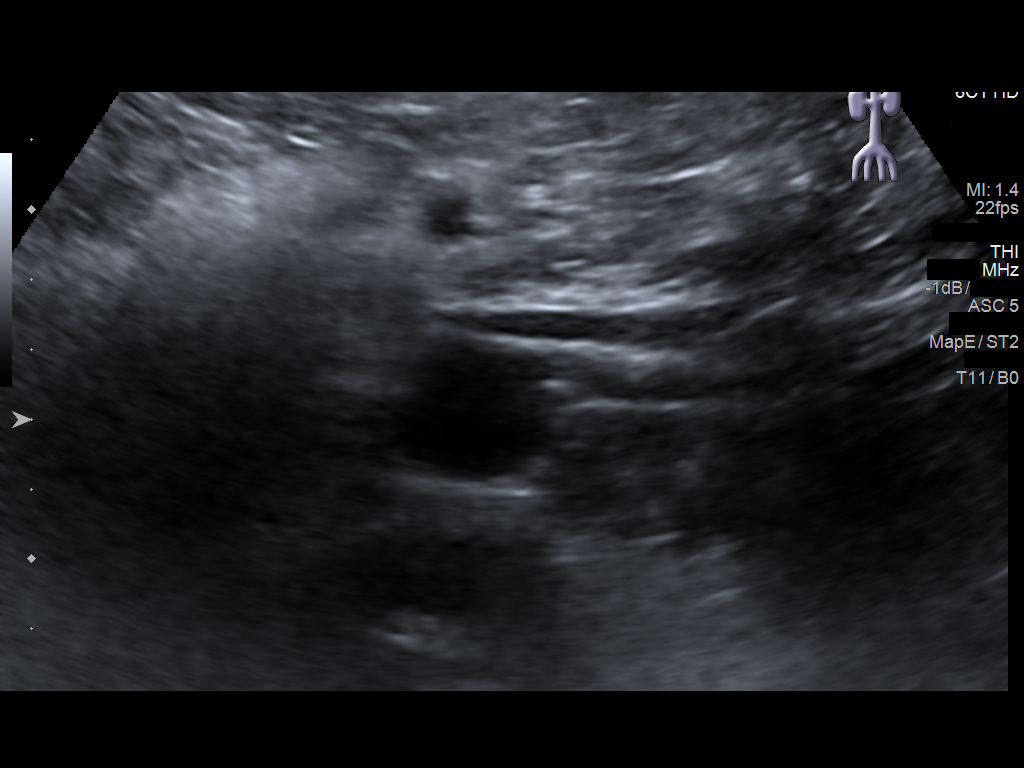
[im 3/7]
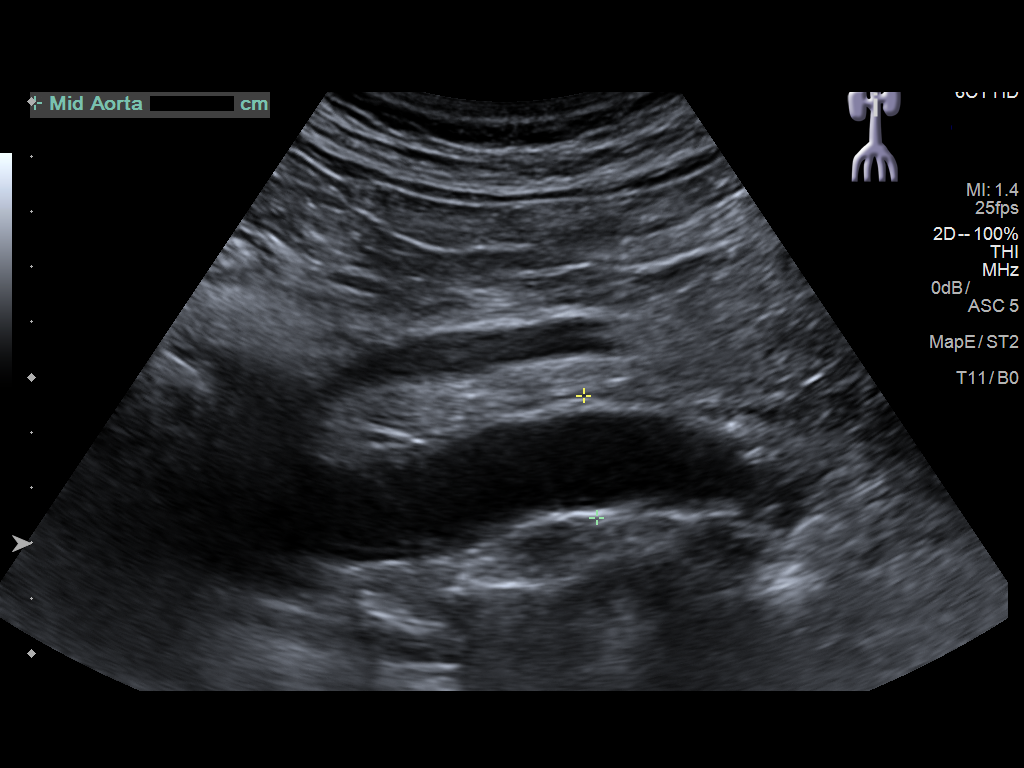
[im 4/7]
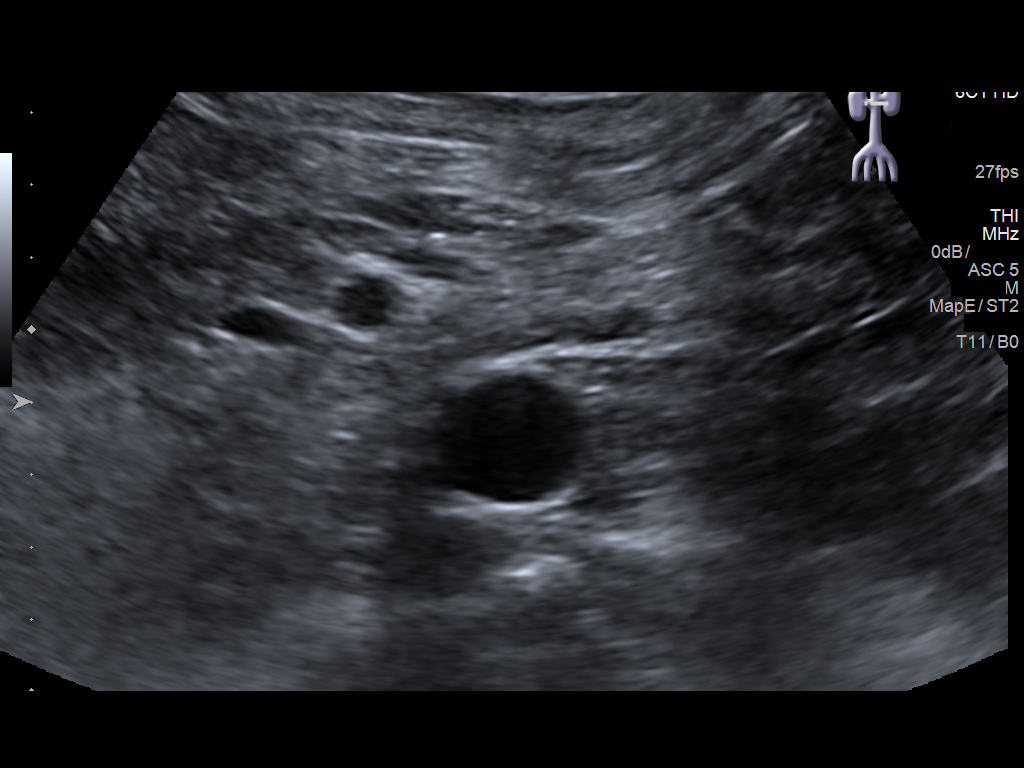
[im 5/7]
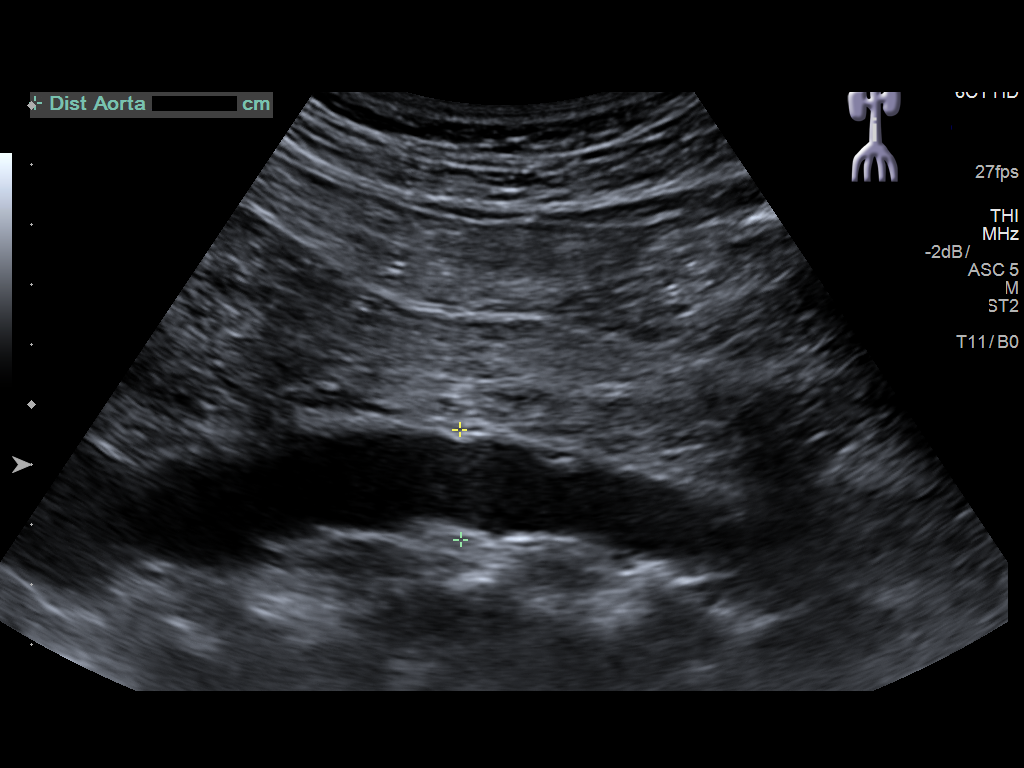
[im 6/7]
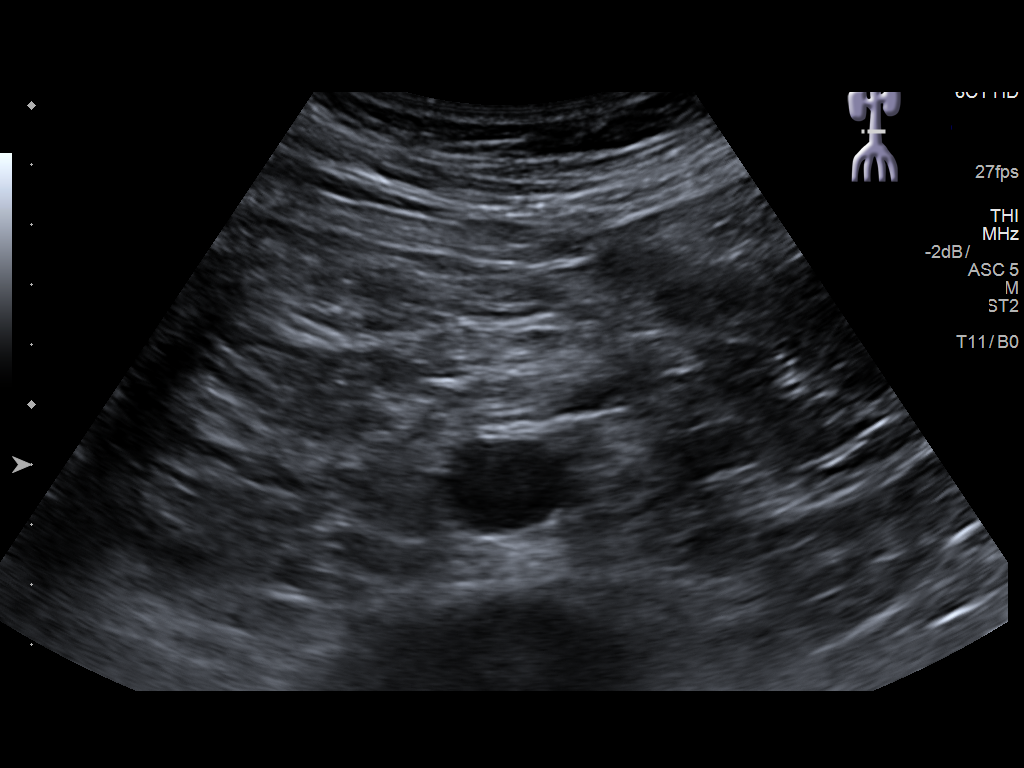
[im 7/7]
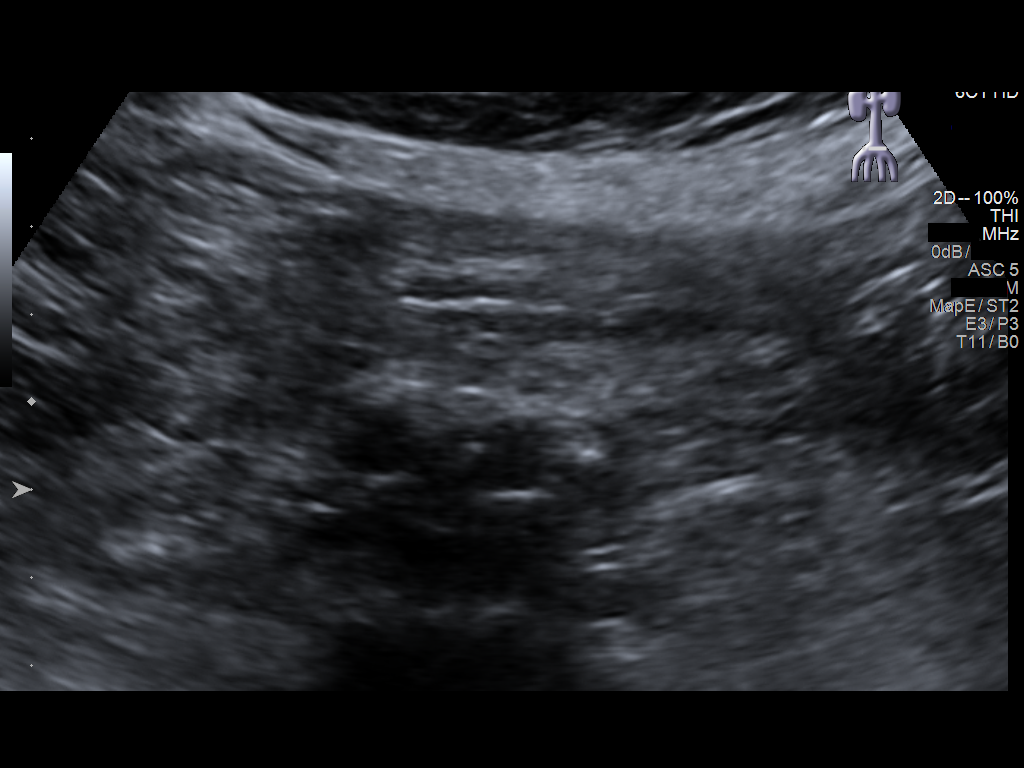

[7 of 7 positions shown; findings below may reference images not displayed]

FINDINGS: Maximum Diameter: 2.8 cm

ABDOMINAL AORTA

Proximal:  2.8 cm

Mid:  2.2 cm

Distal:  1.8 cm
IMPRESSION: The aorta is ectatic with a maximal diameter of 2.8 cm. Ectatic
abdominal aorta at risk for aneurysm development. Recommend followup
by ultrasound in 5 years. This recommendation follows ACR consensus
guidelines: White Paper of the ACR Incidental Findings Committee II

## 2017-04-11 ENCOUNTER — Ambulatory Visit (INDEPENDENT_AMBULATORY_CARE_PROVIDER_SITE_OTHER): Payer: Medicare Other | Admitting: Family Medicine

## 2017-04-11 ENCOUNTER — Encounter: Payer: Self-pay | Admitting: Family Medicine

## 2017-04-11 DIAGNOSIS — G4700 Insomnia, unspecified: Secondary | ICD-10-CM

## 2017-04-11 MED ORDER — SUVOREXANT 20 MG PO TABS: 20.0000 mg | ORAL_TABLET | Freq: Every evening | ORAL | 0 refills | Status: DC | PRN

## 2017-04-11 MED ORDER — ZOLPIDEM TARTRATE 5 MG PO TABS: 5.0000 mg | ORAL_TABLET | Freq: Every evening | ORAL | 0 refills | Status: DC | PRN

## 2017-04-11 MED ORDER — TRAMADOL HCL 50 MG PO TABS: 50.0000 mg | ORAL_TABLET | Freq: Two times a day (BID) | ORAL | 0 refills | Status: DC | PRN

## 2017-04-11 NOTE — Patient Instructions (Addendum)
OK to take Tylenol 1000 mg (2 extra strength tabs) or 975 mg (3 regular strength tabs) every 6 hours as needed.  Ice/cold pack over area for 10-15 min twice daily.  Tramadol for breakthrough pain.  Keep seeing Terri!  Let us know if you need anything.

## 2017-04-11 NOTE — Progress Notes (Signed)
Pre visit review using our clinic review tool, if applicable. No additional management support is needed unless otherwise documented below in the visit note. 

## 2017-04-11 NOTE — Progress Notes (Signed)
Chief Complaint  Patient presents with  . Follow-up    sleep medication    Subjective: Patient is a 77 y.o. male here for f/u sleep.  Hx of insomnia, on Ambien when he got to me. We have been trying to wean him down, and he has cut down on freq and dosage taking 2.5-5 mg/night prn. He has failed low dose doxepin, melatonin, trazodone and recently was placed on suvorexant 10 mg/d and did not notice an improvement. Remelteon was not covered by insurance. He denies anxiety s/s's. Seeing Terri for CBT.    ROS: Psych: As noted in HPI   Past Medical History:  Diagnosis Date  . Allergy   . Arthritis   . Coronary artery disease   . High cholesterol   . Hypertension   . Hypothyroid   . Myocardial infarction (HCC)    Objective: BP 108/62 (BP Location: Left Arm, Patient Position: Sitting, Cuff Size: Large)   Pulse 72   Temp 98.2 F (36.8 C) (Oral)   Ht 5\' 11"  (1.803 m)   Wt 231 lb 2 oz (104.8 kg)   SpO2 96%   BMI 32.24 kg/m  General: Awake, appears stated age Lungs: No accessory muscle use Psych: Age appropriate judgment and insight, normal affect and mood  Assessment and Plan: Insomnia, unspecified type - Plan: zolpidem (AMBIEN) 5 MG tablet, Suvorexant (BELSOMRA) 20 MG TABS  Orders as above. Increase dose of Belsomra from 10 mg/d to 20 mg/d. Continue with CBT. OK to refill Ambien as he has been compliant with plan and is cutting down.  F/u in 3 mo at this time.  The patient voiced understanding and agreement to the plan.  [place], DO 04/11/17  8:43 AM

## 2017-04-27 ENCOUNTER — Ambulatory Visit (INDEPENDENT_AMBULATORY_CARE_PROVIDER_SITE_OTHER): Payer: Medicare Other | Admitting: Psychology

## 2017-04-27 DIAGNOSIS — F4322 Adjustment disorder with anxiety: Secondary | ICD-10-CM | POA: Diagnosis not present

## 2017-04-27 DIAGNOSIS — F5101 Primary insomnia: Secondary | ICD-10-CM | POA: Diagnosis not present

## 2017-05-04 DIAGNOSIS — J069 Acute upper respiratory infection, unspecified: Secondary | ICD-10-CM | POA: Diagnosis not present

## 2017-05-04 DIAGNOSIS — M5030 Other cervical disc degeneration, unspecified cervical region: Secondary | ICD-10-CM | POA: Diagnosis not present

## 2017-05-13 ENCOUNTER — Ambulatory Visit (INDEPENDENT_AMBULATORY_CARE_PROVIDER_SITE_OTHER): Payer: Medicare Other | Admitting: Psychology

## 2017-05-13 DIAGNOSIS — F4322 Adjustment disorder with anxiety: Secondary | ICD-10-CM

## 2017-05-13 DIAGNOSIS — F5101 Primary insomnia: Secondary | ICD-10-CM

## 2017-05-30 ENCOUNTER — Ambulatory Visit (INDEPENDENT_AMBULATORY_CARE_PROVIDER_SITE_OTHER): Payer: Medicare Other | Admitting: Psychology

## 2017-05-30 DIAGNOSIS — F5101 Primary insomnia: Secondary | ICD-10-CM

## 2017-05-30 DIAGNOSIS — F4322 Adjustment disorder with anxiety: Secondary | ICD-10-CM

## 2017-06-03 ENCOUNTER — Other Ambulatory Visit: Payer: Self-pay | Admitting: Family Medicine

## 2017-06-03 DIAGNOSIS — I10 Essential (primary) hypertension: Secondary | ICD-10-CM

## 2017-06-03 DIAGNOSIS — G4700 Insomnia, unspecified: Secondary | ICD-10-CM

## 2017-06-03 NOTE — Telephone Encounter (Signed)
Last refill for Zolpidem on 04/11/2017  #30 no refills Last office visit on 04/11/2017

## 2017-06-05 ENCOUNTER — Other Ambulatory Visit: Payer: Self-pay | Admitting: Family Medicine

## 2017-06-05 DIAGNOSIS — I10 Essential (primary) hypertension: Secondary | ICD-10-CM

## 2017-06-09 ENCOUNTER — Encounter: Payer: Self-pay | Admitting: Family Medicine

## 2017-06-09 ENCOUNTER — Ambulatory Visit (INDEPENDENT_AMBULATORY_CARE_PROVIDER_SITE_OTHER): Payer: Medicare Other | Admitting: Family Medicine

## 2017-06-09 VITALS — BP 108/68 | HR 60 | Temp 98.1°F | Ht 71.0 in | Wt 227.5 lb

## 2017-06-09 DIAGNOSIS — S1086XA Insect bite of other specified part of neck, initial encounter: Secondary | ICD-10-CM | POA: Diagnosis not present

## 2017-06-09 DIAGNOSIS — W57XXXA Bitten or stung by nonvenomous insect and other nonvenomous arthropods, initial encounter: Secondary | ICD-10-CM

## 2017-06-09 DIAGNOSIS — S70362A Insect bite (nonvenomous), left thigh, initial encounter: Secondary | ICD-10-CM | POA: Diagnosis not present

## 2017-06-09 DIAGNOSIS — S30861A Insect bite (nonvenomous) of abdominal wall, initial encounter: Secondary | ICD-10-CM

## 2017-06-09 MED ORDER — TRIAMCINOLONE ACETONIDE 0.1 % EX CREA: 1.0000 "application " | TOPICAL_CREAM | Freq: Two times a day (BID) | CUTANEOUS | 0 refills | Status: AC

## 2017-06-09 NOTE — Progress Notes (Signed)
Chief Complaint  Patient presents with  . Follow-up    rash    Jeffrey Rhodes is a 77 y.o. male here for a skin complaint.  Duration: 3 weeks Location: L thigh, L side, R side of neck Pruritic? Yes Painful? No Drainage? No New soaps/lotions/topicals/detergents? No Sick contacts? No Other associated symptoms: None Therapies tried thus far: lotion  ROS:  Const: No fevers Skin: As noted in HPI  Past Medical History:  Diagnosis Date  . Allergy   . Arthritis   . Coronary artery disease   . High cholesterol   . Hypertension   . Hypothyroid   . Myocardial infarction (HCC)    Allergies  Allergen Reactions  . Bee Venom Other (See Comments)    unknown  . Lisinopril     cough Other reaction(s): COUGH Other reaction(s): COUGH cough Other reaction(s): COUGH  . Losartan Cough     BP 108/68 (BP Location: Left Arm, Patient Position: Sitting, Cuff Size: Large)   Pulse 60   Temp 98.1 F (36.7 C) (Oral)   Ht 5\' 11"  (1.803 m)   Wt 227 lb 8 oz (103.2 kg)   SpO2 95%   BMI 31.73 kg/m  Gen: awake, alert, appearing stated age Lungs: No accessory muscle use Skin: 4 excoriated lesions on posterior thigh on L side there are non excoriated erythematous papules ; 4 discrete lesions on L side of abd wall, 4 other lesions on R side of neck; on closer exam there is a central excoriation suggestive of a bite. No drainage, TTP, fluctuance, excoriation Psych: Age appropriate judgment and insight  Bug bite, initial encounter - Plan: triamcinolone cream (KENALOG) 0.1 %  Orders as above. Put clothes through a dryer cycle, sheets too. PO antihistamine prn.  F/u prn for this, 6 mo for med check.  The patient voiced understanding and agreement to the plan.  [place], DO 06/09/17 11:02 AM

## 2017-06-09 NOTE — Patient Instructions (Addendum)
Use cream as needed. You can add an allergy medicine for better coverage if the itch is very bothersome.  Things to look out for: increasing pain not relieved by ibuprofen/acetaminophen, fevers, spreading redness, drainage of pus, or foul odor.  Put your clothing and sheets through a dryer cycle.  Try not to scratch.  Let us know if you need anything.

## 2017-06-09 NOTE — Progress Notes (Signed)
Pre visit review using our clinic review tool, if applicable. No additional management support is needed unless otherwise documented below in the visit note. 

## 2017-06-16 ENCOUNTER — Ambulatory Visit (INDEPENDENT_AMBULATORY_CARE_PROVIDER_SITE_OTHER): Payer: Medicare Other | Admitting: Psychology

## 2017-06-16 DIAGNOSIS — F4322 Adjustment disorder with anxiety: Secondary | ICD-10-CM

## 2017-06-16 DIAGNOSIS — F5101 Primary insomnia: Secondary | ICD-10-CM | POA: Diagnosis not present

## 2017-06-19 ENCOUNTER — Other Ambulatory Visit: Payer: Self-pay | Admitting: Cardiology

## 2017-06-19 DIAGNOSIS — I25810 Atherosclerosis of coronary artery bypass graft(s) without angina pectoris: Secondary | ICD-10-CM

## 2017-06-20 NOTE — Telephone Encounter (Signed)
Rx sent to pharmacy   

## 2017-06-24 ENCOUNTER — Ambulatory Visit (INDEPENDENT_AMBULATORY_CARE_PROVIDER_SITE_OTHER): Payer: Medicare Other | Admitting: Psychology

## 2017-06-24 DIAGNOSIS — F5101 Primary insomnia: Secondary | ICD-10-CM | POA: Diagnosis not present

## 2017-06-24 DIAGNOSIS — F4322 Adjustment disorder with anxiety: Secondary | ICD-10-CM

## 2017-06-27 DIAGNOSIS — H40029 Open angle with borderline findings, high risk, unspecified eye: Secondary | ICD-10-CM | POA: Diagnosis not present

## 2017-06-30 ENCOUNTER — Encounter: Payer: Self-pay | Admitting: Cardiology

## 2017-07-04 ENCOUNTER — Ambulatory Visit: Payer: Medicare Other | Admitting: Family Medicine

## 2017-07-04 ENCOUNTER — Ambulatory Visit: Payer: Medicare Other | Admitting: Psychology

## 2017-07-04 ENCOUNTER — Telehealth: Payer: Self-pay | Admitting: Family Medicine

## 2017-07-04 NOTE — Telephone Encounter (Signed)
Copied from [place] [phone]. Topic: Quick Communication - Rx Refill/Question >> Jul 04, 2017  4:16 PM Bea Graff, NT wrote: Medication: levothyroxine (SYNTHROID, LEVOTHROID) 137 MCG tablet Has the patient contacted their pharmacy? Yes.   (Agent: If no, request that the patient contact the pharmacy for the refill.) Preferred Pharmacy (with phone number or street name): [id number] - [place], [place] - [address] [phone] (Phone) [phone] (Fax)   Will be going out of town tomorrow for 8 days  Agent: Please be advised that RX refills may take up to 3 business days. We ask that you follow-up with your pharmacy.

## 2017-07-05 MED ORDER — LEVOTHYROXINE SODIUM 137 MCG PO TABS: 137.0000 ug | ORAL_TABLET | Freq: Every day | ORAL | 1 refills | Status: DC

## 2017-07-05 NOTE — Telephone Encounter (Signed)
Refill done as requested---patient informed

## 2017-07-05 NOTE — Addendum Note (Signed)
Addended by: Sharon Seller B on: 07/05/2017 03:09 PM   Modules accepted: Orders

## 2017-07-05 NOTE — Telephone Encounter (Signed)
Refill for levothyroxine 137 mcg  Filled by historical provider before. LR 04/27/15  Last TSH on 01/21/17  Was 4.60  Dr. Nani Ravens LOV 06/09/17  NOV  12/09/17

## 2017-07-11 ENCOUNTER — Ambulatory Visit: Payer: Self-pay | Admitting: Family Medicine

## 2017-07-13 ENCOUNTER — Ambulatory Visit: Payer: Self-pay | Admitting: Cardiology

## 2017-07-13 NOTE — Progress Notes (Signed)
HPI: Follow-up coronary artery disease. [place] In [place] 2017 he was admitted with chest pain and ruled out. His symptoms were felt to be atypical. Nuclear study showed an ejection fraction of 58%. No infarct or ischemia. Abd ultrasound 8/17 showed ectatic aorta measuring 2.8 cm. Since last seen the patient has dyspnea with more extreme activities but not with routine activities. It is relieved with rest. It is not associated with chest pain. There is no orthopnea, PND or pedal edema. There is no syncope or palpitations. There is no exertional chest pain.   Current Outpatient Medications  Medication Sig Dispense Refill  . acetaminophen (TYLENOL) 500 MG tablet Take 500 mg by mouth 2 (two) times daily.    Marland Kitchen amLODipine (NORVASC) 10 MG tablet TAKE 1 TABLET BY MOUTH EVERY DAY 90 tablet 1  . aspirin EC 81 MG tablet Take 81 mg by mouth daily.    Marland Kitchen ibuprofen (ADVIL,MOTRIN) 200 MG tablet Take 200 mg by mouth every 6 (six) hours as needed.    . latanoprost (XALATAN) 0.005 % ophthalmic solution Place 1 drop into both eyes at bedtime.    Marland Kitchen levothyroxine (SYNTHROID, LEVOTHROID) 137 MCG tablet Take 1 tablet (137 mcg total) by mouth daily before breakfast. 90 tablet 1  . LIVALO 4 MG TABS TAKE 1 TABLET (4 MG TOTAL) BY MOUTH DAILY. 90 tablet 2  . metoprolol succinate (TOPROL-XL) 25 MG 24 hr tablet TAKE 1 TABLET BY MOUTH EVERY DAY 90 tablet 1  . OVER THE COUNTER MEDICATION Super Beta Prostate    . tamsulosin (FLOMAX) 0.4 MG CAPS capsule TAKE 1 CAPSULE BY MOUTH EVERY DAY 90 capsule 1  . traMADol (ULTRAM) 50 MG tablet Take 1 tablet (50 mg total) by mouth every 12 (twelve) hours as needed. 30 tablet 0  . zolpidem (AMBIEN) 5 MG tablet TAKE 1 TABLET (5 MG TOTAL) BY MOUTH AT BEDTIME AS NEEDED FOR SLEEP. 30 tablet 0   Current Facility-Administered Medications  Medication Dose Route Frequency Provider Last Rate Last Dose  . 0.9 %  sodium chloride infusion   500 mL Intravenous Continuous Irene Shipper, MD         Past Medical History:  Diagnosis Date  . Allergy   . Arthritis   . Coronary artery disease   . High cholesterol   . Hypertension   . Hypothyroid   . Myocardial infarction [facility])     Past Surgical History:  Procedure Laterality Date  . CORONARY ARTERY BYPASS GRAFT  2014   [place]  . DENTAL RESTORATION/EXTRACTION WITH X-RAY      Social History   Socioeconomic History  . Marital status: Widowed    Spouse name: Not on file  . Number of children: 2  . Years of education: Not on file  . Highest education level: Not on file  Occupational History  . Occupation: Chartered loss adjuster  . Financial resource strain: Not on file  . Food insecurity:    Worry: Not on file    Inability: Not on file  . Transportation needs:    Medical: Not on file    Non-medical: Not on file  Tobacco Use  . Smoking status: Former Smoker    Types: Cigarettes  . Smokeless tobacco: Never Used  . Tobacco comment: 2003 quit  Substance and Sexual Activity  . Alcohol use: Yes    Alcohol/week: 0.0 oz    Comment: occasional  .  Drug use: No  . Sexual activity: Not on file  Lifestyle  . Physical activity:    Days per week: Not on file    Minutes per session: Not on file  . Stress: Not on file  Relationships  . Social connections:    Talks on phone: Not on file    Gets together: Not on file    Attends religious service: Not on file    Active member of club or organization: Not on file    Attends meetings of clubs or organizations: Not on file    Relationship status: Not on file  . Intimate partner violence:    Fear of current or ex partner: Not on file    Emotionally abused: Not on file    Physically abused: Not on file    Forced sexual activity: Not on file  Other Topics Concern  . Not on file  Social History Narrative  . Not on file    Family History  Problem Relation Age of Onset  . Breast cancer Mother   .  Diabetes Brother   . Heart disease Brother   . Colon cancer Neg Hx     ROS: Insomnia but no fevers or chills, productive cough, hemoptysis, dysphasia, odynophagia, melena, hematochezia, dysuria, hematuria, rash, seizure activity, orthopnea, PND, pedal edema, claudication. Remaining systems are negative.  Physical Exam: Well-developed well-nourished in no acute distress.  Skin is warm and dry.  HEENT is normal.  Neck is supple.  Chest is clear to auscultation with normal expansion.  Cardiovascular exam is regular rate and rhythm.  Abdominal exam nontender or distended. No masses palpated. Extremities show no edema. neuro grossly intact  ECG-sinus rhythm with occasional PVC.  Right bundle branch block.  Inferior infarct.  Personally reviewed  A/P  1 coronary artery disease-patient doing well with no chest pain.  Plan to continue medical therapy including aspirin and statin.  2 hypertension-blood pressure is elevated.  However he states typically controlled with systolic of 440 or less.  We will continue present medications and follow.  3 hyperlipidemia-continue statin.    4 history of abdominal aortic aneurysm-previous ultrasound showed ectatic aorta.  Repeat study August 2019.  Kirk Ruths, MD

## 2017-07-16 DIAGNOSIS — Z87891 Personal history of nicotine dependence: Secondary | ICD-10-CM | POA: Diagnosis not present

## 2017-07-16 DIAGNOSIS — H6123 Impacted cerumen, bilateral: Secondary | ICD-10-CM | POA: Diagnosis not present

## 2017-07-16 DIAGNOSIS — J019 Acute sinusitis, unspecified: Secondary | ICD-10-CM | POA: Diagnosis not present

## 2017-07-20 ENCOUNTER — Ambulatory Visit (INDEPENDENT_AMBULATORY_CARE_PROVIDER_SITE_OTHER): Payer: Medicare Other | Admitting: Cardiology

## 2017-07-20 ENCOUNTER — Encounter: Payer: Self-pay | Admitting: Cardiology

## 2017-07-20 VITALS — BP 166/86 | HR 63 | Ht 71.0 in | Wt 233.0 lb

## 2017-07-20 DIAGNOSIS — I714 Abdominal aortic aneurysm, without rupture, unspecified: Secondary | ICD-10-CM

## 2017-07-20 DIAGNOSIS — I25810 Atherosclerosis of coronary artery bypass graft(s) without angina pectoris: Secondary | ICD-10-CM | POA: Diagnosis not present

## 2017-07-20 DIAGNOSIS — I2510 Atherosclerotic heart disease of native coronary artery without angina pectoris: Secondary | ICD-10-CM

## 2017-07-20 DIAGNOSIS — E7800 Pure hypercholesterolemia, unspecified: Secondary | ICD-10-CM | POA: Diagnosis not present

## 2017-07-20 DIAGNOSIS — I10 Essential (primary) hypertension: Secondary | ICD-10-CM | POA: Diagnosis not present

## 2017-07-20 NOTE — Patient Instructions (Signed)
Medication Instructions:   NO CHANGE  Testing/Procedures:  Your physician has requested that you have an abdominal aorta duplex. During this test, an ultrasound is used to evaluate the aorta. Allow 30 minutes for this exam. Do not eat after midnight the day before and avoid carbonated beverages   Follow-Up:  Your physician wants you to follow-up in: ONE YEAR WITH DR CRENSHAW You will receive a reminder letter in the mail two months in advance. If you don't receive a letter, please call our office to schedule the follow-up appointment.   If you need a refill on your cardiac medications before your next appointment, please call your pharmacy.    

## 2017-07-21 ENCOUNTER — Other Ambulatory Visit: Payer: Self-pay | Admitting: Family Medicine

## 2017-07-21 DIAGNOSIS — G4700 Insomnia, unspecified: Secondary | ICD-10-CM

## 2017-07-21 NOTE — Telephone Encounter (Signed)
Last office visit on 06/09/2017 Last refill on 06/03/2017  #30 no refills.

## 2017-07-25 ENCOUNTER — Ambulatory Visit (INDEPENDENT_AMBULATORY_CARE_PROVIDER_SITE_OTHER): Payer: Medicare Other | Admitting: Psychology

## 2017-07-25 DIAGNOSIS — F5101 Primary insomnia: Secondary | ICD-10-CM

## 2017-07-25 DIAGNOSIS — F4322 Adjustment disorder with anxiety: Secondary | ICD-10-CM

## 2017-08-08 ENCOUNTER — Ambulatory Visit (INDEPENDENT_AMBULATORY_CARE_PROVIDER_SITE_OTHER): Payer: Medicare Other | Admitting: Psychology

## 2017-08-08 DIAGNOSIS — F5101 Primary insomnia: Secondary | ICD-10-CM

## 2017-08-08 DIAGNOSIS — F4322 Adjustment disorder with anxiety: Secondary | ICD-10-CM

## 2017-08-22 ENCOUNTER — Ambulatory Visit: Payer: Medicare Other | Admitting: Psychology

## 2017-10-05 ENCOUNTER — Other Ambulatory Visit: Payer: Self-pay | Admitting: Family Medicine

## 2017-10-05 NOTE — Telephone Encounter (Signed)
Last refill on 04/11/2017 Last office visit on 04/11/2017

## 2017-10-05 NOTE — Telephone Encounter (Signed)
Refilled. Schedule appt for evaluation of shoulders within the next week plz.  TY.

## 2017-10-05 NOTE — Telephone Encounter (Signed)
He is using this intermittently for back/joint pain?

## 2017-10-05 NOTE — Telephone Encounter (Signed)
Patient aware and scheduled appt on Monday 10/10/2017

## 2017-10-05 NOTE — Telephone Encounter (Signed)
Yes it is for back/shoulder pain.  Both shoulders are bothering him.

## 2017-10-10 ENCOUNTER — Encounter: Payer: Self-pay | Admitting: Family Medicine

## 2017-10-10 ENCOUNTER — Ambulatory Visit (INDEPENDENT_AMBULATORY_CARE_PROVIDER_SITE_OTHER): Payer: Medicare Other | Admitting: Family Medicine

## 2017-10-10 VITALS — BP 108/72 | HR 65 | Temp 98.4°F | Ht 71.0 in | Wt 237.1 lb

## 2017-10-10 DIAGNOSIS — M25511 Pain in right shoulder: Secondary | ICD-10-CM | POA: Diagnosis not present

## 2017-10-10 DIAGNOSIS — M1990 Unspecified osteoarthritis, unspecified site: Secondary | ICD-10-CM | POA: Insufficient documentation

## 2017-10-10 DIAGNOSIS — G8929 Other chronic pain: Secondary | ICD-10-CM

## 2017-10-10 MED ORDER — METHYLPREDNISOLONE ACETATE 40 MG/ML IJ SUSP
40.0000 mg | Freq: Once | INTRAMUSCULAR | Status: AC
  Administered 2017-10-10 (×2): 40 mg via INTRA_ARTICULAR

## 2017-10-10 MED ORDER — METHYLPREDNISOLONE ACETATE 80 MG/ML IJ SUSP
80.0000 mg | Freq: Once | INTRAMUSCULAR | Status: DC
Start: 2017-10-10 — End: 2018-05-31

## 2017-10-10 NOTE — Progress Notes (Signed)
Pre visit review using our clinic review tool, if applicable. No additional management support is needed unless otherwise documented below in the visit note. 

## 2017-10-10 NOTE — Addendum Note (Signed)
Addended by: Sharon Seller B on: 10/10/2017 08:32 AM   Modules accepted: Orders

## 2017-10-10 NOTE — Progress Notes (Signed)
Chief Complaint  Patient presents with  . Shoulder Pain    both  . Wrist Pain    right    Subjective: Patient is a 77 y.o. male here for joint pain.  Has pains in R shoulder, R wrist and b/l knees. Will intermittently take Tramadol. Has gone through a 30 tab supply in 6 mo. He did have a steroid IM shot in past that helped his pains for multiple days.    ROS: MSK: As noted in HPI  Past Medical History:  Diagnosis Date  . Allergy   . Arthritis   . Coronary artery disease   . High cholesterol   . Hypertension   . Hypothyroid   . Myocardial infarction (HCC)     Objective: BP 108/72 (BP Location: Left Arm, Patient Position: Sitting, Cuff Size: Large)   Pulse 65   Temp 98.4 F (36.9 C) (Oral)   Ht 5\' 11"  (1.803 m)   Wt 237 lb 2 oz (107.6 kg)   SpO2 95%   BMI 33.07 kg/m  General: Awake, appears stated age MSK: R shoulder- some decreased abduction and forward flexion, mild ttp at prox deltoid Heart: RRR Lungs: CTAB, no rales, wheezes or rhonchi. No accessory muscle use Psych: Age appropriate judgment and insight, normal affect and mood  Procedure Note; Shoulder bursa injection Verbal consent obtained. The area was palpated, an area was marked just caudal to the acromion process laterally, and cleaned with alcohol x1. A 27-gauge needle was used to enter the joint laterally with ease. 40 mg of Depomedrol with 2 mL of 1% lidocaine was injected. A band-aid was placed.  The patient tolerated the procedure well. There were no complications noted.  Assessment and Plan: Arthritis  Chronic right shoulder pain - Plan: PR DRAIN/INJECT LARGE JOINT/BURSA  Given age, OK to cont intermittent use of Tramadol. I am curious to see how he responds to above. Could do knees next if it goes well and likely be longer lasting than IM injection. F/u as originally scheduled.  The patient voiced understanding and agreement to the plan.  [place], DO 10/10/17  8:18  AM

## 2017-11-04 ENCOUNTER — Other Ambulatory Visit (HOSPITAL_BASED_OUTPATIENT_CLINIC_OR_DEPARTMENT_OTHER): Payer: Self-pay

## 2017-11-07 ENCOUNTER — Ambulatory Visit (HOSPITAL_BASED_OUTPATIENT_CLINIC_OR_DEPARTMENT_OTHER)
Admission: RE | Admit: 2017-11-07 | Discharge: 2017-11-07 | Disposition: A | Discharge status: Home / Self Care | Payer: Medicare Other | Source: Ambulatory Visit | Attending: Cardiology | Admitting: Cardiology

## 2017-11-07 DIAGNOSIS — E785 Hyperlipidemia, unspecified: Secondary | ICD-10-CM | POA: Insufficient documentation

## 2017-11-07 DIAGNOSIS — I714 Abdominal aortic aneurysm, without rupture, unspecified: Secondary | ICD-10-CM

## 2017-11-07 DIAGNOSIS — I10 Essential (primary) hypertension: Secondary | ICD-10-CM | POA: Diagnosis not present

## 2017-11-07 DIAGNOSIS — I2510 Atherosclerotic heart disease of native coronary artery without angina pectoris: Secondary | ICD-10-CM | POA: Diagnosis not present

## 2017-11-07 DIAGNOSIS — I252 Old myocardial infarction: Secondary | ICD-10-CM | POA: Diagnosis not present

## 2017-11-07 NOTE — Progress Notes (Signed)
VAS US ABDOMINAL AORTIC ANEURYSM  DUPLEX    Abdominal Aorta: No evidence of an abdominal aortic aneurysm was visualized.   IVC/Iliac: No evidence of thrombus in IVC and Iliac veins. No evidence of abdnormal dilitation was noted in the Inferior Vena Cava and common Iliac artery.   11/07/17  Cardell Peach RDCS, RVT

## 2017-11-11 ENCOUNTER — Other Ambulatory Visit (HOSPITAL_BASED_OUTPATIENT_CLINIC_OR_DEPARTMENT_OTHER): Payer: Self-pay

## 2017-11-27 ENCOUNTER — Other Ambulatory Visit: Payer: Self-pay | Admitting: Family Medicine

## 2017-11-27 DIAGNOSIS — I10 Essential (primary) hypertension: Secondary | ICD-10-CM

## 2017-12-09 ENCOUNTER — Ambulatory Visit: Payer: Self-pay | Admitting: Family Medicine

## 2017-12-12 ENCOUNTER — Encounter: Payer: Self-pay | Admitting: Family Medicine

## 2017-12-12 ENCOUNTER — Ambulatory Visit (INDEPENDENT_AMBULATORY_CARE_PROVIDER_SITE_OTHER): Payer: Medicare Other | Admitting: Family Medicine

## 2017-12-12 VITALS — BP 112/68 | HR 59 | Temp 97.8°F | Ht 71.0 in | Wt 233.5 lb

## 2017-12-12 DIAGNOSIS — R7303 Prediabetes: Secondary | ICD-10-CM | POA: Diagnosis not present

## 2017-12-12 DIAGNOSIS — E039 Hypothyroidism, unspecified: Secondary | ICD-10-CM | POA: Diagnosis not present

## 2017-12-12 DIAGNOSIS — G4700 Insomnia, unspecified: Secondary | ICD-10-CM | POA: Diagnosis not present

## 2017-12-12 DIAGNOSIS — Z23 Encounter for immunization: Secondary | ICD-10-CM

## 2017-12-12 DIAGNOSIS — I10 Essential (primary) hypertension: Secondary | ICD-10-CM

## 2017-12-12 DIAGNOSIS — Z125 Encounter for screening for malignant neoplasm of prostate: Secondary | ICD-10-CM

## 2017-12-12 DIAGNOSIS — E785 Hyperlipidemia, unspecified: Secondary | ICD-10-CM | POA: Diagnosis not present

## 2017-12-12 DIAGNOSIS — N400 Enlarged prostate without lower urinary tract symptoms: Secondary | ICD-10-CM | POA: Diagnosis not present

## 2017-12-12 LAB — LIPID PANEL
CHOL/HDL RATIO: 3
Cholesterol: 149 mg/dL (ref 0–200)
HDL: 43.7 mg/dL (ref >39.00)
LDL Cholesterol: 70 mg/dL (ref 0–99)
NONHDL: 104.87
Triglycerides: 175 mg/dL — ABNORMAL HIGH (ref 0.0–149.0)
VLDL: 35 mg/dL (ref 0.0–40.0)

## 2017-12-12 LAB — COMPREHENSIVE METABOLIC PANEL
ALT: 20 U/L (ref 0–53)
AST: 16 U/L (ref 0–37)
Albumin: 4.1 g/dL (ref 3.5–5.2)
Alkaline Phosphatase: 47 U/L (ref 39–117)
BILIRUBIN TOTAL: 0.5 mg/dL (ref 0.2–1.2)
BUN: 22 mg/dL (ref 6–23)
CO2: 28 meq/L (ref 19–32)
Calcium: 9.6 mg/dL (ref 8.4–10.5)
Chloride: 103 mEq/L (ref 96–112)
Creatinine, Ser: 1.14 mg/dL (ref 0.40–1.50)
GFR: 66.59 mL/min (ref >60.00)
GLUCOSE: 84 mg/dL (ref 70–99)
Potassium: 4.4 mEq/L (ref 3.5–5.1)
SODIUM: 139 meq/L (ref 135–145)
Total Protein: 7.2 g/dL (ref 6.0–8.3)

## 2017-12-12 LAB — HEMOGLOBIN A1C: HEMOGLOBIN A1C: 5.9 % (ref 4.6–6.5)

## 2017-12-12 LAB — PSA, MEDICARE: PSA: 0.45 ng/ml (ref 0.10–4.00)

## 2017-12-12 LAB — T4, FREE: FREE T4: 0.94 ng/dL (ref 0.60–1.60)

## 2017-12-12 LAB — TSH: TSH: 8.76 u[IU]/mL — ABNORMAL HIGH (ref 0.35–4.50)

## 2017-12-12 MED ORDER — AMLODIPINE BESYLATE 10 MG PO TABS: 10.0000 mg | ORAL_TABLET | Freq: Every day | ORAL | 1 refills | Status: DC

## 2017-12-12 NOTE — Progress Notes (Signed)
Chief Complaint  Patient presents with  . Follow-up    Subjective: Patient is a 77 y.o. male here for med ck.  Insomnia Hx of insomnia on Ambien 5 mg qhs prn. Tried to wean down, failed CBT, trazodone, Belsomra, low dose doxepin, Ramelteon, otc melatonin, and Remeron. Doing relatively well, no AE's. Does not wish to see a sleep specialist at this time.   Hypothyroidism Patient presents for follow-up of hypothyroidism.  Reports compliance with medication- Synthroid 137 mcg/d. Current symptoms include: denies fatigue, weight changes, heat/cold intolerance, bowel/skin changes or CVS symptoms No AE's from medication.   BPH HX of BPH on Flomax 0.4 mg/d. Tolerating well. No AE's. Content with s/s's at this time. Wakes up 1-2 times at night.   Hypertension Patient presents for hypertension follow up. He does not routinely monitor home blood pressures. He is compliant with medications- Metoprolol XL 25 mg/d, Norvasc 10 mg/d. Patient has these side effects of medication: none He is usually adhering to a healthy diet overall. Exercise: some walking, active at work  Prediabetes Hx of prediabetes, not on any meds. Diet is fair, does stay active.  CAD/dyslipidemia Follows with cardiology, hx of MI. Currently on Livalo, no AE's with this. Diet/exercise as noted above. Also on ASA, BB.  Hx of R shoulder pain and OA, did well with injection in R shoulder 2 mo ago. Starting to come back but not nearly as bad. OK on holding off on other injections.   ROS: 10 pt ROS neg unless otherwise noted in HPI  Past Medical History:  Diagnosis Date  . Allergy   . Arthritis   . Coronary artery disease   . High cholesterol   . Hypertension   . Hypothyroid   . Myocardial infarction (HCC)     Objective: BP 112/68 (BP Location: Left Arm, Patient Position: Sitting, Cuff Size: Large)   Pulse (!) 59   Temp 97.8 F (36.6 C) (Oral)   Ht 5\' 11"  (1.803 m)   Wt 233 lb 8 oz (105.9 kg)   SpO2 96%   BMI  32.57 kg/m  General: Awake, appears stated age HEENT: MMM, EOMi Heart: RRR, no LE edema, no bruits Lungs: CTAB, no rales, wheezes or rhonchi. No accessory muscle use Psych: Age appropriate judgment and insight, normal affect and mood  Assessment and Plan: Essential hypertension, benign - Plan: amLODipine (NORVASC) 10 MG tablet, Comprehensive metabolic panel  Need for influenza vaccination - Plan: Flu vaccine HIGH DOSE PF (Fluzone High dose)  Dyslipidemia - Plan: Lipid panel  Hypothyroidism, unspecified type - Plan: TSH, T4, free  Insomnia, unspecified type  Prediabetes - Plan: Hemoglobin A1c  Benign prostatic hyperplasia, unspecified whether lower urinary tract symptoms present  Screening for prostate cancer - Plan: PSA, Medicare ( [place] only)  Orders as above. Doing well overall, insomnia not controlled, offered referral but he declined at this time. Cont BP meds. Cont Livalo. Cont Flomax. Counseled on diet and exercise. Counseled on risks and benefits of PSA screening, pt agreed to undergo testing. This is his last screening given his age.  F/u in 6 mo or sooner for inj. The patient voiced understanding and agreement to the plan.  [place], DO 12/12/17  7:36 AM

## 2017-12-12 NOTE — Addendum Note (Signed)
Addended by: Sharon Seller B on: 12/12/2017 07:41 AM   Modules accepted: Orders

## 2017-12-12 NOTE — Patient Instructions (Signed)
Give [place] 2-3 business days to get the results of your labs back.   Keep the diet clean and stay active.  Let us know if you need refills.  We can give you another shot in 1 month.  Let us know if you need anything.

## 2017-12-12 NOTE — Progress Notes (Signed)
Pre visit review using our clinic review tool, if applicable. No additional management support is needed unless otherwise documented below in the visit note. 

## 2017-12-31 ENCOUNTER — Other Ambulatory Visit: Payer: Self-pay | Admitting: Family Medicine

## 2018-01-23 ENCOUNTER — Other Ambulatory Visit: Payer: Self-pay | Admitting: Family Medicine

## 2018-01-23 DIAGNOSIS — G4700 Insomnia, unspecified: Secondary | ICD-10-CM

## 2018-01-23 NOTE — Telephone Encounter (Signed)
Last office visit on 12/12/2017 Last refill for zolpidem 07/21/2017  #30 with 5 refills Last refills for tramadol 10/05/2017  #20 no refills UDS done on 04/11/2017

## 2018-02-16 ENCOUNTER — Ambulatory Visit (INDEPENDENT_AMBULATORY_CARE_PROVIDER_SITE_OTHER): Payer: Medicare Other | Admitting: Family Medicine

## 2018-02-16 ENCOUNTER — Encounter: Payer: Self-pay | Admitting: Family Medicine

## 2018-02-16 VITALS — BP 124/70 | HR 65 | Temp 98.0°F | Resp 16 | Ht 71.0 in | Wt 233.0 lb

## 2018-02-16 DIAGNOSIS — B9789 Other viral agents as the cause of diseases classified elsewhere: Secondary | ICD-10-CM | POA: Diagnosis not present

## 2018-02-16 DIAGNOSIS — J069 Acute upper respiratory infection, unspecified: Secondary | ICD-10-CM | POA: Diagnosis not present

## 2018-02-16 NOTE — Patient Instructions (Signed)
Continue to push fluids, practice good hand hygiene, and cover your mouth if you cough.  If you start having fevers, shaking or shortness of breath, seek immediate care.  For symptoms, consider using Vick's VapoRub on chest or under nose, air humidifier, Benadryl at night, and elevating the head of the bed. Tylenol and ibuprofen for aches and pains you may be experiencing.   Let us know if you need anything.  

## 2018-02-16 NOTE — Progress Notes (Signed)
Chief Complaint  Patient presents with  . URI    sore throat/neck, coughing, congestion,"feels like its in his chest" , possible low grade fever, worse with cold weather    Jeffrey Rhodes here for URI complaints.  Duration: 5 days  Associated symptoms: subjective fever, R ear fullness, sinus congestion, sore throat, itchy/watery eyes, chest tightness and cough Denies: sinus pain, ear pain, ear drainage, wheezing and shortness of breath Treatment to date: humidifier, OTC meds Sick contacts: No  ROS:  Const: Denies current fevers HEENT: As noted in HPI Lungs: No SOB  Past Medical History:  Diagnosis Date  . Allergy   . Arthritis   . Coronary artery disease   . High cholesterol   . Hypertension   . Hypothyroid   . Myocardial infarction (HCC)     BP 124/70 (BP Location: Right Arm, Patient Position: Sitting, Cuff Size: Normal)   Pulse 65   Temp 98 F (36.7 C) (Oral)   Resp 16   Ht 5\' 11"  (1.803 m)   Wt 233 lb (105.7 kg)   SpO2 96%   BMI 32.50 kg/m  General: Awake, alert, appears stated age HEENT: AT, [place], ears patent b/l and TM's neg, nares patent w/o discharge, pharynx pink and without exudates, no ttp over max sinuses, MMM Neck: No masses or asymmetry Heart: RRR Lungs: CTAB, no accessory muscle use Psych: Age appropriate judgment and insight, normal mood and affect  Viral URI with cough  Depo shot today. Seems that he is turning corner. INCS for future prevention of ETD. Continue to push fluids, practice good hand hygiene, cover mouth when coughing. F/u prn. If starting to experience fevers, shaking, or shortness of breath, seek immediate care. Pt voiced understanding and agreement to the plan.  [place], DO 02/16/18 8:56 AM

## 2018-02-28 DIAGNOSIS — L821 Other seborrheic keratosis: Secondary | ICD-10-CM | POA: Diagnosis not present

## 2018-02-28 DIAGNOSIS — L570 Actinic keratosis: Secondary | ICD-10-CM | POA: Diagnosis not present

## 2018-03-11 DIAGNOSIS — H40029 Open angle with borderline findings, high risk, unspecified eye: Secondary | ICD-10-CM | POA: Diagnosis not present

## 2018-03-12 ENCOUNTER — Other Ambulatory Visit: Payer: Self-pay | Admitting: Cardiology

## 2018-03-12 DIAGNOSIS — I25810 Atherosclerosis of coronary artery bypass graft(s) without angina pectoris: Secondary | ICD-10-CM

## 2018-03-31 ENCOUNTER — Other Ambulatory Visit: Payer: Self-pay | Admitting: Family Medicine

## 2018-04-06 DIAGNOSIS — J069 Acute upper respiratory infection, unspecified: Secondary | ICD-10-CM | POA: Diagnosis not present

## 2018-04-06 DIAGNOSIS — J0141 Acute recurrent pansinusitis: Secondary | ICD-10-CM | POA: Diagnosis not present

## 2018-04-06 DIAGNOSIS — J060 Acute laryngopharyngitis: Secondary | ICD-10-CM | POA: Diagnosis not present

## 2018-05-18 ENCOUNTER — Other Ambulatory Visit: Payer: Self-pay | Admitting: Family Medicine

## 2018-05-18 DIAGNOSIS — I10 Essential (primary) hypertension: Secondary | ICD-10-CM

## 2018-05-31 ENCOUNTER — Ambulatory Visit (INDEPENDENT_AMBULATORY_CARE_PROVIDER_SITE_OTHER): Payer: Medicare Other | Admitting: Family Medicine

## 2018-05-31 ENCOUNTER — Other Ambulatory Visit: Payer: Self-pay

## 2018-05-31 ENCOUNTER — Encounter: Payer: Self-pay | Admitting: Family Medicine

## 2018-05-31 VITALS — Wt 225.0 lb

## 2018-05-31 DIAGNOSIS — M25512 Pain in left shoulder: Secondary | ICD-10-CM

## 2018-05-31 DIAGNOSIS — G8929 Other chronic pain: Secondary | ICD-10-CM | POA: Diagnosis not present

## 2018-05-31 DIAGNOSIS — M25511 Pain in right shoulder: Secondary | ICD-10-CM

## 2018-05-31 MED ORDER — MELOXICAM 15 MG PO TABS: ORAL_TABLET | ORAL | 0 refills | Status: DC

## 2018-05-31 MED ORDER — TRAMADOL HCL 50 MG PO TABS: ORAL_TABLET | ORAL | 0 refills | Status: DC

## 2018-05-31 NOTE — Progress Notes (Signed)
Virtual Visit via Video Note  I connected with Jeffrey Rhodes on 05/31/18 at 10:30 AM EDT by a video enabled telemedicine application and verified that I am speaking with the correct person using two identifiers.   I discussed the limitations of evaluation and management by telemedicine and the availability of in person appointments. The patient expressed understanding and agreed to proceed.  History of Present Illness: Pt w hx of OA in shoulders, has responded to injections, most recent >3 mo ago. Has been using tramadol which helps a bit, still painful. No other OTC meds. Got worse around 1-2 weeks ago. No inj or change in activity. Associated decreased ROM 2/2 pain. Has been doing stretches and exercises for shoulders over past couple days.    Observations/Objective: No conversational dyspnea Age appropriate judgment and insight Nml affect and mood  Assessment and Plan: Chronic pain of both shoulders - Plan: traMADol (ULTRAM) 50 MG tablet, meloxicam (MOBIC) 15 MG tablet  NSAID prn. Tramadol for breakthrough pain. Cont stretches/exercises, heat, ice, activity as tolerated. Steroid injections tomorrow.   Follow Up Instructions: Tomorrow for shoulder injections   I discussed the assessment and treatment plan with the patient. The patient was provided an opportunity to ask questions and all were answered. The patient agreed with the plan and demonstrated an understanding of the instructions.   The patient was advised to call back or seek an in-person evaluation if the symptoms worsen or if the condition fails to improve as anticipated.   [place], DO

## 2018-06-01 ENCOUNTER — Other Ambulatory Visit: Payer: Self-pay

## 2018-06-01 ENCOUNTER — Ambulatory Visit (INDEPENDENT_AMBULATORY_CARE_PROVIDER_SITE_OTHER): Payer: Medicare Other | Admitting: Family Medicine

## 2018-06-01 ENCOUNTER — Encounter: Payer: Self-pay | Admitting: Family Medicine

## 2018-06-01 DIAGNOSIS — G8929 Other chronic pain: Secondary | ICD-10-CM | POA: Diagnosis not present

## 2018-06-01 DIAGNOSIS — M25511 Pain in right shoulder: Secondary | ICD-10-CM

## 2018-06-01 DIAGNOSIS — M25512 Pain in left shoulder: Secondary | ICD-10-CM

## 2018-06-01 MED ORDER — TRIAMCINOLONE ACETONIDE 40 MG/ML IJ SUSP
40.0000 mg | Freq: Once | INTRAMUSCULAR | Status: AC
  Administered 2018-06-01: 09:00:00 40 mg via INTRA_ARTICULAR

## 2018-06-01 NOTE — Progress Notes (Signed)
Chief Complaint  Patient presents with  . Injections    shoulders   Seen yesterday via E visit, here to day for b/l shoulder injections.  Gen: Awake, alert Psych: Age approp judgment and insight  Procedure Note; Shoulder bursa injection, bilateral Verbal consent obtained. The area was palpated, an area was marked just caudal to the acromion process laterally, and cleaned with Betadine x1. A 27-gauge needle was used to enter the joint laterally with ease. 40 mg of Kenalog with 2 mL of 1% lidocaine was injected. Bandaid placed.  This process was repeated on the contralateral side. The patient tolerated the procedure well. There were no complications noted.  Chronic pain of both shoulders - Plan: PR DRAIN/INJECT LARGE JOINT/BURSA  Cont stretches/exercises, NSAIDs prn, ice.  F/u prn. Pt voiced understanding and agreement to the plan.  Jeffrey Rhodes Rory Xiang 8:40 AM 06/01/18

## 2018-06-06 ENCOUNTER — Other Ambulatory Visit: Payer: Self-pay | Admitting: Family Medicine

## 2018-06-06 DIAGNOSIS — I10 Essential (primary) hypertension: Secondary | ICD-10-CM

## 2018-06-12 ENCOUNTER — Ambulatory Visit (INDEPENDENT_AMBULATORY_CARE_PROVIDER_SITE_OTHER): Payer: Medicare Other | Admitting: Family Medicine

## 2018-06-12 ENCOUNTER — Encounter: Payer: Self-pay | Admitting: Family Medicine

## 2018-06-12 ENCOUNTER — Other Ambulatory Visit: Payer: Self-pay

## 2018-06-12 DIAGNOSIS — H9201 Otalgia, right ear: Secondary | ICD-10-CM

## 2018-06-12 MED ORDER — PREDNISONE 20 MG PO TABS: 40.0000 mg | ORAL_TABLET | Freq: Every day | ORAL | 0 refills | Status: AC

## 2018-06-12 MED ORDER — AMOXICILLIN-POT CLAVULANATE 875-125 MG PO TABS: 1.0000 | ORAL_TABLET | Freq: Two times a day (BID) | ORAL | 0 refills | Status: AC

## 2018-06-12 NOTE — Progress Notes (Signed)
Chief Complaint  Patient presents with  . Ear Problem    Subjective: Patient is a 78 y.o. male here for R ear pain. Due to outbreak, we are interacting via web portal for an electronic face-to-face visit. I verified patient's ID using 2 identifiers.   2 d, no inj or inciting event. Ear feels full, painful on inside and radiating behind ear. No drainage, redness, fevers. +mild sore/itchy throat that preceded this. Advil was not particularly helpful.   ROS: Ears: As noted in HPI  Past Medical History:  Diagnosis Date  . Allergy   . Arthritis   . Coronary artery disease   . High cholesterol   . Hypertension   . Hypothyroid   . Myocardial infarction (HCC)     Objective: No conversational dyspnea Age appropriate judgment and insight Nml affect and mood  Assessment and Plan: Right ear pain - Plan: predniSONE (DELTASONE) 20 MG tablet, amoxicillin-clavulanate (AUGMENTIN) 875-125 MG tablet  Orders as above. Pred burst to tx likely ETD. If no improvement, will have augmentin for potential ear canal cellulitis vs AOM.  F/u prn.  The patient voiced understanding and agreement to the plan.  [place], DO 06/12/18  11:42 AM

## 2018-06-19 ENCOUNTER — Encounter: Payer: Self-pay | Admitting: Family Medicine

## 2018-06-19 ENCOUNTER — Other Ambulatory Visit: Payer: Self-pay

## 2018-06-19 ENCOUNTER — Ambulatory Visit (INDEPENDENT_AMBULATORY_CARE_PROVIDER_SITE_OTHER): Payer: Medicare Other | Admitting: Family Medicine

## 2018-06-19 DIAGNOSIS — M25511 Pain in right shoulder: Secondary | ICD-10-CM

## 2018-06-19 DIAGNOSIS — E785 Hyperlipidemia, unspecified: Secondary | ICD-10-CM

## 2018-06-19 DIAGNOSIS — M25512 Pain in left shoulder: Secondary | ICD-10-CM | POA: Diagnosis not present

## 2018-06-19 DIAGNOSIS — G8929 Other chronic pain: Secondary | ICD-10-CM

## 2018-06-19 DIAGNOSIS — M25569 Pain in unspecified knee: Secondary | ICD-10-CM

## 2018-06-19 DIAGNOSIS — I10 Essential (primary) hypertension: Secondary | ICD-10-CM | POA: Diagnosis not present

## 2018-06-19 DIAGNOSIS — Z8669 Personal history of other diseases of the nervous system and sense organs: Secondary | ICD-10-CM | POA: Diagnosis not present

## 2018-06-19 DIAGNOSIS — N400 Enlarged prostate without lower urinary tract symptoms: Secondary | ICD-10-CM

## 2018-06-19 DIAGNOSIS — E039 Hypothyroidism, unspecified: Secondary | ICD-10-CM | POA: Diagnosis not present

## 2018-06-19 DIAGNOSIS — H9201 Otalgia, right ear: Secondary | ICD-10-CM

## 2018-06-19 NOTE — Patient Instructions (Signed)
Keep up the good work.   OK to take Tylenol 1000 mg (2 extra strength tabs) or 975 mg (3 regular strength tabs) every 6 hours as needed.  If your shoulder pain returns to where you cannot continue on current plan, let me know and we will get you in with a specialist. If range of motion continues to be an issue, we will get you in with physical therapy.  For the knees, do what you can tolerate of the following. If no improvement over the next 3-4 weeks, let me know and we can try injections. If you'd like to see physical therapy at any time for the knees, we can set this up as well.  Let us know if you need anything.  Knee Exercises It is normal to feel mild stretching, pulling, tightness, or discomfort as you do these exercises, but you should stop right away if you feel sudden pain or your pain gets worse. STRETCHING AND RANGE OF MOTION EXERCISES  These exercises warm up your muscles and joints and improve the movement and flexibility of your knee. These exercises also help to relieve pain, numbness, and tingling. Exercise A: Knee Extension, Prone  1. Lie on your abdomen on a bed. 2. Place your left / right knee just beyond the edge of the surface so your knee is not on the bed. You can put a towel under your left / right thigh just above your knee for comfort. 3. Relax your leg muscles and allow gravity to straighten your knee. You should feel a stretch behind your left / right knee. 4. Hold this position for 30 seconds. 5. Scoot up so your knee is supported between repetitions. Repeat 2 times. Complete this stretch 3 times per week. Exercise B: Knee Flexion, Active    1. Lie on your back with both knees straight. If this causes back discomfort, bend your left / right knee so your foot is flat on the floor. 2. Slowly slide your left / right heel back toward your buttocks until you feel a gentle stretch in the front of your knee or thigh. 3. Hold this position for 30 seconds. 4. Slowly  slide your left / right heel back to the starting position. Repeat 2 times. Complete this exercise 3 times per week. Exercise C: Quadriceps, Prone    1. Lie on your abdomen on a firm surface, such as a bed or padded floor. 2. Bend your left / right knee and hold your ankle. If you cannot reach your ankle or pant leg, loop a belt around your foot and grab the belt instead. 3. Gently pull your heel toward your buttocks. Your knee should not slide out to the side. You should feel a stretch in the front of your thigh and knee. 4. Hold this position for 30 seconds. Repeat 2 times. Complete this stretch 3 times per week. Exercise D: Hamstring, Supine  1. Lie on your back. 2. Loop a belt or towel over the ball of your left / right foot. The ball of your foot is on the walking surface, right under your toes. 3. Straighten your left / right knee and slowly pull on the belt to raise your leg until you feel a gentle stretch behind your knee. ? Do not let your left / right knee bend while you do this. ? Keep your other leg flat on the floor. 4. Hold this position for 30 seconds. Repeat 2 times. Complete this stretch 3 times per week. STRENGTHENING  EXERCISES  These exercises build strength and endurance in your knee. Endurance is the ability to use your muscles for a long time, even after they get tired. Exercise E: Quadriceps, Isometric    1. Lie on your back with your left / right leg extended and your other knee bent. Put a rolled towel or small pillow under your knee if told by your health care provider. 2. Slowly tense the muscles in the front of your left / right thigh. You should see your kneecap slide up toward your hip or see increased dimpling just above the knee. This motion will push the back of the knee toward the floor. 3. For 3 seconds, keep the muscle as tight as you can without increasing your pain. 4. Relax the muscles slowly and completely. Repeat for 10 total reps Repeat 2 ti mes.  Complete this exercise 3 times per week. Exercise F: Straight Leg Raises - Quadriceps  1. Lie on your back with your left / right leg extended and your other knee bent. 2. Tense the muscles in the front of your left / right thigh. You should see your kneecap slide up or see increased dimpling just above the knee. Your thigh may even shake a bit. 3. Keep these muscles tight as you raise your leg 4-6 inches (10-15 cm) off the floor. Do not let your knee bend. 4. Hold this position for 3 seconds. 5. Keep these muscles tense as you lower your leg. 6. Relax your muscles slowly and completely after each repetition. 10 total reps. Repeat 2 times. Complete this exercise 3 times per week.  Exercise G: Hamstring Curls    If told by your health care provider, do this exercise while wearing ankle weights. Begin with 5 lb weights (optional). Then increase the weight by 1 lb (0.5 kg) increments. Do not wear ankle weights that are more than 20 lbs to start with. 1. Lie on your abdomen with your legs straight. 2. Bend your left / right knee as far as you can without feeling pain. Keep your hips flat against the floor. 3. Hold this position for 3 seconds. 4. Slowly lower your leg to the starting position. Repeat for 10 reps.  Repeat 2 times. Complete this exercise 3 times per week. Exercise H: Squats (Quadriceps)  1. Stand in front of a table, with your feet and knees pointing straight ahead. You may rest your hands on the table for balance but not for support. 2. Slowly bend your knees and lower your hips like you are going to sit in a chair. ? Keep your weight over your heels, not over your toes. ? Keep your lower legs upright so they are parallel with the table legs. ? Do not let your hips go lower than your knees. ? Do not bend lower than told by your health care provider. ? If your knee pain increases, do not bend as low. 3. Hold the squat position for 1 second. 4. Slowly push with your legs to  return to standing. Do not use your hands to pull yourself to standing. Repeat 2 times. Complete this exercise 3 times per week. Exercise I: Wall Slides (Quadriceps)    1. Lean your back against a smooth wall or door while you walk your feet out 18-24 inches (46-61 cm) from it. 2. Place your feet hip-width apart. 3. Slowly slide down the wall or door until your knees Repeat 2 times. Complete this exercise every other day. 4. Exercise K: Straight  Leg Raises - Hip Abductors  1. Lie on your side with your left / right leg in the top position. Lie so your head, shoulder, knee, and hip line up. You may bend your bottom knee to help you keep your balance. 2. Roll your hips slightly forward so your hips are stacked directly over each other and your left / right knee is facing forward. 3. Leading with your heel, lift your top leg 4-6 inches (10-15 cm). You should feel the muscles in your outer hip lifting. ? Do not let your foot drift forward. ? Do not let your knee roll toward the ceiling. 4. Hold this position for 3 seconds. 5. Slowly return your leg to the starting position. 6. Let your muscles relax completely after each repetition. 10 total reps. Repeat 2 times. Complete this exercise 3 times per week. Exercise J: Straight Leg Raises - Hip Extensors  1. Lie on your abdomen on a firm surface. You can put a pillow under your hips if that is more comfortable. 2. Tense the muscles in your buttocks and lift your left / right leg about 4-6 inches (10-15 cm). Keep your knee straight as you lift your leg. 3. Hold this position for 3 seconds. 4. Slowly lower your leg to the starting position. 5. Let your leg relax completely after each repetition. Repeat 2 times. Complete this exercise 3 times per week. Document Released: 12/23/2004 Document Revised: 11/03/2015 Document Reviewed: 12/15/2014 Elsevier Interactive Patient Education  2017 [employer].

## 2018-06-19 NOTE — Progress Notes (Signed)
CC: 6 mo check  Subjective Jeffrey Rhodes is a 78 y.o. male who presents for hypertension follow up. Due to outbreak, we are interacting via web portal for an electronic face-to-face visit. I verified patient's ID using 2 identifiers.   He does not monitor home blood pressures. He is compliant with medications- Norvasc 10 mg/d, Toprol XL 50 mg/d. Patient has these side effects of medication: none He is adhering to a healthy diet overall. Current exercise: Active around house  Hypothyroidism Patient presents for follow-up of hypothyroidism.  Reports compliance with medication- levothyroxine 137 mcg/d. Current symptoms include: denies fatigue, weight changes, heat/cold intolerance, bowel/skin changes or CVS symptoms He believes his dose should be unchanged  Hyperlipidemia Patient presents for dyslipidemia follow up. Currently being treated with Livalo 4 mg/d and compliance with treatment thus far has been good. He denies myalgias. He is adhering to a healthy. Exercise: active in house The patient is known to have coexisting coronary artery disease.  BPH Hx of BPH on Flomax. Doing well on medication, no urinary issues. Reports compliance.   R ear pain has resolved.  B/l shoulder pain has improved around 75%. Still having some ROM restrictions, worse on R. He is pleased so far with his progress and does not wish to pursue further treatment at this time.  After being more active around house, he has b/l knee pain. Some swelling, no redness or fevers. Has not tried anything thus far.    Past Medical History:  Diagnosis Date  . Allergy   . Arthritis   . Coronary artery disease   . High cholesterol   . Hypertension   . Hypothyroid   . Myocardial infarction [facility])    Family History  Problem Relation Age of Onset  . Breast cancer Mother   . Diabetes Brother   . Heart disease Brother   . Colon cancer Neg Hx     Past Surgical History:  Procedure Laterality Date  . CORONARY  ARTERY BYPASS GRAFT  2014   [place]  . DENTAL RESTORATION/EXTRACTION WITH X-RAY     Allergies  Allergen Reactions  . Bee Venom Other (See Comments)    unknown unknown   . Lisinopril Other (See Comments)    cough Other reaction(s): COUGH Other reaction(s): COUGH cough Other reaction(s): COUGH cough Other reaction(s): COUGH Other reaction(s): COUGH cough Other reaction(s): COUGH Other reaction(s): COUGH cough   . Losartan Cough    Review of Systems Cardiovascular: no chest pain Respiratory:  no shortness of breath Eyes: No vision changes Const: No fevers Endo: No wt loss GI: No bowel changes Skin: No rashes GU: No urinary complaints Ears: No ear pain Psych: No insomnia MSK: +R shoulder pain, +knee pain  Exam No conversational dyspnea Age appropriate judgment and insight Nml affect and mood  Essential hypertension, benign  Dyslipidemia  Hypothyroidism, unspecified type  Benign prostatic hyperplasia, unspecified whether lower urinary tract symptoms present  Right ear pain  Chronic pain of both shoulders  Knee pain, unspecified chronicity, unspecified laterality  1- Cont CCB/BB. No need to ck BP unless desired. Counseled on diet and exercise. 2- Cont Livalo. Exercise as above. 3- Cont levothyroxine 137 mcg/d 4- Cont Flomax 5- Resolved 6- hold on therapy for now. If he has worsening pain before next injection due, will refer to ortho. If he has ROM issues that aren't improving with HEP, will refer to PT. 7- Will mail stretches/exercises for knee. If no improvement in the next 3-4 weeks, will inject. Standing offer  for PT.  F/u in 6 mo or prn. The patient voiced understanding and agreement to the plan.  [place], DO 06/19/18  7:19 AM

## 2018-06-23 ENCOUNTER — Other Ambulatory Visit: Payer: Self-pay | Admitting: Family Medicine

## 2018-06-23 DIAGNOSIS — M25512 Pain in left shoulder: Principal | ICD-10-CM

## 2018-06-23 DIAGNOSIS — M25511 Pain in right shoulder: Principal | ICD-10-CM

## 2018-06-23 DIAGNOSIS — G8929 Other chronic pain: Secondary | ICD-10-CM

## 2018-06-25 ENCOUNTER — Other Ambulatory Visit: Payer: Self-pay | Admitting: Family Medicine

## 2018-07-16 ENCOUNTER — Other Ambulatory Visit: Payer: Self-pay | Admitting: Family Medicine

## 2018-07-16 DIAGNOSIS — G8929 Other chronic pain: Secondary | ICD-10-CM

## 2018-07-16 DIAGNOSIS — M25511 Pain in right shoulder: Secondary | ICD-10-CM

## 2018-07-18 ENCOUNTER — Other Ambulatory Visit: Payer: Self-pay | Admitting: Family Medicine

## 2018-07-18 DIAGNOSIS — G4700 Insomnia, unspecified: Secondary | ICD-10-CM

## 2018-07-18 NOTE — Telephone Encounter (Signed)
Last OV 06/19/2018 Last refill 01/2018 for #30 with 5 refills.

## 2018-08-10 ENCOUNTER — Other Ambulatory Visit: Payer: Self-pay | Admitting: Family Medicine

## 2018-08-10 DIAGNOSIS — G8929 Other chronic pain: Secondary | ICD-10-CM

## 2018-08-10 DIAGNOSIS — M25511 Pain in right shoulder: Secondary | ICD-10-CM

## 2018-08-14 DIAGNOSIS — H9203 Otalgia, bilateral: Secondary | ICD-10-CM | POA: Diagnosis not present

## 2018-08-14 DIAGNOSIS — J029 Acute pharyngitis, unspecified: Secondary | ICD-10-CM | POA: Diagnosis not present

## 2018-08-14 DIAGNOSIS — H6123 Impacted cerumen, bilateral: Secondary | ICD-10-CM | POA: Diagnosis not present

## 2018-08-16 ENCOUNTER — Telehealth: Payer: Self-pay | Admitting: *Deleted

## 2018-08-16 NOTE — Telephone Encounter (Signed)
Mr. Guidotti, will call back to schedule.

## 2018-08-21 NOTE — Progress Notes (Signed)
HPI: Follow-up coronary artery disease. [place] 2017 he was admitted with chest pain and ruled out. His symptoms were felt to be atypical. Nuclear study showed an ejection fraction of 58%. No infarct or ischemia.Abd ultrasound 9/19 showed no AAA.Since last seenthe patient has dyspnea with more extreme activities but not with routine activities. It is relieved with rest. It is not associated with chest pain. There is no orthopnea, PND or pedal edema. There is no syncope or palpitations. There is no exertional chest pain.   Current Outpatient Medications  Medication Sig Dispense Refill  . acetaminophen (TYLENOL) 500 MG tablet Take 500 mg by mouth every 8 (eight) hours as needed for mild pain.     Marland Kitchen amLODipine (NORVASC) 10 MG tablet TAKE 1 TABLET BY MOUTH EVERY DAY 90 tablet 1  . aspirin EC 81 MG tablet Take 81 mg by mouth daily.    Marland Kitchen latanoprost (XALATAN) 0.005 % ophthalmic solution Place 1 drop into both eyes at bedtime.    Marland Kitchen LIVALO 4 MG TABS TAKE 1 TABLET (4 MG TOTAL) BY MOUTH DAILY. 90 tablet 1  . meloxicam (MOBIC) 15 MG tablet TAKE 1 TAB DAILY WHEN ARTHRITIS FLARES. 30 tablet 0  . metoprolol succinate (TOPROL-XL) 25 MG 24 hr tablet TAKE 1 TABLET BY MOUTH EVERY DAY 90 tablet 1  . OVER THE COUNTER MEDICATION Super Beta Prostate    . SYNTHROID 137 MCG tablet TAKE 1 TABLET (137 MCG TOTAL) BY MOUTH DAILY BEFORE BREAKFAST. 90 tablet 1  . tamsulosin (FLOMAX) 0.4 MG CAPS capsule TAKE 1 CAPSULE BY MOUTH EVERY DAY 90 capsule 1  . traMADol (ULTRAM) 50 MG tablet TAKE 1 TABLET BY MOUTH EVERY 6-8 HOURS AS NEEDED FOR SEVERE PAIN. 30 tablet 0  . zolpidem (AMBIEN) 5 MG tablet TAKE 1 TABLET (5 MG TOTAL) BY MOUTH AT BEDTIME AS NEEDED FOR SLEEP. 30 tablet 5   No current facility-administered medications for this visit.      Past Medical History:  Diagnosis Date  . Allergy   . Arthritis   . Coronary artery disease   . High  cholesterol   . Hypertension   . Hypothyroid   . Myocardial infarction [facility])     Past Surgical History:  Procedure Laterality Date  . CORONARY ARTERY BYPASS GRAFT  2014   [place]  . DENTAL RESTORATION/EXTRACTION WITH X-RAY      Social History   Socioeconomic History  . Marital status: Widowed    Spouse name: Not on file  . Number of children: 2  . Years of education: Not on file  . Highest education level: Not on file  Occupational History  . Occupation: Chartered loss adjuster  . Financial resource strain: Not on file  . Food insecurity    Worry: Not on file    Inability: Not on file  . Transportation needs    Medical: Not on file    Non-medical: Not on file  Tobacco Use  . Smoking status: Former Smoker    Types: Cigarettes  . Smokeless tobacco: Never Used  . Tobacco comment: 2003 quit  Substance and Sexual Activity  . Alcohol use: Yes    Alcohol/week: 0.0 standard drinks    Comment: occasional  . Drug use: No  . Sexual activity: Not on file  Lifestyle  . Physical activity    Days per week: Not on file    Minutes per session:  Not on file  . Stress: Not on file  Relationships  . Social Herbalist on phone: Not on file    Gets together: Not on file    Attends religious service: Not on file    Active member of club or organization: Not on file    Attends meetings of clubs or organizations: Not on file    Relationship status: Not on file  . Intimate partner violence    Fear of current or ex partner: Not on file    Emotionally abused: Not on file    Physically abused: Not on file    Forced sexual activity: Not on file  Other Topics Concern  . Not on file  Social History Narrative  . Not on file    Family History  Problem Relation Age of Onset  . Breast cancer Mother   . Diabetes Brother   . Heart disease Brother   . Colon cancer Neg Hx     ROS: Allergies and arthralgias but no fevers or chills, productive cough, hemoptysis,  dysphasia, odynophagia, melena, hematochezia, dysuria, hematuria, rash, seizure activity, orthopnea, PND, pedal edema, claudication. Remaining systems are negative.  Physical Exam: Well-developed well-nourished in no acute distress.  Skin is warm and dry.  HEENT is normal.  Neck is supple.  Chest is clear to auscultation with normal expansion.  Cardiovascular exam is regular rate and rhythm.  2/6 systolic murmur left sternal border. Abdominal exam nontender or distended. No masses palpated. Extremities show no edema. neuro grossly intact  ECG-sinus rhythm with occasional PVC, right bundle branch block.  Cannot rule out prior inferior infarct.  Personally reviewed  A/P  1 coronary artery disease-patient denies chest pain.  Continue medical therapy.  Continue aspirin and statin.  2 hypertension-patient's blood pressure is mildly elevated.  Add hydrochlorothiazide 12.5 mg daily.  Check potassium and renal function in 1 week.  3 hyperlipidemia-continue statin.  Check lipids and liver.  4 Previous abdominal aortic ectasia-no evidence of aneurysm on most recent abdominal ultrasound.  Kirk Ruths, MD

## 2018-08-23 ENCOUNTER — Ambulatory Visit (INDEPENDENT_AMBULATORY_CARE_PROVIDER_SITE_OTHER): Payer: Medicare Other | Admitting: Cardiology

## 2018-08-23 ENCOUNTER — Encounter: Payer: Self-pay | Admitting: Cardiology

## 2018-08-23 ENCOUNTER — Other Ambulatory Visit: Payer: Self-pay

## 2018-08-23 VITALS — BP 142/70 | HR 68 | Ht 71.0 in | Wt 227.0 lb

## 2018-08-23 DIAGNOSIS — E785 Hyperlipidemia, unspecified: Secondary | ICD-10-CM

## 2018-08-23 DIAGNOSIS — I10 Essential (primary) hypertension: Secondary | ICD-10-CM | POA: Diagnosis not present

## 2018-08-23 DIAGNOSIS — Z951 Presence of aortocoronary bypass graft: Secondary | ICD-10-CM

## 2018-08-23 DIAGNOSIS — I25810 Atherosclerosis of coronary artery bypass graft(s) without angina pectoris: Secondary | ICD-10-CM

## 2018-08-23 MED ORDER — HYDROCHLOROTHIAZIDE 12.5 MG PO CAPS: 12.5000 mg | ORAL_CAPSULE | Freq: Every day | ORAL | 3 refills | Status: DC

## 2018-08-23 NOTE — Patient Instructions (Signed)
Medication Instructions:  START HCTZ 12.5 MG ONCE DAILY  If you need a refill on your cardiac medications before your next appointment, please call your pharmacy.   Lab work: Your physician recommends that you return for lab work in: [place] If you have labs (blood work) drawn today and your tests are completely normal, you will receive your results only by: Marland Kitchen MyChart Message (if you have MyChart) OR . A paper copy in the mail If you have any lab test that is abnormal or we need to change your treatment, we will call you to review the results.  Follow-Up: At [facility], you and your health needs are our priority.  As part of our continuing mission to provide you with exceptional heart care, we have created designated Provider Care Teams.  These Care Teams include your primary Cardiologist (physician) and Advanced Practice Providers (APPs -  Physician Assistants and Nurse Practitioners) who all work together to provide you with the care you need, when you need it. You will need a follow up appointment in 12 months.  Please call our office 2 months in advance to schedule this appointment.  You may see Kirk Ruths MD or one of the following Advanced Practice Providers on your designated Care Team:   Kerin Ransom, PA-C Roby Lofts, [place] . Sande Rives, PA-C .

## 2018-08-31 DIAGNOSIS — E785 Hyperlipidemia, unspecified: Secondary | ICD-10-CM | POA: Diagnosis not present

## 2018-09-01 ENCOUNTER — Encounter: Payer: Self-pay | Admitting: *Deleted

## 2018-09-01 LAB — COMPREHENSIVE METABOLIC PANEL
ALT: 21 IU/L (ref 0–44)
AST: 15 IU/L (ref 0–40)
Albumin/Globulin Ratio: 1.4 (ref 1.2–2.2)
Albumin: 4.4 g/dL (ref 3.7–4.7)
Alkaline Phosphatase: 68 IU/L (ref 39–117)
BUN/Creatinine Ratio: 21 (ref 10–24)
BUN: 22 mg/dL (ref 8–27)
Bilirubin Total: 0.5 mg/dL (ref 0.0–1.2)
CO2: 25 mmol/L (ref 20–29)
Calcium: 9.9 mg/dL (ref 8.6–10.2)
Chloride: 100 mmol/L (ref 96–106)
Creatinine, Ser: 1.06 mg/dL (ref 0.76–1.27)
GFR calc Af Amer: 79 mL/min/{1.73_m2} (ref >59)
GFR calc non Af Amer: 68 mL/min/{1.73_m2} (ref >59)
Globulin, Total: 3.1 g/dL (ref 1.5–4.5)
Glucose: 114 mg/dL — ABNORMAL HIGH (ref 65–99)
Potassium: 4.5 mmol/L (ref 3.5–5.2)
Sodium: 142 mmol/L (ref 134–144)
Total Protein: 7.5 g/dL (ref 6.0–8.5)

## 2018-09-01 LAB — LIPID PANEL
Chol/HDL Ratio: 3.1 ratio (ref 0.0–5.0)
Cholesterol, Total: 136 mg/dL (ref 100–199)
HDL: 44 mg/dL (ref >39)
LDL Calculated: 71 mg/dL (ref 0–99)
Triglycerides: 103 mg/dL (ref 0–149)
VLDL Cholesterol Cal: 21 mg/dL (ref 5–40)

## 2018-09-06 ENCOUNTER — Other Ambulatory Visit: Payer: Self-pay | Admitting: Family Medicine

## 2018-09-06 DIAGNOSIS — M25512 Pain in left shoulder: Secondary | ICD-10-CM

## 2018-09-06 DIAGNOSIS — G8929 Other chronic pain: Secondary | ICD-10-CM

## 2018-09-16 ENCOUNTER — Other Ambulatory Visit: Payer: Self-pay | Admitting: Family Medicine

## 2018-10-20 DIAGNOSIS — J301 Allergic rhinitis due to pollen: Secondary | ICD-10-CM | POA: Diagnosis not present

## 2018-10-26 ENCOUNTER — Other Ambulatory Visit: Payer: Self-pay | Admitting: Cardiology

## 2018-10-26 DIAGNOSIS — I25810 Atherosclerosis of coronary artery bypass graft(s) without angina pectoris: Secondary | ICD-10-CM

## 2018-10-27 DIAGNOSIS — Z23 Encounter for immunization: Secondary | ICD-10-CM | POA: Diagnosis not present

## 2018-11-06 ENCOUNTER — Other Ambulatory Visit: Payer: Self-pay | Admitting: Family Medicine

## 2018-11-06 DIAGNOSIS — I10 Essential (primary) hypertension: Secondary | ICD-10-CM

## 2018-11-11 DIAGNOSIS — L500 Allergic urticaria: Secondary | ICD-10-CM | POA: Diagnosis not present

## 2018-12-10 ENCOUNTER — Other Ambulatory Visit: Payer: Self-pay | Admitting: Family Medicine

## 2018-12-10 DIAGNOSIS — M25512 Pain in left shoulder: Secondary | ICD-10-CM

## 2018-12-10 DIAGNOSIS — G8929 Other chronic pain: Secondary | ICD-10-CM

## 2018-12-10 DIAGNOSIS — I10 Essential (primary) hypertension: Secondary | ICD-10-CM

## 2018-12-11 NOTE — Telephone Encounter (Signed)
Requesting:   Tramadol Contract:   04/11/2017 UDS:   none Last Visit:  06/01/2018 Next Visit:   12/18/2018 Last Refill:    #30 no refills on 05/31/2018  Please Advise

## 2018-12-15 ENCOUNTER — Other Ambulatory Visit: Payer: Self-pay

## 2018-12-18 ENCOUNTER — Other Ambulatory Visit: Payer: Self-pay

## 2018-12-18 ENCOUNTER — Encounter: Payer: Self-pay | Admitting: Family Medicine

## 2018-12-18 ENCOUNTER — Ambulatory Visit (INDEPENDENT_AMBULATORY_CARE_PROVIDER_SITE_OTHER): Payer: Medicare Other | Admitting: Family Medicine

## 2018-12-18 VITALS — BP 118/76 | HR 62 | Temp 97.0°F | Ht 72.0 in | Wt 221.5 lb

## 2018-12-18 DIAGNOSIS — G8929 Other chronic pain: Secondary | ICD-10-CM

## 2018-12-18 DIAGNOSIS — R7303 Prediabetes: Secondary | ICD-10-CM | POA: Diagnosis not present

## 2018-12-18 DIAGNOSIS — E039 Hypothyroidism, unspecified: Secondary | ICD-10-CM

## 2018-12-18 DIAGNOSIS — M25512 Pain in left shoulder: Secondary | ICD-10-CM

## 2018-12-18 DIAGNOSIS — I10 Essential (primary) hypertension: Secondary | ICD-10-CM | POA: Diagnosis not present

## 2018-12-18 DIAGNOSIS — G4700 Insomnia, unspecified: Secondary | ICD-10-CM

## 2018-12-18 DIAGNOSIS — N400 Enlarged prostate without lower urinary tract symptoms: Secondary | ICD-10-CM

## 2018-12-18 DIAGNOSIS — M25511 Pain in right shoulder: Secondary | ICD-10-CM

## 2018-12-18 LAB — TSH: TSH: 1.9 u[IU]/mL (ref 0.35–4.50)

## 2018-12-18 LAB — COMPREHENSIVE METABOLIC PANEL
ALT: 15 U/L (ref 0–53)
AST: 13 U/L (ref 0–37)
Albumin: 4.1 g/dL (ref 3.5–5.2)
Alkaline Phosphatase: 53 U/L (ref 39–117)
BUN: 21 mg/dL (ref 6–23)
CO2: 29 mEq/L (ref 19–32)
Calcium: 9.5 mg/dL (ref 8.4–10.5)
Chloride: 102 mEq/L (ref 96–112)
Creatinine, Ser: 0.99 mg/dL (ref 0.40–1.50)
GFR: 73.53 mL/min (ref >60.00)
Glucose, Bld: 110 mg/dL — ABNORMAL HIGH (ref 70–99)
Potassium: 4.8 mEq/L (ref 3.5–5.1)
Sodium: 139 mEq/L (ref 135–145)
Total Bilirubin: 0.5 mg/dL (ref 0.2–1.2)
Total Protein: 7.2 g/dL (ref 6.0–8.3)

## 2018-12-18 LAB — T4, FREE: Free T4: 0.93 ng/dL (ref 0.60–1.60)

## 2018-12-18 LAB — HEMOGLOBIN A1C: Hgb A1c MFr Bld: 6.2 % (ref 4.6–6.5)

## 2018-12-18 MED ORDER — METOPROLOL SUCCINATE ER 25 MG PO TB24: 25.0000 mg | ORAL_TABLET | Freq: Every day | ORAL | 2 refills | Status: DC

## 2018-12-18 MED ORDER — ZOLPIDEM TARTRATE 5 MG PO TABS: 5.0000 mg | ORAL_TABLET | Freq: Every evening | ORAL | 5 refills | Status: DC | PRN

## 2018-12-18 MED ORDER — METHYLPREDNISOLONE ACETATE 40 MG/ML IJ SUSP
40.0000 mg | Freq: Once | INTRAMUSCULAR | Status: AC
  Administered 2018-12-18: 40 mg via INTRA_ARTICULAR

## 2018-12-18 MED ORDER — MELOXICAM 15 MG PO TABS: ORAL_TABLET | ORAL | 0 refills | Status: DC

## 2018-12-18 MED ORDER — LEVOTHYROXINE SODIUM 137 MCG PO TABS: ORAL_TABLET | ORAL | 2 refills | Status: DC

## 2018-12-18 NOTE — Patient Instructions (Addendum)
Give [place] 2-3 business days to get the results of your labs back.   Keep the diet clean and stay active.  Aim to do some physical exertion for 150 minutes per week. This is typically divided into 5 days per week, 30 minutes per day. The activity should be enough to get your heart rate up. Anything is better than nothing if you have time constraints.  Continue doing the stretches/exercises for your shoulders. If you are getting worse despite the injections, let me know. Therapy vs seeing the ortho team would be the next step.    If you ever want to see a sleep doctor, let me know.   Let us know if you need anything.

## 2018-12-18 NOTE — Progress Notes (Signed)
Chief Complaint  Patient presents with  . Follow-up    Subjective Jeffrey Rhodes is a 78 y.o. male who presents for hypertension follow up. He does not monitor home blood pressures. He is compliant with medications- Norvasc 10 mg/d, Toprol XL 25 mg/d, HCTZ 12.5 mg/d. Patient has these side effects of medication: none He is usually adhering to a healthy diet overall. Current exercise: stretches/exercises for shoulder, LE's  Hypothyroidism Patient presents for follow-up of hypothyroidism.  Reports compliance with medication. Current symptoms include: denies fatigue, weight changes, heat/cold intolerance, bowel/skin changes or CVS symptoms He believes his dose should be unchanged  Patient has a history of chronic bilateral shoulder pain, worse on the right.  He has a history of receiving steroid injections that had worked well for several months at a time.  He is interested in getting this again.  No recent injury or change in activity.  He tries to be compliant with the stretches and exercises.  He does have some reduced range of motion of the right shoulder.  He was offered physical therapy but declined.  He continues to decline today.  No new numbness, tingling, or weakness.  Patient has a history of prediabetes.  Diet and exercise as noted above.  He is having his blood tested today.  No vision changes.  The patient has a history of insomnia.  He has tried various medications including doxepin, full dose TCAs, trazodone, and Belsomra.  He is currently on Ambien.  He has also undergone cognitive behavioral therapy with some improvement.  Certain things seem to make him more restless as they also increase his stress.  It is not bothering him enough to see a sleep specialist at this time.  The patient has a history of BPH.  Feels symptoms are well controlled.  No adverse effects.  Reports compliance.   Past Medical History:  Diagnosis Date  . Allergy   . Arthritis   . Coronary artery  disease   . High cholesterol   . Hypertension   . Hypothyroid   . Myocardial infarction [facility])     Review of Systems Cardiovascular: no chest pain Respiratory:  no shortness of breath  Exam BP 118/76 (BP Location: Left Arm, Patient Position: Sitting, Cuff Size: Normal)   Pulse 62   Temp (!) 97 F (36.1 C) (Temporal)   Ht 6' (1.829 m)   Wt 221 lb 8 oz (100.5 kg)   SpO2 (!) 9%   BMI 30.04 kg/m  General:  well developed, well nourished, in no apparent distress Heart: RRR, no bruits, no LE edema Lungs: clear to auscultation, no accessory muscle use MSK: + Neer's on the right, there is decreased range of motion, negative crossover, speeds, Hawkins, liftoff; left shoulder with a positive Neer's, other testing is unremarkable with normal range of motion; there is no tenderness to palpation Psych: well oriented with normal range of affect and appropriate judgment/insight  Procedure Note; Shoulder bursa injection, left Verbal consent obtained. The area was palpated, an area was marked just caudal to the acromion process laterally, and cleaned with Betadine x1. A 27-gauge needle was used to enter the joint laterally with ease. 40 mg of Depomedrol with 2 mL of 1% lidocaine was injected. The patient tolerated the procedure well. There were no complications noted.  Procedure Note; Shoulder joint injection, right Verbal consent obtained. The area was palpated, an area was marked posterior to the acromion process approximately 2 cm inferiorly, and cleaned with alcohol x1. A 27-gauge needle,  while aiming towards the coracoid process, was used to enter the joint posteriorally with ease. 40 mg of Depo-Medrol with 2 mL of 1% lidocaine was injected. The patient tolerated the procedure well. There were no complications noted.   Essential hypertension, benign - Plan: Comprehensive metabolic panel, metoprolol succinate (TOPROL-XL) 25 MG 24 hr tablet  Hypothyroidism, unspecified type - Plan: T4,  free, TSH, levothyroxine (SYNTHROID) 137 MCG tablet  Chronic pain of both shoulders - Plan: PR DRAIN/INJECT LARGE JOINT/BURSA, meloxicam (MOBIC) 15 MG tablet, methylPREDNISolone acetate (DEPO-MEDROL) injection 40 mg  Prediabetes - Plan: Hemoglobin A1c  Insomnia, unspecified type - Plan: zolpidem (AMBIEN) 5 MG tablet  Benign prostatic hyperplasia, unspecified whether lower urinary tract symptoms present  1-continue medications, counseled on diet and exercise 2-check level today, likely continue Synthroid 3-continue stretches and exercises.  I injected the shoulder joint today on the right as I am concerned for adhesive capsulitis.  Bursa injection completed on the left.  If no improvement, will need to see physical therapy. 4-check A1c, counseled on diet and exercise 5-offered referral to sleep team.  He declined at this time.  Continue Ambien for now. 6-continue Flomax F/u in 6 mo. The patient voiced understanding and agreement to the plan.  [place], DO 12/18/18  8:32 AM

## 2019-01-11 DIAGNOSIS — M19011 Primary osteoarthritis, right shoulder: Secondary | ICD-10-CM | POA: Diagnosis not present

## 2019-01-11 DIAGNOSIS — M7541 Impingement syndrome of right shoulder: Secondary | ICD-10-CM | POA: Diagnosis not present

## 2019-01-11 DIAGNOSIS — J019 Acute sinusitis, unspecified: Secondary | ICD-10-CM | POA: Diagnosis not present

## 2019-01-17 ENCOUNTER — Other Ambulatory Visit: Payer: Self-pay

## 2019-02-13 ENCOUNTER — Telehealth: Payer: Self-pay | Admitting: *Deleted

## 2019-02-13 NOTE — Telephone Encounter (Signed)
Ok to hold asa and resume following surgery Kirk Ruths

## 2019-02-13 NOTE — Telephone Encounter (Signed)
   [facility] Pre-operative Risk Assessment    Request for surgical clearance:  1. What type of surgery is being performed? LEFT REVERSE TOTAL SHOULDER   2. When is this surgery scheduled?  TBD  3. What type of clearance is required (medical clearance vs. Pharmacy clearance to hold med vs. Both)?  MEDICAL  4. Are there any medications that need to be held prior to surgery and how long?ASPIRIN  5. Practice name and name of physician performing surgery?  [facility] ; DR Remo Lipps [facility]  6. What is your office phone number  [phone]   7.   What is your office fax number [phone]  8.   Anesthesia type (None, local, MAC, general) ? GENERAL   Raiford Simmonds 02/13/2019, 1:22 PM  _________________________________________________________________   (provider comments below)

## 2019-02-13 NOTE — Telephone Encounter (Signed)
   Primary Cardiologist: Kirk Ruths, MD  Chart reviewed as part of pre-operative protocol coverage. Patient was contacted 02/13/2019 in reference to pre-operative risk assessment for pending surgery as outlined below.  Franco Edmundson was last seen on 08/23/2018 by Dr. Stanford Breed.  Since that day, Jayesh Kucharski has done well with no cardiac complaints.  He still works driving a truck, climbs stairs, mows grass. He is able to achieve >4 Mets of activity without exertional symptoms.   Per Dr. Stanford Breed, [facility] to hold asa as needed prior to procedure and resume following surgery.  Therefore, based on ACC/AHA guidelines, the patient would be at acceptable risk for the planned procedure without further cardiovascular testing.   I will route this recommendation to the requesting party via Epic fax function and remove from pre-op pool.  Please call with questions.  Daune Perch, NP 02/13/2019, 4:31 PM

## 2019-02-13 NOTE — Telephone Encounter (Signed)
Dr. Stanford Breed, this pt has hx of CABG in 2014 and has been stable. He is planned for reverse total shoulder surgery. They are requesting to hold aspirin. Is it OK to hold or should we recommend to continue aspirin with surgery if possible?  Please route response back to P CV DIV PREOP  Thanks

## 2019-03-09 ENCOUNTER — Ambulatory Visit: Admit: 2019-03-09 | Payer: Medicare Other | Admitting: Orthopedic Surgery

## 2019-03-09 SURGERY — ARTHROPLASTY, SHOULDER, TOTAL, REVERSE
Anesthesia: General | Site: Shoulder | Laterality: Right

## 2019-03-10 ENCOUNTER — Other Ambulatory Visit: Payer: Self-pay | Admitting: Family Medicine

## 2019-03-23 ENCOUNTER — Ambulatory Visit: Admit: 2019-03-23 | Payer: Medicare Other | Admitting: Orthopedic Surgery

## 2019-03-23 SURGERY — ARTHROPLASTY, SHOULDER, TOTAL, REVERSE
Anesthesia: General | Site: Shoulder | Laterality: Left

## 2019-03-31 DIAGNOSIS — H6503 Acute serous otitis media, bilateral: Secondary | ICD-10-CM | POA: Diagnosis not present

## 2019-03-31 DIAGNOSIS — Z20828 Contact with and (suspected) exposure to other viral communicable diseases: Secondary | ICD-10-CM | POA: Diagnosis not present

## 2019-03-31 DIAGNOSIS — J0100 Acute maxillary sinusitis, unspecified: Secondary | ICD-10-CM | POA: Diagnosis not present

## 2019-03-31 DIAGNOSIS — R0981 Nasal congestion: Secondary | ICD-10-CM | POA: Diagnosis not present

## 2019-04-02 ENCOUNTER — Other Ambulatory Visit: Payer: Self-pay | Admitting: Family Medicine

## 2019-04-02 DIAGNOSIS — M25511 Pain in right shoulder: Secondary | ICD-10-CM

## 2019-04-02 DIAGNOSIS — G8929 Other chronic pain: Secondary | ICD-10-CM

## 2019-04-12 ENCOUNTER — Other Ambulatory Visit: Payer: Self-pay | Admitting: Family Medicine

## 2019-04-24 DIAGNOSIS — Z23 Encounter for immunization: Secondary | ICD-10-CM | POA: Diagnosis not present

## 2019-04-28 ENCOUNTER — Other Ambulatory Visit: Payer: Self-pay | Admitting: Family Medicine

## 2019-04-28 DIAGNOSIS — G8929 Other chronic pain: Secondary | ICD-10-CM

## 2019-04-28 DIAGNOSIS — M25511 Pain in right shoulder: Secondary | ICD-10-CM

## 2019-05-07 ENCOUNTER — Other Ambulatory Visit: Payer: Self-pay | Admitting: Family Medicine

## 2019-05-07 DIAGNOSIS — G8929 Other chronic pain: Secondary | ICD-10-CM

## 2019-05-07 NOTE — Telephone Encounter (Signed)
Last OV---12/18/2018 Last RF---12/11/2018   #30 with 1 refill

## 2019-05-23 DIAGNOSIS — Z23 Encounter for immunization: Secondary | ICD-10-CM | POA: Diagnosis not present

## 2019-06-02 ENCOUNTER — Other Ambulatory Visit: Payer: Self-pay | Admitting: Family Medicine

## 2019-06-02 DIAGNOSIS — I10 Essential (primary) hypertension: Secondary | ICD-10-CM

## 2019-06-14 ENCOUNTER — Other Ambulatory Visit: Payer: Self-pay | Admitting: Cardiology

## 2019-06-14 DIAGNOSIS — I25810 Atherosclerosis of coronary artery bypass graft(s) without angina pectoris: Secondary | ICD-10-CM

## 2019-06-18 ENCOUNTER — Ambulatory Visit: Payer: Medicare Other | Admitting: *Deleted

## 2019-06-18 ENCOUNTER — Other Ambulatory Visit: Payer: Self-pay

## 2019-06-18 ENCOUNTER — Encounter: Payer: Self-pay | Admitting: Family Medicine

## 2019-06-18 ENCOUNTER — Ambulatory Visit (INDEPENDENT_AMBULATORY_CARE_PROVIDER_SITE_OTHER): Payer: Medicare Other | Admitting: Family Medicine

## 2019-06-18 VITALS — BP 122/78 | HR 62 | Temp 96.0°F | Ht 72.0 in | Wt 224.4 lb

## 2019-06-18 DIAGNOSIS — G8929 Other chronic pain: Secondary | ICD-10-CM | POA: Diagnosis not present

## 2019-06-18 DIAGNOSIS — G4700 Insomnia, unspecified: Secondary | ICD-10-CM | POA: Diagnosis not present

## 2019-06-18 DIAGNOSIS — H6123 Impacted cerumen, bilateral: Secondary | ICD-10-CM

## 2019-06-18 DIAGNOSIS — M25512 Pain in left shoulder: Secondary | ICD-10-CM | POA: Diagnosis not present

## 2019-06-18 DIAGNOSIS — Z79899 Other long term (current) drug therapy: Secondary | ICD-10-CM

## 2019-06-18 DIAGNOSIS — N400 Enlarged prostate without lower urinary tract symptoms: Secondary | ICD-10-CM | POA: Diagnosis not present

## 2019-06-18 DIAGNOSIS — M25511 Pain in right shoulder: Secondary | ICD-10-CM | POA: Diagnosis not present

## 2019-06-18 MED ORDER — METHYLPREDNISOLONE ACETATE 40 MG/ML IJ SUSP
40.0000 mg | Freq: Once | INTRAMUSCULAR | Status: AC
  Administered 2019-06-18: 08:00:00 40 mg via INTRA_ARTICULAR

## 2019-06-18 MED ORDER — ZOLPIDEM TARTRATE 5 MG PO TABS: 5.0000 mg | ORAL_TABLET | Freq: Every evening | ORAL | 5 refills | Status: DC | PRN

## 2019-06-18 MED ORDER — TRAMADOL HCL 50 MG PO TABS: ORAL_TABLET | ORAL | 2 refills | Status: DC

## 2019-06-18 MED ORDER — TAMSULOSIN HCL 0.4 MG PO CAPS: 0.4000 mg | ORAL_CAPSULE | Freq: Every day | ORAL | 2 refills | Status: DC

## 2019-06-18 NOTE — Progress Notes (Addendum)
Chief Complaint  Patient presents with  . Follow-up    Subjective: Patient is a 79 y.o. male here for f/u.  Patient has a history of chronic bilateral shoulder pain, worse in the right.  He responded well to an injection several months ago.  He last saw the orthopedic surgery team and was scheduled for a procedure but it was canceled due to rising Covid cases.  He has not been rescheduled.  He would like to see another provider for second opinion.  The patient has a history of chronic insomnia.  He takes Ambien 5 mg nightly.  Due to the shoulder pain, he is not sleeping well, but Ambien does help.  He is not having any side effects.  Patient has a history of BPH.  He urinates frequently but markedly less so since taking the Flomax 0.4 mg daily.  He is compliant with his medication and does not notice any adverse effects.  He denies lightheadedness with position changes specifically.  Past Medical History:  Diagnosis Date  . Allergy   . Arthritis   . Coronary artery disease   . High cholesterol   . Hypertension   . Hypothyroid   . Myocardial infarction (HCC)     Objective: BP 122/78 (BP Location: Left Arm, Patient Position: Sitting, Cuff Size: Normal)   Pulse 62   Temp (!) 96 F (35.6 C) (Temporal)   Ht 6' (1.829 m)   Wt 224 lb 6 oz (101.8 kg)   SpO2 97%   BMI 30.43 kg/m  General: Awake, appears stated age HEENT: Canals 100% obstructed with cerumen, patent bilaterally without TM abnormalities after flush Heart: RRR MSK: No tenderness to palpation of the right shoulder, decreased active and passive range of motion; positive Neer's, Hawkins, and empty can on the right Lungs: CTAB, no rales, wheezes or rhonchi. No accessory muscle use Psych: Age appropriate judgment and insight, normal affect and mood  Procedure Note; Shoulder bursa injection Verbal consent obtained. The area was palpated, an area was marked just caudal to the acromion process laterally, and cleaned with  Betadine x1. A 27-gauge needle was used to enter the joint laterally with ease. 40 mg of Depomedrol with 2 mL of 1% lidocaine was injected. The patient tolerated the procedure well. There were no complications noted.  Procedure note: Cerumen removal irrigation, bilateral Verbal consent obtained. Robin Ewing, CMA performed procedure. A mixture of warm water and Dulcolax was used to irrigate ear. Cerumen successfully removed. Pt reported immediate improvement. Pt tolerated procedure well. There were no immediate complications noted.  Assessment and Plan: Benign prostatic hyperplasia, unspecified whether lower urinary tract symptoms present  Insomnia, unspecified type - Plan: zolpidem (AMBIEN) 5 MG tablet  Chronic pain of both shoulders - Plan: traMADol (ULTRAM) 50 MG tablet, Ambulatory referral to Orthopedic Surgery, methylPREDNISolone acetate (DEPO-MEDROL) injection 40 mg  Encounter for long-term (current) use of high-risk medication - Plan: Pain Mgmt, Profile 8 w/Conf, U  Bilateral impacted cerumen  1-continue Flomax Continue Ambien, controlled substance contract and UDS today. 3-injection today.  Refer to a different orthopod at his request.  Stretches and exercises encouraged.  I think he is dealing with frozen shoulder as well. 4-as above 5-successful removal. Follow-up in 6 months. The patient voiced understanding and agreement to the plan.  [place], DO 06/18/19  10:23 AM

## 2019-06-18 NOTE — Patient Instructions (Addendum)
If you do not hear anything about your referral in the next 1-2 weeks, call our office and ask for an update.  Keep doing the stretches/exercises for your shoulders.  Heat (pad or rice pillow in microwave) over affected area, 10-15 minutes twice daily.   OK to use Debrox (peroxide) in the ear to loosen up wax. Also recommend using a bulb syringe (for removing boogers from baby's noses) to flush through warm water. Do not use Q-tips as this can impact wax further.  Let Jeffrey Rhodes know if you need anything.

## 2019-07-07 DIAGNOSIS — W57XXXA Bitten or stung by nonvenomous insect and other nonvenomous arthropods, initial encounter: Secondary | ICD-10-CM | POA: Diagnosis not present

## 2019-07-07 DIAGNOSIS — L299 Pruritus, unspecified: Secondary | ICD-10-CM | POA: Diagnosis not present

## 2019-07-13 ENCOUNTER — Ambulatory Visit: Payer: Medicare Other | Admitting: Orthopedic Surgery

## 2019-07-16 DIAGNOSIS — M19011 Primary osteoarthritis, right shoulder: Secondary | ICD-10-CM | POA: Diagnosis not present

## 2019-07-16 DIAGNOSIS — M25512 Pain in left shoulder: Secondary | ICD-10-CM | POA: Diagnosis not present

## 2019-07-16 DIAGNOSIS — M25511 Pain in right shoulder: Secondary | ICD-10-CM | POA: Diagnosis not present

## 2019-07-16 DIAGNOSIS — M19012 Primary osteoarthritis, left shoulder: Secondary | ICD-10-CM | POA: Diagnosis not present

## 2019-07-26 DIAGNOSIS — T07XXXA Unspecified multiple injuries, initial encounter: Secondary | ICD-10-CM | POA: Diagnosis not present

## 2019-07-26 DIAGNOSIS — S80261A Insect bite (nonvenomous), right knee, initial encounter: Secondary | ICD-10-CM | POA: Diagnosis not present

## 2019-07-26 DIAGNOSIS — J309 Allergic rhinitis, unspecified: Secondary | ICD-10-CM | POA: Diagnosis not present

## 2019-09-06 ENCOUNTER — Other Ambulatory Visit: Payer: Self-pay | Admitting: Family Medicine

## 2019-09-06 DIAGNOSIS — E039 Hypothyroidism, unspecified: Secondary | ICD-10-CM

## 2019-09-07 ENCOUNTER — Other Ambulatory Visit: Payer: Self-pay | Admitting: Family Medicine

## 2019-09-07 DIAGNOSIS — I10 Essential (primary) hypertension: Secondary | ICD-10-CM

## 2019-12-07 NOTE — Progress Notes (Signed)
HPI: Follow-up coronary artery disease. [place] he was admitted with chest pain and ruled out. His symptoms were felt to be atypical. Nuclear study showed an ejection fraction of 58%. No infarct or ischemia.Abd ultrasound 9/19 showed no AAA.Since last seenpatient denies dyspnea, chest pain, palpitations or syncope.  Current Outpatient Medications  Medication Sig Dispense Refill  . acetaminophen (TYLENOL) 500 MG tablet Take 500 mg by mouth every 8 (eight) hours as needed for mild pain.     Marland Kitchen amLODipine (NORVASC) 10 MG tablet Take 1 tablet (10 mg total) by mouth daily. 90 tablet 2  . fluticasone (FLONASE) 50 MCG/ACT nasal spray Place 2 sprays into both nostrils daily as needed for allergies or rhinitis. 16 g 5  . latanoprost (XALATAN) 0.005 % ophthalmic solution Place 1 drop into both eyes at bedtime.    Marland Kitchen levothyroxine (SYNTHROID) 137 MCG tablet TAKE 1 TABLET (137 MCG TOTAL) BY MOUTH DAILY BEFORE BREAKFAST. 90 tablet 2  . LIVALO 4 MG TABS TAKE 1 TABLET BY MOUTH EVERY DAY 90 tablet 2  . metoprolol succinate (TOPROL-XL) 25 MG 24 hr tablet TAKE 1 TABLET BY MOUTH EVERY DAY 90 tablet 2  . tamsulosin (FLOMAX) 0.4 MG CAPS capsule Take 1 capsule (0.4 mg total) by mouth daily. 90 capsule 2  . traMADol (ULTRAM) 50 MG tablet Take nightly as needed for pain. 90 tablet 2  . zolpidem (AMBIEN) 5 MG tablet Take 1 tablet (5 mg total) by mouth at bedtime as needed for sleep. 30 tablet 5  . OVER THE COUNTER MEDICATION Super Beta Prostate (Patient not taking: Reported on 12/12/2019)     No current facility-administered medications for this visit.     Past Medical History:  Diagnosis Date  . Allergy   . Arthritis   . Coronary artery disease   . High cholesterol   . Hypertension   . Hypothyroid     Past Surgical History:  Procedure Laterality Date  . CORONARY ARTERY BYPASS GRAFT  2014   [place]  . DENTAL  RESTORATION/EXTRACTION WITH X-RAY      Social History   Socioeconomic History  . Marital status: Widowed    Spouse name: Not on file  . Number of children: 2  . Years of education: Not on file  . Highest education level: Not on file  Occupational History  . Occupation: Designer, jewellery  Tobacco Use  . Smoking status: Former Smoker    Types: Cigarettes  . Smokeless tobacco: Never Used  . Tobacco comment: 2003 quit  Vaping Use  . Vaping Use: Never used  Substance and Sexual Activity  . Alcohol use: Yes    Alcohol/week: 0.0 standard drinks    Comment: occasional  . Drug use: No  . Sexual activity: Not on file  Other Topics Concern  . Not on file  Social History Narrative  . Not on file   Social Determinants of Health   Financial Resource Strain:   . Difficulty of Paying Living Expenses: Not on file  Food Insecurity:   . Worried About Charity fundraiser in the Last Year: Not on file  . Ran Out of Food in the Last Year: Not on file  Transportation Needs:   . Lack of Transportation (Medical): Not on file  . Lack of Transportation (Non-Medical): Not on file  Physical Activity:   . Days of Exercise per Week: Not on file  .  Minutes of Exercise per Session: Not on file  Stress:   . Feeling of Stress : Not on file  Social Connections:   . Frequency of Communication with Friends and Family: Not on file  . Frequency of Social Gatherings with Friends and Family: Not on file  . Attends Religious Services: Not on file  . Active Member of Clubs or Organizations: Not on file  . Attends Archivist Meetings: Not on file  . Marital Status: Not on file  Intimate Partner Violence:   . Fear of Current or Ex-Partner: Not on file  . Emotionally Abused: Not on file  . Physically Abused: Not on file  . Sexually Abused: Not on file    Family History  Problem Relation Age of Onset  . Breast cancer Mother   . Diabetes Brother   . Heart disease Brother   . Colon cancer  Neg Hx     ROS: no fevers or chills, productive cough, hemoptysis, dysphasia, odynophagia, melena, hematochezia, dysuria, hematuria, rash, seizure activity, orthopnea, PND, pedal edema, claudication. Remaining systems are negative.  Physical Exam: Well-developed well-nourished in no acute distress.  Skin is warm and dry.  HEENT is normal.  Neck is supple.  Chest is clear to auscultation with normal expansion.  Cardiovascular exam is regular rate and rhythm.  2/6 systolic murmur left sternal border. Abdominal exam nontender or distended. No masses palpated. Extremities show no edema. neuro grossly intact  ECG-sinus bradycardia at a rate of 53, right bundle branch block, inferior infarct.  Personally reviewed  A/P  1 coronary artery disease-patient doing well with no chest pain.  Plan to continue medical therapy with aspirin and statin.  2 hypertension-patient's blood pressure is controlled.  Continue present medical regimen and follow.    3 hyperlipidemia-continue statin.    4 murmur-patient sounds to have aortic stenosis on examination.  We will schedule echocardiogram to further assess.  Kirk Ruths, MD

## 2019-12-12 ENCOUNTER — Ambulatory Visit (INDEPENDENT_AMBULATORY_CARE_PROVIDER_SITE_OTHER): Payer: Medicare Other | Admitting: Cardiology

## 2019-12-12 ENCOUNTER — Encounter: Payer: Self-pay | Admitting: Cardiology

## 2019-12-12 ENCOUNTER — Ambulatory Visit (INDEPENDENT_AMBULATORY_CARE_PROVIDER_SITE_OTHER): Payer: Medicare Other | Admitting: Family Medicine

## 2019-12-12 ENCOUNTER — Other Ambulatory Visit: Payer: Self-pay

## 2019-12-12 ENCOUNTER — Encounter: Payer: Self-pay | Admitting: Family Medicine

## 2019-12-12 VITALS — BP 128/70 | HR 65 | Temp 97.8°F | Ht 72.0 in | Wt 217.4 lb

## 2019-12-12 VITALS — BP 132/68 | HR 53 | Ht 72.0 in | Wt 223.0 lb

## 2019-12-12 DIAGNOSIS — E785 Hyperlipidemia, unspecified: Secondary | ICD-10-CM

## 2019-12-12 DIAGNOSIS — I25810 Atherosclerosis of coronary artery bypass graft(s) without angina pectoris: Secondary | ICD-10-CM

## 2019-12-12 DIAGNOSIS — R7303 Prediabetes: Secondary | ICD-10-CM | POA: Diagnosis not present

## 2019-12-12 DIAGNOSIS — I10 Essential (primary) hypertension: Secondary | ICD-10-CM | POA: Diagnosis not present

## 2019-12-12 DIAGNOSIS — Z23 Encounter for immunization: Secondary | ICD-10-CM

## 2019-12-12 DIAGNOSIS — G8929 Other chronic pain: Secondary | ICD-10-CM

## 2019-12-12 DIAGNOSIS — E039 Hypothyroidism, unspecified: Secondary | ICD-10-CM

## 2019-12-12 DIAGNOSIS — M25511 Pain in right shoulder: Secondary | ICD-10-CM

## 2019-12-12 DIAGNOSIS — G4700 Insomnia, unspecified: Secondary | ICD-10-CM

## 2019-12-12 DIAGNOSIS — M25512 Pain in left shoulder: Secondary | ICD-10-CM

## 2019-12-12 DIAGNOSIS — Z951 Presence of aortocoronary bypass graft: Secondary | ICD-10-CM | POA: Diagnosis not present

## 2019-12-12 DIAGNOSIS — I25119 Atherosclerotic heart disease of native coronary artery with unspecified angina pectoris: Secondary | ICD-10-CM | POA: Diagnosis not present

## 2019-12-12 DIAGNOSIS — Z1159 Encounter for screening for other viral diseases: Secondary | ICD-10-CM

## 2019-12-12 DIAGNOSIS — R011 Cardiac murmur, unspecified: Secondary | ICD-10-CM

## 2019-12-12 MED ORDER — TRAMADOL HCL 50 MG PO TABS: ORAL_TABLET | ORAL | 2 refills | Status: DC

## 2019-12-12 MED ORDER — FLUTICASONE PROPIONATE 50 MCG/ACT NA SUSP: 2.0000 | Freq: Every day | NASAL | 5 refills | Status: DC | PRN

## 2019-12-12 MED ORDER — ZOLPIDEM TARTRATE 5 MG PO TABS: 5.0000 mg | ORAL_TABLET | Freq: Every evening | ORAL | 5 refills | Status: DC | PRN

## 2019-12-12 MED ORDER — AMLODIPINE BESYLATE 10 MG PO TABS: 10.0000 mg | ORAL_TABLET | Freq: Every day | ORAL | 2 refills | Status: DC

## 2019-12-12 MED ORDER — METHYLPREDNISOLONE ACETATE 40 MG/ML IJ SUSP
40.0000 mg | Freq: Once | INTRAMUSCULAR | Status: AC
  Administered 2019-12-12: 40 mg via INTRA_ARTICULAR

## 2019-12-12 NOTE — Addendum Note (Signed)
Addended by: Sharon Seller B on: 12/12/2019 07:35 AM   Modules accepted: Orders

## 2019-12-12 NOTE — Patient Instructions (Addendum)
Give [place] 2-3 business days to get the results of your labs back.   Keep the diet clean and stay active.  Consider Voltaren (diclofenac) gel for joint pain.   Let us know if you need anything.

## 2019-12-12 NOTE — Progress Notes (Signed)
Chief Complaint  Patient presents with  . Follow-up    Subjective Jeffrey Rhodes is a 79 y.o. male who presents for hypertension follow up. He does not monitor home blood pressures. He is compliant with medications- Norvasc 10 mg/d, HCTZ 12.5 mg/d, Toprol XL 25 mg/d. Patient has these side effects of medication: none He is usually adhering to a healthy diet overall. Current exercise: walking, some wt resistance  +hx of insomnia. Taking Ambien 5 mg qhs prn. He has weaned down, but has difficulty sleeping off of it completely. He has failed Belsomra, doxepin, trazodone. No AE's.  Hyperlipidemia Patient presents for dyslipidemia follow up. Currently being treated with Livalo 4 mg/d and compliance with treatment thus far has been good. He denies myalgias. Diet/exercise as above.  The patient is known to have coexisting coronary artery disease.  Chronic R shoulder pain, requesting injection.    Past Medical History:  Diagnosis Date  . Allergy   . Arthritis   . Coronary artery disease   . High cholesterol   . Hypertension   . Hypothyroid     Exam BP 128/70 (BP Location: Left Arm, Patient Position: Sitting, Cuff Size: Normal)   Pulse 65   Temp 97.8 F (36.6 C) (Oral)   Ht 6' (1.829 m)   Wt 217 lb 6 oz (98.6 kg)   SpO2 95%   BMI 29.48 kg/m  General:  well developed, well nourished, in no apparent distress Heart: RRR, no bruits, no LE edema Lungs: clear to auscultation, no accessory muscle use Psych: well oriented with normal range of affect and appropriate judgment/insight  Procedure Note; R shoulder bursa injection Verbal consent obtained. The area was palpated, an area was marked just caudal to the acromion process laterally, and cleaned with alcohol x1. A 27-gauge needle was used to enter the joint laterally with ease. 40 mg of Depomedrol with 2 mL of 1% lidocaine was injected. The patient tolerated the procedure well. There were no complications noted.  Essential  hypertension, benign - Plan: amLODipine (NORVASC) 10 MG tablet  Coronary artery disease involving native coronary artery of native heart with angina pectoris ([place]) - Plan: Comprehensive metabolic panel, Lipid panel  Hypothyroidism, unspecified type - Plan: TSH, T4, free  Insomnia, unspecified type - Plan: zolpidem (AMBIEN) 5 MG tablet  Encounter for hepatitis C screening test for low risk patient - Plan: Hepatitis C antibody  Prediabetes - Plan: Hemoglobin A1c  Need for prophylactic vaccination against Haemophilus influenzae type B and hepatitis B - Plan: Flu Vaccine QUAD High Dose(Fluad)  Chronic pain of both shoulders - Plan: traMADol (ULTRAM) 50 MG tablet, PR DRAIN/INJECT LARGE JOINT/BURSA  1. OK to hold on ck'ing BP at home. Counseled on diet and exercise. Cont Norvasc 10 mg/d and Toprol XL 25 mg/d.  2. Cont Toprol and Livalo 4 mg/d. Ck lipids today. 3. Cont thyroid medication.  4. Cont Ambien 5 mg qhs prn.  5. Cont tramadol 50 mg bid prn pain. Cont stretches. Injection today in R shoulder. If no improvement, will refer to ortho.  6. Ck A1c.  F/u in 6 mo or prn. The patient voiced understanding and agreement to the plan.  [place], DO 12/12/19  7:21 AM

## 2019-12-12 NOTE — Patient Instructions (Signed)
  Testing/Procedures:  Your physician has requested that you have an echocardiogram. Echocardiography is a painless test that uses sound waves to create images of your heart. It provides your doctor with information about the size and shape of your heart and how well your heart's chambers and valves are working. This procedure takes approximately one hour. There are no restrictions for this procedure.[place] [facility]-1ST FLOOR IMAGING     Follow-Up: At [facility], you and your health needs are our priority.  As part of our continuing mission to provide you with exceptional heart care, we have created designated Provider Care Teams.  These Care Teams include your primary Cardiologist (physician) and Advanced Practice Providers (APPs -  Physician Assistants and Nurse Practitioners) who all work together to provide you with the care you need, when you need it.  We recommend signing up for the patient portal called "MyChart".  Sign up information is provided on this After Visit Summary.  MyChart is used to connect with patients for Virtual Visits (Telemedicine).  Patients are able to view lab/test results, encounter notes, upcoming appointments, etc.  Non-urgent messages can be sent to your provider as well.   To learn more about what you can do with MyChart, go to [email or web address].    Your next appointment:   12 month(s)  The format for your next appointment:   In Person  Provider:   Kirk Ruths, MD

## 2019-12-13 LAB — HEMOGLOBIN A1C
Hgb A1c MFr Bld: 5.9 % of total Hgb — ABNORMAL HIGH (ref <5.7)
Mean Plasma Glucose: 123 (calc)
eAG (mmol/L): 6.8 (calc)

## 2019-12-13 LAB — LIPID PANEL
Cholesterol: 141 mg/dL (ref <200)
HDL: 44 mg/dL (ref >=40)
LDL Cholesterol (Calc): 77 mg/dL (calc)
Non-HDL Cholesterol (Calc): 97 mg/dL (calc) (ref <130)
Total CHOL/HDL Ratio: 3.2 (calc) (ref <5.0)
Triglycerides: 116 mg/dL (ref <150)

## 2019-12-13 LAB — COMPREHENSIVE METABOLIC PANEL
AG Ratio: 1.2 (calc) (ref 1.0–2.5)
ALT: 11 U/L (ref 9–46)
AST: 14 U/L (ref 10–35)
Albumin: 4.1 g/dL (ref 3.6–5.1)
Alkaline phosphatase (APISO): 62 U/L (ref 35–144)
BUN: 17 mg/dL (ref 7–25)
CO2: 24 mmol/L (ref 20–32)
Calcium: 9.4 mg/dL (ref 8.6–10.3)
Chloride: 103 mmol/L (ref 98–110)
Creat: 0.96 mg/dL (ref 0.70–1.18)
Globulin: 3.3 g/dL (calc) (ref 1.9–3.7)
Glucose, Bld: 108 mg/dL — ABNORMAL HIGH (ref 65–99)
Potassium: 4.5 mmol/L (ref 3.5–5.3)
Sodium: 139 mmol/L (ref 135–146)
Total Bilirubin: 0.4 mg/dL (ref 0.2–1.2)
Total Protein: 7.4 g/dL (ref 6.1–8.1)

## 2019-12-13 LAB — T4, FREE: Free T4: 1.2 ng/dL (ref 0.8–1.8)

## 2019-12-13 LAB — HEPATITIS C ANTIBODY
Hepatitis C Ab: NONREACTIVE
SIGNAL TO CUT-OFF: 0.01 (ref <1.00)

## 2019-12-13 LAB — TSH: TSH: 2.03 mIU/L (ref 0.40–4.50)

## 2019-12-17 DIAGNOSIS — Z23 Encounter for immunization: Secondary | ICD-10-CM | POA: Diagnosis not present

## 2019-12-24 ENCOUNTER — Ambulatory Visit: Payer: Medicare Other | Admitting: Family Medicine

## 2019-12-31 ENCOUNTER — Ambulatory Visit (HOSPITAL_BASED_OUTPATIENT_CLINIC_OR_DEPARTMENT_OTHER)
Admission: RE | Admit: 2019-12-31 | Discharge: 2019-12-31 | Disposition: A | Discharge status: Home / Self Care | Payer: Medicare Other | Source: Ambulatory Visit | Attending: Cardiology | Admitting: Cardiology

## 2019-12-31 ENCOUNTER — Other Ambulatory Visit: Payer: Self-pay

## 2019-12-31 DIAGNOSIS — R011 Cardiac murmur, unspecified: Secondary | ICD-10-CM | POA: Insufficient documentation

## 2019-12-31 LAB — ECHOCARDIOGRAM COMPLETE
AR max vel: 1.87 cm2
AV Area VTI: 1.82 cm2
AV Area mean vel: 1.85 cm2
AV Mean grad: 7 mmHg
AV Peak grad: 14.2 mmHg
Ao pk vel: 1.89 m/s
Area-P 1/2: 3.5 cm2
S' Lateral: 4.16 cm

## 2020-02-26 ENCOUNTER — Telehealth: Payer: Self-pay | Admitting: Cardiology

## 2020-02-26 NOTE — Telephone Encounter (Signed)
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

## 2020-02-27 ENCOUNTER — Telehealth: Payer: Self-pay | Admitting: Cardiology

## 2020-02-27 NOTE — Telephone Encounter (Signed)
Spoke with pt, aware paperwork at the front desk for pick up.

## 2020-02-27 NOTE — Telephone Encounter (Signed)
Patient is calling back regarding his DOT papers. Would like for Dr. Jens Som or nurse to call about papers.

## 2020-02-28 ENCOUNTER — Telehealth: Payer: Self-pay | Admitting: *Deleted

## 2020-02-28 DIAGNOSIS — Z951 Presence of aortocoronary bypass graft: Secondary | ICD-10-CM

## 2020-02-28 NOTE — Telephone Encounter (Signed)
Discussed with dr Jens Som and due to the history of CABG, nuclear testing is the best testing. Order placed

## 2020-02-28 NOTE — Telephone Encounter (Signed)
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 Will discuss with dr Jens Som the best testing to do.

## 2020-04-28 ENCOUNTER — Other Ambulatory Visit: Payer: Self-pay | Admitting: Cardiology

## 2020-04-28 ENCOUNTER — Other Ambulatory Visit: Payer: Self-pay | Admitting: Family Medicine

## 2020-04-28 DIAGNOSIS — I10 Essential (primary) hypertension: Secondary | ICD-10-CM

## 2020-04-28 DIAGNOSIS — E039 Hypothyroidism, unspecified: Secondary | ICD-10-CM

## 2020-04-28 DIAGNOSIS — I25810 Atherosclerosis of coronary artery bypass graft(s) without angina pectoris: Secondary | ICD-10-CM

## 2020-04-29 ENCOUNTER — Other Ambulatory Visit: Payer: Self-pay | Admitting: Family Medicine

## 2020-06-09 ENCOUNTER — Encounter: Payer: Self-pay | Admitting: Family Medicine

## 2020-06-09 ENCOUNTER — Ambulatory Visit (INDEPENDENT_AMBULATORY_CARE_PROVIDER_SITE_OTHER): Payer: Medicare Other | Admitting: Family Medicine

## 2020-06-09 ENCOUNTER — Other Ambulatory Visit: Payer: Self-pay

## 2020-06-09 ENCOUNTER — Telehealth: Payer: Self-pay | Admitting: Family Medicine

## 2020-06-09 VITALS — BP 140/80 | HR 91 | Temp 97.9°F | Ht 72.0 in | Wt 224.5 lb

## 2020-06-09 DIAGNOSIS — I714 Abdominal aortic aneurysm, without rupture, unspecified: Secondary | ICD-10-CM | POA: Insufficient documentation

## 2020-06-09 DIAGNOSIS — Z79899 Other long term (current) drug therapy: Secondary | ICD-10-CM | POA: Diagnosis not present

## 2020-06-09 DIAGNOSIS — E039 Hypothyroidism, unspecified: Secondary | ICD-10-CM | POA: Diagnosis not present

## 2020-06-09 DIAGNOSIS — I10 Essential (primary) hypertension: Secondary | ICD-10-CM

## 2020-06-09 DIAGNOSIS — I25119 Atherosclerotic heart disease of native coronary artery with unspecified angina pectoris: Secondary | ICD-10-CM | POA: Diagnosis not present

## 2020-06-09 DIAGNOSIS — H6123 Impacted cerumen, bilateral: Secondary | ICD-10-CM

## 2020-06-09 DIAGNOSIS — G4700 Insomnia, unspecified: Secondary | ICD-10-CM

## 2020-06-09 MED ORDER — METHYLPREDNISOLONE ACETATE 80 MG/ML IJ SUSP
80.0000 mg | Freq: Once | INTRAMUSCULAR | Status: AC
  Administered 2020-06-09: 80 mg via INTRAMUSCULAR

## 2020-06-09 NOTE — Telephone Encounter (Signed)
Patient states he received a bill for 25.21. patient states he was told by his insurance the billed was paid in full. He would like a phone call

## 2020-06-09 NOTE — Progress Notes (Signed)
Chief Complaint  Patient presents with  . Follow-up    Subjective Jeffrey Rhodes is a 80 y.o. male who presents for hypertension follow up. He does not monitor home blood pressures. He is compliant with medications- Norvasc 10 mg, Toprol XL 25 mg/d. Patient has these side effects of medication: none He is usually adhering to a healthy diet overall. Current exercise: walking No CP or SOB.   Hyperlipidemia Patient presents for dyslipidemia follow up. Currently being treated with Livalo 4 mg/d and compliance with treatment thus far has been good. He denies myalgias. Diet/exercise. The patient is known to have coexisting coronary artery disease.  Hypothyroidism Patient presents for follow-up of hypothyroidism.  Reports compliance with medication- Synthroid 137 mcg/d. Current symptoms include: denies fatigue, weight changes, heat/cold intolerance, bowel/skin changes or CVS symptoms He believes his dose should be unchanged  Chronic joint pain Take tramadol intermittently. No AE's. Did well with dexamethasone IM injection in past, requesting today. No recent injuries.   Insomnia Taking Ambien. No AE's. Tried to wean down, went from 10 mg/d to 5 mg/d. Did CBT. Works well for him.     Past Medical History:  Diagnosis Date  . Allergy   . Arthritis   . Coronary artery disease   . High cholesterol   . Hypertension   . Hypothyroid     Exam BP 140/80 (BP Location: Left Arm, Patient Position: Sitting, Cuff Size: Normal)   Pulse 91   Temp 97.9 F (36.6 C) (Oral)   Ht 6' (1.829 m)   Wt 224 lb 8 oz (101.8 kg)   SpO2 96%   BMI 30.45 kg/m  General:  well developed, well nourished, in no apparent distress Heart: RRR, no bruits, no LE edema Lungs: clear to auscultation, no accessory muscle use Neck: Supple, symmetric, no masses or nodularity appreciated. Psych: well oriented with normal range of affect and appropriate judgment/insight  Abdominal aortic aneurysm (AAA) without  rupture (HCC)  Coronary artery disease involving native coronary artery of native heart with angina pectoris ([place]), Chronic  Essential hypertension, benign  Insomnia, unspecified type  Hypothyroidism, unspecified type  Encounter for long-term (current) use of high-risk medication - Plan: DRUG MONITORING, PANEL 8 WITH CONFIRMATION, URINE  For blood pressure, he will continue Norvasc 10 mg daily. Coronary artery disease, he will continue Livalo and metoprolol. For insomnia he will continue Ambien 5 mg nightly as needed.  He will update his controlled substance contract and leave [place] a urine sample today. For hypothyroidism, he will continue levothyroxine 137 mcg daily. F/u in 6 mo we will check labs at this time.. The patient voiced understanding and agreement to the plan.  [place], DO 06/09/20  7:34 AM

## 2020-06-09 NOTE — Addendum Note (Signed)
Addended by: Sharon Seller B on: 06/09/2020 07:55 AM   Modules accepted: Orders

## 2020-06-09 NOTE — Patient Instructions (Signed)
Heat (pad or rice pillow in microwave) over affected area, 10-15 minutes twice daily.   Ice/cold pack over area for 10-15 min twice daily.  OK to take Tylenol 1000 mg (2 extra strength tabs) or 975 mg (3 regular strength tabs) every 6 hours as needed.  OK to use Debrox (peroxide) in the ear to loosen up wax. Also recommend using a bulb syringe (for removing boogers from baby's noses) to flush through warm water and vinegar (3-4:1 ratio). An alternative, though more expensive, is an elephant ear washer wax removal kit. Do not use Q-tips as this can impact wax further.  Foods that may reduce pain: 1) Ginger 2) Blueberries 3) Salmon 4) Pumpkin seeds 5) dark chocolate 6) turmeric 7) tart cherries 8) virgin olive oil 9) chilli peppers 10) mint 11) red wine  Let us know if you need anything.

## 2020-06-11 ENCOUNTER — Ambulatory Visit: Payer: Medicare Other | Admitting: Family Medicine

## 2020-06-18 ENCOUNTER — Telehealth: Payer: Self-pay

## 2020-06-18 NOTE — Telephone Encounter (Signed)
Caller states he has a bill and is wanting to talk to someone about that.  Telephone: [phone]

## 2020-06-18 NOTE — Telephone Encounter (Signed)
Tried to call pt to  Advise someone from [place] would be reaching out to him, but phone just rang and rang and then went to a fast busy tone.

## 2020-06-18 NOTE — Telephone Encounter (Signed)
I have sent an email to [employer] asking them to please contact patient regarding this billing concern.

## 2020-06-18 NOTE — Telephone Encounter (Signed)
I have sent an email to [employer] asking them to please contact the patient regarding this billing concern.

## 2020-06-19 ENCOUNTER — Other Ambulatory Visit: Payer: Self-pay | Admitting: Family Medicine

## 2020-06-19 DIAGNOSIS — M25511 Pain in right shoulder: Secondary | ICD-10-CM

## 2020-06-19 DIAGNOSIS — G8929 Other chronic pain: Secondary | ICD-10-CM

## 2020-06-19 NOTE — Telephone Encounter (Signed)
Requesting: tramadol Contract:06/09/20 UDS:06/09/20 Last Visit:06/09/20 Next Visit:12/15/20 Last Refill:12/13/19  Please Advise

## 2020-06-28 ENCOUNTER — Other Ambulatory Visit: Payer: Self-pay | Admitting: Family Medicine

## 2020-06-28 DIAGNOSIS — G4700 Insomnia, unspecified: Secondary | ICD-10-CM

## 2020-06-30 ENCOUNTER — Other Ambulatory Visit: Payer: Self-pay | Admitting: Family Medicine

## 2020-06-30 DIAGNOSIS — I10 Essential (primary) hypertension: Secondary | ICD-10-CM

## 2020-06-30 NOTE — Telephone Encounter (Signed)
Last OV---06/09/2020 Last RF--#30 with 5 refills on 12/12/2019 CSC/UDS completed on 06/09/2020

## 2020-09-23 DIAGNOSIS — L281 Prurigo nodularis: Secondary | ICD-10-CM | POA: Diagnosis not present

## 2020-09-23 DIAGNOSIS — L821 Other seborrheic keratosis: Secondary | ICD-10-CM | POA: Diagnosis not present

## 2020-10-27 ENCOUNTER — Other Ambulatory Visit: Payer: Self-pay | Admitting: Family Medicine

## 2020-10-27 DIAGNOSIS — I10 Essential (primary) hypertension: Secondary | ICD-10-CM

## 2020-10-27 DIAGNOSIS — E039 Hypothyroidism, unspecified: Secondary | ICD-10-CM

## 2020-12-10 ENCOUNTER — Telehealth: Payer: Self-pay

## 2020-12-10 NOTE — Telephone Encounter (Signed)
caller has an upcoming appt but he cant remember the date / time.   Telephone: [phone]  Transaction Date/Time: 12/10/2020 7:22:10 AM (ET)

## 2020-12-10 NOTE — Telephone Encounter (Signed)
Upcoming 12/15/20 Monday at 7 AM Patient informed

## 2020-12-15 ENCOUNTER — Ambulatory Visit (INDEPENDENT_AMBULATORY_CARE_PROVIDER_SITE_OTHER): Payer: Medicare Other | Admitting: Family Medicine

## 2020-12-15 ENCOUNTER — Encounter: Payer: Self-pay | Admitting: Family Medicine

## 2020-12-15 ENCOUNTER — Other Ambulatory Visit: Payer: Self-pay

## 2020-12-15 VITALS — BP 112/70 | HR 59 | Temp 97.0°F | Ht 72.0 in | Wt 219.0 lb

## 2020-12-15 DIAGNOSIS — I10 Essential (primary) hypertension: Secondary | ICD-10-CM | POA: Diagnosis not present

## 2020-12-15 DIAGNOSIS — I25119 Atherosclerotic heart disease of native coronary artery with unspecified angina pectoris: Secondary | ICD-10-CM | POA: Diagnosis not present

## 2020-12-15 DIAGNOSIS — R7303 Prediabetes: Secondary | ICD-10-CM

## 2020-12-15 DIAGNOSIS — M25511 Pain in right shoulder: Secondary | ICD-10-CM

## 2020-12-15 DIAGNOSIS — M25512 Pain in left shoulder: Secondary | ICD-10-CM | POA: Diagnosis not present

## 2020-12-15 DIAGNOSIS — G4700 Insomnia, unspecified: Secondary | ICD-10-CM | POA: Diagnosis not present

## 2020-12-15 DIAGNOSIS — Z23 Encounter for immunization: Secondary | ICD-10-CM

## 2020-12-15 DIAGNOSIS — E039 Hypothyroidism, unspecified: Secondary | ICD-10-CM

## 2020-12-15 DIAGNOSIS — G8929 Other chronic pain: Secondary | ICD-10-CM | POA: Diagnosis not present

## 2020-12-15 LAB — LIPID PANEL
Cholesterol: 136 mg/dL (ref 0–200)
HDL: 41.7 mg/dL (ref >39.00)
LDL Cholesterol: 65 mg/dL (ref 0–99)
NonHDL: 94.22
Total CHOL/HDL Ratio: 3
Triglycerides: 146 mg/dL (ref 0.0–149.0)
VLDL: 29.2 mg/dL (ref 0.0–40.0)

## 2020-12-15 LAB — COMPREHENSIVE METABOLIC PANEL
ALT: 12 U/L (ref 0–53)
AST: 15 U/L (ref 0–37)
Albumin: 4.1 g/dL (ref 3.5–5.2)
Alkaline Phosphatase: 60 U/L (ref 39–117)
BUN: 19 mg/dL (ref 6–23)
CO2: 27 mEq/L (ref 19–32)
Calcium: 9.3 mg/dL (ref 8.4–10.5)
Chloride: 103 mEq/L (ref 96–112)
Creatinine, Ser: 0.87 mg/dL (ref 0.40–1.50)
GFR: 83.04 mL/min (ref >60.00)
Glucose, Bld: 84 mg/dL (ref 70–99)
Potassium: 4 mEq/L (ref 3.5–5.1)
Sodium: 139 mEq/L (ref 135–145)
Total Bilirubin: 0.5 mg/dL (ref 0.2–1.2)
Total Protein: 7.2 g/dL (ref 6.0–8.3)

## 2020-12-15 LAB — T4, FREE: Free T4: 1.12 ng/dL (ref 0.60–1.60)

## 2020-12-15 LAB — HEMOGLOBIN A1C: Hgb A1c MFr Bld: 6 % (ref 4.6–6.5)

## 2020-12-15 LAB — TSH: TSH: 0.54 u[IU]/mL (ref 0.35–5.50)

## 2020-12-15 MED ORDER — ZOLPIDEM TARTRATE 5 MG PO TABS: 5.0000 mg | ORAL_TABLET | Freq: Every evening | ORAL | 5 refills | Status: DC | PRN

## 2020-12-15 MED ORDER — MONTELUKAST SODIUM 10 MG PO TABS: 10.0000 mg | ORAL_TABLET | Freq: Every day | ORAL | 3 refills | Status: DC

## 2020-12-15 MED ORDER — TRAMADOL HCL 50 MG PO TABS: ORAL_TABLET | ORAL | 1 refills | Status: DC

## 2020-12-15 NOTE — Progress Notes (Signed)
Chief Complaint  Patient presents with   Follow-up   Constipation    Allergies     Subjective Jeffrey Rhodes is a 80 y.o. male who presents for hypertension follow up. He does not monitor home blood pressures. He is compliant with medications- Norvasc 10 mg/d, Toprol XL 25 mg/d. Patient has these side effects of medication: none He is adhering to a healthy diet overall. Current exercise: walking, wt resistance exercises No Cp or SOB.   Hypothyroidism Patient presents for follow-up of hypothyroidism.  Reports compliance with medication- levothyroxine 137 mcg/d. Current symptoms include: denies fatigue, weight changes, heat/cold intolerance, bowel/skin changes or CVS symptoms He believes his dose should be unchanged  Insomnia Taking Ambien 5 mg nightly. No AE's. Mood stable. Saw counseling team in past and was able to wean down to 5 mg/d from 10 mg/d.    Past Medical History:  Diagnosis Date   Allergy    Arthritis    Coronary artery disease    High cholesterol    Hypertension    Hypothyroid     Exam BP 112/70   Pulse (!) 59   Temp (!) 97 F (36.1 C) (Oral)   Ht 6' (1.829 m)   Wt 219 lb (99.3 kg)   SpO2 98%   BMI 29.70 kg/m  General:  well developed, well nourished, in no apparent distress Neck: Supple, symmetric, no thyromegaly  Heart: Reg rhythm, bradycardic, no bruits, no LE edema Lungs: clear to auscultation, no accessory muscle use Psych: well oriented with normal range of affect and appropriate judgment/insight  Essential hypertension, benign  Hypothyroidism, unspecified type - Plan: TSH, T4, free  Insomnia, unspecified type - Plan: zolpidem (AMBIEN) 5 MG tablet  Prediabetes - Plan: Hemoglobin A1c  Coronary artery disease involving native coronary artery of native heart with angina pectoris (HCC) - Plan: Comprehensive metabolic panel, Lipid panel  Chronic pain of both shoulders - Plan: traMADol (ULTRAM) 50 MG tablet  Chronic, stable. Cont Norvasc 10  mg/d, Toprol XL 25 mg/d. Counseled on diet and exercise. Chronic, stable. Cont Synthroid 137 mcg/d.  Chronic, stable. Cont Ambien 5 mg qhs prn. UDS UTD.  Covid vaccine booster rec'd. Flu shot and PCV20 today.  F/u in 6 mo or prn. The patient voiced understanding and agreement to the plan.  [place], DO 12/15/20  7:11 AM

## 2020-12-15 NOTE — Patient Instructions (Addendum)
Good seeing you.  Give [place] 2-3 business days to get the results of your labs back.   Keep the diet clean and stay active.  Use the Singulair/montelukast as needed.   I recommend getting the updated bivalent covid vaccination booster at your convenience.   Let us know if you need anything.

## 2020-12-15 NOTE — Addendum Note (Signed)
Addended by: Sharon Seller B on: 12/15/2020 07:28 AM   Modules accepted: Orders

## 2020-12-16 NOTE — Progress Notes (Signed)
HPI: Follow-up coronary artery disease. [place] he was [place] with chest pain and ruled out. His symptoms were felt to be atypical. Nuclear study showed an ejection fraction of 58%. No infarct or ischemia. Abd ultrasound 9/19 showed no AAA.  Echocardiogram November 2020 showed normal LV function, moderate left atrial enlargement.  Since last seen he denies dyspnea, chest pain, palpitations or syncope.  Current Outpatient Medications  Medication Sig Dispense Refill   acetaminophen (TYLENOL) 500 MG tablet Take 500 mg by mouth every 8 (eight) hours as needed for mild pain.      amLODipine (NORVASC) 10 MG tablet TAKE 1 TABLET BY MOUTH EVERY DAY 90 tablet 2   amoxicillin-clavulanate (AUGMENTIN) 875-125 MG tablet Take 1 tablet by mouth 2 (two) times daily.     clobetasol cream (TEMOVATE) 0.05 % Apply topically.     fluticasone (FLONASE) 50 MCG/ACT nasal spray Place 2 sprays into both nostrils daily as needed for allergies or rhinitis. 48 mL 3   latanoprost (XALATAN) 0.005 % ophthalmic solution Place 1 drop into both eyes at bedtime.     LIVALO 4 MG TABS TAKE 1 TABLET BY MOUTH EVERY DAY 90 tablet 2   metoprolol succinate (TOPROL-XL) 25 MG 24 hr tablet TAKE 1 TABLET (25 MG TOTAL) BY MOUTH DAILY. 90 tablet 1   montelukast (SINGULAIR) 10 MG tablet Take 1 tablet (10 mg total) by mouth at bedtime. 30 tablet 3   OVER THE COUNTER MEDICATION Super Beta Prostate     SYNTHROID 137 MCG tablet TAKE 1 TABLET BY MOUTH DAILY BEFORE BREAKFAST. 90 tablet 1   tamsulosin (FLOMAX) 0.4 MG CAPS capsule Take 1 capsule (0.4 mg total) by mouth daily. 90 capsule 3   traMADol (ULTRAM) 50 MG tablet Take nightly as needed for pain. 90 tablet 1   zolpidem (AMBIEN) 5 MG tablet Take 1 tablet (5 mg total) by mouth at bedtime as needed. for sleep 30 tablet 5   No current facility-administered medications for this visit.     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  Tobacco Use   Smoking status: Former    Types: Cigarettes   Smokeless tobacco: Never   Tobacco comments:    2003 quit  Vaping Use   Vaping Use: Never used  Substance and Sexual Activity   Alcohol use: Yes    Alcohol/week: 0.0 standard drinks    Comment: occasional   Drug use: No   Sexual activity: Not on file  Other Topics Concern   Not on file  Social History Narrative   Not on file   Social Determinants of Health   Financial Resource Strain: Not on file  Food Insecurity: Not on file  Transportation Needs: Not on file  Physical Activity: Not on file  Stress: Not on  file  Social Connections: Not on file  Intimate Partner Violence: Not on file    Family History  Problem Relation Age of Onset   Breast cancer Mother    Diabetes Brother    Heart disease Brother    Colon cancer Neg Hx     ROS: no fevers or chills, productive cough, hemoptysis, dysphasia, odynophagia, melena, hematochezia, dysuria, hematuria, rash, seizure activity, orthopnea, PND, pedal edema, claudication. Remaining systems are negative.  Physical Exam: Well-developed well-nourished in no acute distress.  Skin is warm and dry.  HEENT is normal.  Neck is supple.  Chest is clear to auscultation with normal expansion.  Cardiovascular exam is regular rate and rhythm.  2/6 systolic murmur left sternal border.  S2 is not diminished. Abdominal exam nontender or distended. No masses  palpated. Extremities show no edema. neuro grossly intact  ECG-normal sinus rhythm at a rate of 65, right bundle branch block, inferior infarct.  Personally reviewed  A/P  1 coronary artery disease-patient denies chest pain.  Continue aspirin and statin.  2 hypertension-blood pressure controlled.  Continue meds and follow.  3 hyperlipidemia-Livalo has become too expensive.  We will discontinue and begin Crestor 40 mg daily.  Check lipids and liver in 8 weeks.  4 aortic stenosis murmur-patient with murmur on examination.  Echocardiogram 2 years ago showed no AAS.  He will likely need repeat echocardiogram when he returns in 1 year.  Note S2 is not diminished.  Kirk Ruths, MD

## 2020-12-20 DIAGNOSIS — H6123 Impacted cerumen, bilateral: Secondary | ICD-10-CM | POA: Diagnosis not present

## 2020-12-20 DIAGNOSIS — J019 Acute sinusitis, unspecified: Secondary | ICD-10-CM | POA: Diagnosis not present

## 2020-12-24 ENCOUNTER — Other Ambulatory Visit: Payer: Self-pay

## 2020-12-24 ENCOUNTER — Encounter: Payer: Self-pay | Admitting: Cardiology

## 2020-12-24 ENCOUNTER — Ambulatory Visit (INDEPENDENT_AMBULATORY_CARE_PROVIDER_SITE_OTHER): Payer: Medicare Other | Admitting: Cardiology

## 2020-12-24 VITALS — BP 132/62 | HR 65 | Ht 72.0 in | Wt 216.1 lb

## 2020-12-24 DIAGNOSIS — I25810 Atherosclerosis of coronary artery bypass graft(s) without angina pectoris: Secondary | ICD-10-CM

## 2020-12-24 DIAGNOSIS — I10 Essential (primary) hypertension: Secondary | ICD-10-CM | POA: Diagnosis not present

## 2020-12-24 DIAGNOSIS — Z951 Presence of aortocoronary bypass graft: Secondary | ICD-10-CM

## 2020-12-24 DIAGNOSIS — R011 Cardiac murmur, unspecified: Secondary | ICD-10-CM | POA: Diagnosis not present

## 2020-12-24 DIAGNOSIS — E785 Hyperlipidemia, unspecified: Secondary | ICD-10-CM

## 2020-12-24 MED ORDER — ROSUVASTATIN CALCIUM 40 MG PO TABS: 40.0000 mg | ORAL_TABLET | Freq: Every day | ORAL | 3 refills | Status: DC

## 2020-12-24 NOTE — Patient Instructions (Signed)
Medication Instructions:   STOP LIVALO  START ROSUVASTATIN 40 MG ONCE DAILY  *If you need a refill on your cardiac medications before your next appointment, please call your pharmacy*   Lab Work:  Your physician recommends that you return for lab work in: [place]  If you have labs (blood work) drawn today and your tests are completely normal, you will receive your results only by: [place] (if you have MyChart) OR A paper copy in the mail If you have any lab test that is abnormal or we need to change your treatment, we will call you to review the results.   Follow-Up: At [facility], you and your health needs are our priority.  As part of our continuing mission to provide you with exceptional heart care, we have created designated Provider Care Teams.  These Care Teams include your primary Cardiologist (physician) and Advanced Practice Providers (APPs -  Physician Assistants and Nurse Practitioners) who all work together to provide you with the care you need, when you need it.  We recommend signing up for the patient portal called "MyChart".  Sign up information is provided on this After Visit Summary.  MyChart is used to connect with patients for Virtual Visits (Telemedicine).  Patients are able to view lab/test results, encounter notes, upcoming appointments, etc.  Non-urgent messages can be sent to your provider as well.   To learn more about what you can do with MyChart, go to [email or web address].    Your next appointment:   12 month(s)  The format for your next appointment:   In Person  Provider:   Kirk Ruths, MD

## 2021-02-18 DIAGNOSIS — E785 Hyperlipidemia, unspecified: Secondary | ICD-10-CM | POA: Diagnosis not present

## 2021-02-19 LAB — HEPATIC FUNCTION PANEL
ALT: 13 IU/L (ref 0–44)
AST: 13 IU/L (ref 0–40)
Albumin: 4.2 g/dL (ref 3.7–4.7)
Alkaline Phosphatase: 71 IU/L (ref 44–121)
Bilirubin Total: 0.4 mg/dL (ref 0.0–1.2)
Bilirubin, Direct: 0.11 mg/dL (ref 0.00–0.40)
Total Protein: 7.5 g/dL (ref 6.0–8.5)

## 2021-02-19 LAB — LIPID PANEL
Chol/HDL Ratio: 3.2 ratio (ref 0.0–5.0)
Cholesterol, Total: 139 mg/dL (ref 100–199)
HDL: 44 mg/dL (ref >39)
LDL Chol Calc (NIH): 68 mg/dL (ref 0–99)
Triglycerides: 157 mg/dL — ABNORMAL HIGH (ref 0–149)
VLDL Cholesterol Cal: 27 mg/dL (ref 5–40)

## 2021-02-20 DIAGNOSIS — L03811 Cellulitis of head [any part, except face]: Secondary | ICD-10-CM | POA: Diagnosis not present

## 2021-02-23 DIAGNOSIS — L723 Sebaceous cyst: Secondary | ICD-10-CM | POA: Diagnosis not present

## 2021-02-23 DIAGNOSIS — L0291 Cutaneous abscess, unspecified: Secondary | ICD-10-CM | POA: Diagnosis not present

## 2021-02-26 DIAGNOSIS — L0291 Cutaneous abscess, unspecified: Secondary | ICD-10-CM | POA: Diagnosis not present

## 2021-02-26 DIAGNOSIS — L723 Sebaceous cyst: Secondary | ICD-10-CM | POA: Diagnosis not present

## 2021-03-12 ENCOUNTER — Other Ambulatory Visit: Payer: Self-pay | Admitting: Family Medicine

## 2021-03-18 ENCOUNTER — Other Ambulatory Visit: Payer: Self-pay | Admitting: Family Medicine

## 2021-03-18 DIAGNOSIS — I10 Essential (primary) hypertension: Secondary | ICD-10-CM

## 2021-03-18 DIAGNOSIS — E039 Hypothyroidism, unspecified: Secondary | ICD-10-CM

## 2021-06-15 ENCOUNTER — Ambulatory Visit (INDEPENDENT_AMBULATORY_CARE_PROVIDER_SITE_OTHER): Payer: Medicare Other | Admitting: Family Medicine

## 2021-06-15 ENCOUNTER — Encounter: Payer: Self-pay | Admitting: Family Medicine

## 2021-06-15 VITALS — BP 122/70 | HR 64 | Temp 98.0°F | Ht 72.0 in | Wt 217.1 lb

## 2021-06-15 DIAGNOSIS — M25511 Pain in right shoulder: Secondary | ICD-10-CM | POA: Diagnosis not present

## 2021-06-15 DIAGNOSIS — G4700 Insomnia, unspecified: Secondary | ICD-10-CM

## 2021-06-15 DIAGNOSIS — N400 Enlarged prostate without lower urinary tract symptoms: Secondary | ICD-10-CM | POA: Diagnosis not present

## 2021-06-15 DIAGNOSIS — E785 Hyperlipidemia, unspecified: Secondary | ICD-10-CM

## 2021-06-15 DIAGNOSIS — I714 Abdominal aortic aneurysm, without rupture, unspecified: Secondary | ICD-10-CM | POA: Diagnosis not present

## 2021-06-15 DIAGNOSIS — G8929 Other chronic pain: Secondary | ICD-10-CM | POA: Diagnosis not present

## 2021-06-15 DIAGNOSIS — Z79899 Other long term (current) drug therapy: Secondary | ICD-10-CM | POA: Diagnosis not present

## 2021-06-15 DIAGNOSIS — E039 Hypothyroidism, unspecified: Secondary | ICD-10-CM | POA: Diagnosis not present

## 2021-06-15 DIAGNOSIS — I10 Essential (primary) hypertension: Secondary | ICD-10-CM

## 2021-06-15 DIAGNOSIS — M25512 Pain in left shoulder: Secondary | ICD-10-CM

## 2021-06-15 DIAGNOSIS — I25119 Atherosclerotic heart disease of native coronary artery with unspecified angina pectoris: Secondary | ICD-10-CM | POA: Diagnosis not present

## 2021-06-15 LAB — COMPREHENSIVE METABOLIC PANEL
ALT: 15 U/L (ref 0–53)
AST: 16 U/L (ref 0–37)
Albumin: 4.2 g/dL (ref 3.5–5.2)
Alkaline Phosphatase: 65 U/L (ref 39–117)
BUN: 17 mg/dL (ref 6–23)
CO2: 28 mEq/L (ref 19–32)
Calcium: 9.1 mg/dL (ref 8.4–10.5)
Chloride: 102 mEq/L (ref 96–112)
Creatinine, Ser: 0.93 mg/dL (ref 0.40–1.50)
GFR: 78.75 mL/min (ref >60.00)
Glucose, Bld: 113 mg/dL — ABNORMAL HIGH (ref 70–99)
Potassium: 4.5 mEq/L (ref 3.5–5.1)
Sodium: 138 mEq/L (ref 135–145)
Total Bilirubin: 0.5 mg/dL (ref 0.2–1.2)
Total Protein: 7.7 g/dL (ref 6.0–8.3)

## 2021-06-15 LAB — LIPID PANEL
Cholesterol: 126 mg/dL (ref 0–200)
HDL: 37.9 mg/dL — ABNORMAL LOW (ref >39.00)
LDL Cholesterol: 57 mg/dL (ref 0–99)
NonHDL: 87.84
Total CHOL/HDL Ratio: 3
Triglycerides: 154 mg/dL — ABNORMAL HIGH (ref 0.0–149.0)
VLDL: 30.8 mg/dL (ref 0.0–40.0)

## 2021-06-15 LAB — TSH: TSH: 1.54 u[IU]/mL (ref 0.35–5.50)

## 2021-06-15 LAB — T4, FREE: Free T4: 0.96 ng/dL (ref 0.60–1.60)

## 2021-06-15 MED ORDER — TRAMADOL HCL 50 MG PO TABS: ORAL_TABLET | ORAL | 1 refills | Status: DC

## 2021-06-15 MED ORDER — ZOLPIDEM TARTRATE 5 MG PO TABS: 5.0000 mg | ORAL_TABLET | Freq: Every evening | ORAL | 5 refills | Status: DC | PRN

## 2021-06-15 MED ORDER — NORTRIPTYLINE HCL 10 MG PO CAPS: 10.0000 mg | ORAL_CAPSULE | Freq: Every day | ORAL | 0 refills | Status: DC

## 2021-06-15 NOTE — Progress Notes (Signed)
Chief Complaint  ?Patient presents with  ? Follow-up  ? Gas  ?  BM change ?Constipation for 10 days  ? ? ?Subjective ?Jeffrey Rhodes is a 81 y.o. male who presents for hypertension follow up. ?He does not monitor home blood pressures. ?He is compliant with medications- Norvasc 10 mg/d, Toprol XL 50 mg/d. ?Patient has these side effects of medication: none ?He is sometimes adhering to a healthy diet overall. ?Current exercise: walking, stretches, active in yard ?No Cp or SOB.  ? ?Hypothyroidism ?Patient presents for follow-up of hypothyroidism.  ?Reports compliance with medication- Synthroid 137 mcg/d. Marland Kitchen ?Current symptoms include: denies fatigue, weight changes, heat/cold intolerance, bowel/skin changes or CVS symptoms ?He believes his dose should be unchanged ? ?Dyslipidemia ?Patient presents for dyslipidemia follow up. ?Currently being treated with Crestor 40 mg/d and compliance with treatment thus far has been good. ?He denies myalgias. ?Diet/exercise as above.  ?The patient is known to have coexisting coronary artery disease. ? ?Insomnia- doing OK on Ambien 5 mg/d. Was on 10 mg/d prior to seeing me, went to CBT and weaned down. No AE's, usually takes nightly. Still not sleeping as well as he was prior. Failed Doxepin, Remeron, trazodone. Could not afford Ramelteon or Belsomra.  ? ?BPH- Was having nocturia. Started on Flomax 0.4 mg/d. Reports compliance, no AE's.  ?  ?Past Medical History:  ?Diagnosis Date  ? Allergy   ? Arthritis   ? Coronary artery disease   ? High cholesterol   ? Hypertension   ? Hypothyroid   ? ? ?Exam ?BP 122/70   Pulse 64   Temp 98 ?F (36.7 ?C) (Oral)   Ht 6' (1.829 m)   Wt 217 lb 2 oz (98.5 kg)   SpO2 97%   BMI 29.45 kg/m?  ?General:  well developed, well nourished, in no apparent distress ?Heart: RRR, no bruits, no LE edema ?Lungs: clear to auscultation, no accessory muscle use ?Abd: BS+, S, NT, ND ?Psych: well oriented with normal range of affect and appropriate  judgment/insight ? ?Essential hypertension, benign ? ?Hypothyroidism, unspecified type - Plan: TSH, T4, free ? ?Insomnia, unspecified type - Plan: zolpidem (AMBIEN) 5 MG tablet ? ?Benign prostatic hyperplasia, unspecified whether lower urinary tract symptoms present ? ?Encounter for long-term (current) use of high-risk medication - Plan: Drug Monitoring Panel [phone] , Urine ? ?Dyslipidemia - Plan: Comprehensive metabolic panel, Lipid panel ? ?Abdominal aortic aneurysm (AAA) without rupture, unspecified part ([place]), Chronic ? ?Coronary artery disease involving native coronary artery of native heart with angina pectoris ([place]), Chronic ? ?Chronic pain of both shoulders - Plan: traMADol (ULTRAM) 50 MG tablet ? ?Chronic, stable. Cont Norvasc 10 mg/d, Toprol XL 50 mg/d ?Chronic, stable. Cont levothyroxine 137 mcg/d ?Chronic, unstable. Cont Ambien 5 mg qhs prn. Add Pamelor 10 mg qhs. F/u in 1 mo to reck this. ?Chronic, stable. Cont Flomax 0.4 mg/d.  ?Ck UDS. ?Chronic, stable. Cont Crestor 40 mg/d.  ?The patient voiced understanding and agreement to the plan. ? ?Shelda Pal, DO ?06/15/21  ?8:05 AM ? ?

## 2021-06-15 NOTE — Patient Instructions (Addendum)
Give [place] 2-3 business days to get the results of your labs back.  ? ?Keep the diet clean and stay active. ? ?Try MiraLAX 1-2 times daily over the next 3-4 days. If no improvement, try using an enema. Stay well hydrated and keep lots of fiber in your diet. ? ?Foods that may reduce pain: ?1) Ginger ?2) Blueberries ?3) Salmon ?4) Pumpkin seeds ?5) dark chocolate ?6) turmeric ?7) tart cherries ?8) virgin olive oil ?9) chilli peppers ?10) mint ?11) krill oil ? ?Let us know if you need anything. ?

## 2021-06-17 LAB — DRUG MONITORING PANEL 376104, URINE
Amphetamines: NEGATIVE ng/mL (ref <500)
Barbiturates: NEGATIVE ng/mL (ref <300)
Benzodiazepines: NEGATIVE ng/mL (ref <100)
Cocaine Metabolite: NEGATIVE ng/mL (ref <150)
Desmethyltramadol: 426 ng/mL — ABNORMAL HIGH (ref <100)
Opiates: NEGATIVE ng/mL (ref <100)
Oxycodone: NEGATIVE ng/mL (ref <100)
Tramadol: >10000 ng/mL — ABNORMAL HIGH (ref <100)

## 2021-06-17 LAB — DM TEMPLATE

## 2021-06-19 ENCOUNTER — Other Ambulatory Visit: Payer: Self-pay | Admitting: Cardiology

## 2021-06-19 DIAGNOSIS — I25810 Atherosclerosis of coronary artery bypass graft(s) without angina pectoris: Secondary | ICD-10-CM

## 2021-06-19 NOTE — Telephone Encounter (Signed)
The original prescription was discontinued on 12/24/2020 by Cristopher Estimable, RN for the following reason: Cost of medication ?

## 2021-07-07 ENCOUNTER — Other Ambulatory Visit: Payer: Self-pay | Admitting: Family Medicine

## 2021-07-13 ENCOUNTER — Encounter: Payer: Self-pay | Admitting: Family Medicine

## 2021-07-13 ENCOUNTER — Ambulatory Visit (INDEPENDENT_AMBULATORY_CARE_PROVIDER_SITE_OTHER): Payer: Medicare Other | Admitting: Family Medicine

## 2021-07-13 VITALS — BP 118/62 | HR 69 | Temp 97.8°F | Ht 72.0 in | Wt 215.1 lb

## 2021-07-13 DIAGNOSIS — M25512 Pain in left shoulder: Secondary | ICD-10-CM | POA: Diagnosis not present

## 2021-07-13 DIAGNOSIS — G8929 Other chronic pain: Secondary | ICD-10-CM

## 2021-07-13 DIAGNOSIS — G4700 Insomnia, unspecified: Secondary | ICD-10-CM | POA: Diagnosis not present

## 2021-07-13 DIAGNOSIS — M25511 Pain in right shoulder: Secondary | ICD-10-CM | POA: Diagnosis not present

## 2021-07-13 MED ORDER — MELOXICAM 15 MG PO TABS: 15.0000 mg | ORAL_TABLET | Freq: Every day | ORAL | 3 refills | Status: DC

## 2021-07-13 MED ORDER — ZOLPIDEM TARTRATE ER 6.25 MG PO TBCR: 6.2500 mg | EXTENDED_RELEASE_TABLET | Freq: Every evening | ORAL | 0 refills | Status: DC | PRN

## 2021-07-13 NOTE — Patient Instructions (Addendum)
Don't fill the medicine if it is too expensive.  Stay active.  Please call Dr. Blanch Media office regarding your follow up colonoscopy: [phone]  Let us know if you need anything.

## 2021-07-13 NOTE — Progress Notes (Signed)
Chief Complaint  Patient presents with   Follow-up    1 month He restarted meloxicam about a week ago and is helping.  Send in refill please.    Subjective: Patient is a 81 y.o. male here for f/u.  Pt had been struggling w sleep at his 6 mo ck last mo. He has been taking Ambien 5 mg qhs without AE. When we first met, he went thru counseling/CBT and was able to wean down from 10 mg qhs. This had been less efficacious as of late. He was started on Pamelor 10 mg qhs last mo. He is still not sleeping well. Reports compliance, no AE's. Belsomra and Ramelteon were too expensive. He failed low dose Doxepin as well as trazodone.    Chronic shoulder pain Hx of OA in both shoulders, worse on R. No recent inj or change in activity. +famhx of OA, will have random flares. Sometimes will take Mobic that helps during flares. Does not take daily.   Past Medical History:  Diagnosis Date   Allergy    Arthritis    Coronary artery disease    High cholesterol    Hypertension    Hypothyroid     Objective: BP 118/62   Pulse 69   Temp 97.8 F (36.6 C) (Oral)   Ht 6' (1.829 m)   Wt 215 lb 2 oz (97.6 kg)   SpO2 96%   BMI 29.18 kg/m  General: Awake, appears stated age Lungs:  No accessory muscle use Psych: Age appropriate judgment and insight, normal affect and mood  Assessment and Plan: Insomnia, unspecified type - Plan: zolpidem (AMBIEN CR) 6.25 MG CR tablet  Chronic pain of both shoulders  Chronic, unstable. Try longer active Ambien 6.25 mg qhs. He will let me know if too expensive.  Chronic, stable. Cont Mobic 15 mg qd prn. F/u in 1 mo for med ck.  The patient voiced understanding and agreement to the plan.  [place], DO 07/13/21  7:19 AM

## 2021-08-07 ENCOUNTER — Encounter: Payer: Self-pay | Admitting: Internal Medicine

## 2021-08-14 ENCOUNTER — Telehealth: Payer: Self-pay | Admitting: Family Medicine

## 2021-08-14 DIAGNOSIS — G4700 Insomnia, unspecified: Secondary | ICD-10-CM

## 2021-08-24 ENCOUNTER — Encounter: Payer: Self-pay | Admitting: Family Medicine

## 2021-08-24 ENCOUNTER — Ambulatory Visit (INDEPENDENT_AMBULATORY_CARE_PROVIDER_SITE_OTHER): Payer: Medicare Other | Admitting: Family Medicine

## 2021-08-24 VITALS — BP 128/68 | HR 65 | Temp 98.0°F | Ht 72.0 in | Wt 219.0 lb

## 2021-08-24 DIAGNOSIS — G4700 Insomnia, unspecified: Secondary | ICD-10-CM

## 2021-08-24 MED ORDER — PRAZOSIN HCL 2 MG PO CAPS: 2.0000 mg | ORAL_CAPSULE | Freq: Every day | ORAL | 1 refills | Status: DC

## 2021-08-24 NOTE — Patient Instructions (Signed)
No exercise within 3 hours of planned bedtime.  This new medicine should be affordable, if it is not, please let me know.   Send me a message in 2 weeks with how you are doing.   Let us know if you need anything.

## 2021-08-24 NOTE — Progress Notes (Signed)
Chief Complaint  Patient presents with   Follow-up    1 month     Subjective: Patient is a 81 y.o. male here for f/u insomnia.  Changed Ambien 5 mg qhs to 6.25 mg extended release. Reports since that time it helped a little but is still having difficulty falling asleep. He will sleep OK from 12-4 and then be wide awake, does not feel well rested. No AE's, reports compliance. Of note, he does do some core strengthening exercises in bed around 30 minutes before trying to sleep.   Past Medical History:  Diagnosis Date   Allergy    Arthritis    Coronary artery disease    High cholesterol    Hypertension    Hypothyroid     Objective: BP 128/68   Pulse 65   Temp 98 F (36.7 C) (Oral)   Ht 6' (1.829 m)   Wt 219 lb (99.3 kg)   SpO2 96%   BMI 29.70 kg/m  General: Awake, appears stated age Lungs: No accessory muscle use Psych: Age appropriate judgment and insight, normal affect and mood  Assessment and Plan: Insomnia, unspecified type - Plan: prazosin (MINIPRESS) 2 MG capsule  Chronic, not stable. Stop Ambien. Start prazosin 2 mg qhs. Send message in 2 weeks and we could try Lunesta or Sonata. He has been thru CBT. He will stop exercising within 3 hrs of bedtime. F/u in 1 mo.  The patient voiced understanding and agreement to the plan.  [place], DO 08/24/21  7:21 AM

## 2021-08-26 ENCOUNTER — Encounter: Payer: Medicare Other | Admitting: Internal Medicine

## 2021-09-05 DIAGNOSIS — H40029 Open angle with borderline findings, high risk, unspecified eye: Secondary | ICD-10-CM | POA: Diagnosis not present

## 2021-09-08 ENCOUNTER — Telehealth: Payer: Self-pay | Admitting: Family Medicine

## 2021-09-08 MED ORDER — ZALEPLON 5 MG PO CAPS: 5.0000 mg | ORAL_CAPSULE | Freq: Every evening | ORAL | 0 refills | Status: DC | PRN

## 2021-09-08 NOTE — Telephone Encounter (Signed)
Patient informed. 

## 2021-09-08 NOTE — Telephone Encounter (Signed)
Stop that. New med sent. Ty.

## 2021-09-08 NOTE — Telephone Encounter (Signed)
Pt called stating that the prazosin that he was prescribed a couple weeks ago has not helped. Pt stated Dr. Nani Ravens told him to call back if he was not seeing any improvement.

## 2021-09-09 ENCOUNTER — Other Ambulatory Visit: Payer: Self-pay | Admitting: Family Medicine

## 2021-09-12 DIAGNOSIS — H40029 Open angle with borderline findings, high risk, unspecified eye: Secondary | ICD-10-CM | POA: Diagnosis not present

## 2021-09-15 ENCOUNTER — Other Ambulatory Visit: Payer: Self-pay | Admitting: Family Medicine

## 2021-09-15 DIAGNOSIS — G4700 Insomnia, unspecified: Secondary | ICD-10-CM

## 2021-09-16 ENCOUNTER — Other Ambulatory Visit: Payer: Self-pay | Admitting: Internal Medicine

## 2021-09-16 DIAGNOSIS — G4700 Insomnia, unspecified: Secondary | ICD-10-CM

## 2021-09-28 ENCOUNTER — Other Ambulatory Visit: Payer: Self-pay | Admitting: Family Medicine

## 2021-09-28 ENCOUNTER — Ambulatory Visit (INDEPENDENT_AMBULATORY_CARE_PROVIDER_SITE_OTHER): Payer: Medicare Other | Admitting: Family Medicine

## 2021-09-28 ENCOUNTER — Encounter: Payer: Self-pay | Admitting: Family Medicine

## 2021-09-28 VITALS — BP 118/72 | HR 69 | Temp 98.0°F | Ht 72.0 in | Wt 216.2 lb

## 2021-09-28 DIAGNOSIS — E039 Hypothyroidism, unspecified: Secondary | ICD-10-CM

## 2021-09-28 DIAGNOSIS — I10 Essential (primary) hypertension: Secondary | ICD-10-CM

## 2021-09-28 DIAGNOSIS — G4700 Insomnia, unspecified: Secondary | ICD-10-CM | POA: Diagnosis not present

## 2021-09-28 DIAGNOSIS — G8929 Other chronic pain: Secondary | ICD-10-CM

## 2021-09-28 MED ORDER — LEVOTHYROXINE SODIUM 137 MCG PO TABS: ORAL_TABLET | ORAL | 1 refills | Status: DC

## 2021-09-28 MED ORDER — MELOXICAM 15 MG PO TABS: 15.0000 mg | ORAL_TABLET | Freq: Every day | ORAL | 0 refills | Status: DC

## 2021-09-28 MED ORDER — METOPROLOL SUCCINATE ER 25 MG PO TB24: 25.0000 mg | ORAL_TABLET | Freq: Every day | ORAL | 1 refills | Status: DC

## 2021-09-28 MED ORDER — ZOLPIDEM TARTRATE 10 MG PO TABS: 10.0000 mg | ORAL_TABLET | Freq: Every evening | ORAL | 1 refills | Status: DC | PRN

## 2021-09-28 MED ORDER — TAMSULOSIN HCL 0.4 MG PO CAPS: 0.4000 mg | ORAL_CAPSULE | Freq: Every day | ORAL | 3 refills | Status: DC

## 2021-09-28 NOTE — Patient Instructions (Addendum)
Let me know if there are cost issues.  Stop the Sonata.  Thanks for trying to stay on a lower dosage of Ambien.  Call Dr. Henrene Pastor to discuss if you need another colonoscopy.   I recommend getting the flu shot in mid October. This suggestion would change if the CDC comes out with a different recommendation.   Let us know if you need anything.

## 2021-09-28 NOTE — Progress Notes (Signed)
Chief Complaint  Patient presents with   Follow-up    Subjective: Patient is a 81 y.o. male here for insomnia f/u.  Ambien stopped working as well. Trialed on Pamelor, prazosin, Ambien CR and has failed trazodone and Belsomra in the past. He was recently started on Sonata 5 mg qhs. He had some left over Ambien 5 mg tabs and took 2 of them which worked well. He had no AE's.   Past Medical History:  Diagnosis Date   Allergy    Arthritis    Coronary artery disease    High cholesterol    Hypertension    Hypothyroid     Objective: BP 118/72   Pulse 69   Temp 98 F (36.7 C) (Oral)   Ht 6' (1.829 m)   Wt 216 lb 4 oz (98.1 kg)   SpO2 93%   BMI 29.33 kg/m  General: Awake, appears stated age Lungs: No accessory muscle use Psych: Age appropriate judgment and insight, normal affect and mood  Assessment and Plan: Insomnia, unspecified type - Plan: zolpidem (AMBIEN) 10 MG tablet  Chronic, currently uncontrolled. Stop Sonata. Restart Ambien but at 10 mg qhs instead of 5 mg qhs. F/u in 3 mo for med ck or prn.  The patient voiced understanding and agreement to the plan.  [place], DO 09/28/21  7:16 AM

## 2021-09-29 MED ORDER — TRAMADOL HCL 50 MG PO TABS: ORAL_TABLET | ORAL | 1 refills | Status: DC

## 2021-09-30 ENCOUNTER — Other Ambulatory Visit: Payer: Self-pay | Admitting: Family Medicine

## 2021-09-30 DIAGNOSIS — I10 Essential (primary) hypertension: Secondary | ICD-10-CM

## 2021-09-30 DIAGNOSIS — E785 Hyperlipidemia, unspecified: Secondary | ICD-10-CM

## 2021-09-30 MED ORDER — ROSUVASTATIN CALCIUM 40 MG PO TABS: 40.0000 mg | ORAL_TABLET | Freq: Every day | ORAL | 2 refills | Status: DC

## 2021-09-30 MED ORDER — AMLODIPINE BESYLATE 10 MG PO TABS: 10.0000 mg | ORAL_TABLET | Freq: Every day | ORAL | 2 refills | Status: DC

## 2021-10-18 ENCOUNTER — Other Ambulatory Visit: Payer: Self-pay | Admitting: Family Medicine

## 2021-10-18 DIAGNOSIS — I10 Essential (primary) hypertension: Secondary | ICD-10-CM

## 2021-11-10 ENCOUNTER — Other Ambulatory Visit: Payer: Self-pay | Admitting: Family Medicine

## 2021-11-10 DIAGNOSIS — G8929 Other chronic pain: Secondary | ICD-10-CM

## 2021-11-11 NOTE — Telephone Encounter (Signed)
Requesting: Tramadol Contract: 06/15/2021 UDS: 06/15/2021 Last OV: 09/28/2021 Next OV: 12/30/2021 Last Refill: 09/29/2021, #90--1 RF Database:   Please advise

## 2021-12-05 ENCOUNTER — Other Ambulatory Visit: Payer: Self-pay | Admitting: Family Medicine

## 2021-12-05 DIAGNOSIS — G4700 Insomnia, unspecified: Secondary | ICD-10-CM

## 2021-12-07 NOTE — Telephone Encounter (Signed)
Last RF--09/28/21--#90 with 1 refills  Last OV--09/28/21

## 2021-12-22 NOTE — Progress Notes (Deleted)
HPI: Follow-up coronary artery disease. [place] In [place] 2017 he was admitted with chest pain and ruled out. His symptoms were felt to be atypical. Nuclear study showed an ejection fraction of 58%. No infarct or ischemia. Abd ultrasound 9/19 showed no AAA.  Echocardiogram November 2020 showed normal LV function, moderate left atrial enlargement.  Since last seen   Current Outpatient Medications  Medication Sig Dispense Refill   acetaminophen (TYLENOL) 500 MG tablet Take 500 mg by mouth every 8 (eight) hours as needed for mild pain.      amLODipine (NORVASC) 10 MG tablet Take 1 tablet (10 mg total) by mouth daily. 90 tablet 2   clobetasol cream (TEMOVATE) 0.05 % Apply topically.     fluticasone (FLONASE) 50 MCG/ACT nasal spray Place 2 sprays into both nostrils daily as needed for allergies or rhinitis. 48 mL 3   latanoprost (XALATAN) 0.005 % ophthalmic solution Place 1 drop into both eyes at bedtime.     levothyroxine (SYNTHROID) 137 MCG tablet TAKE 1 TABLET BY MOUTH EVERY DAY BEFORE BREAKFAST 90 tablet 1   meloxicam (MOBIC) 15 MG tablet Take 1 tablet (15 mg total) by mouth daily. 90 tablet 0   metoprolol succinate (TOPROL-XL) 25 MG 24 hr tablet TAKE 1 TABLET (25 MG TOTAL) BY MOUTH DAILY. 90 tablet 1   montelukast (SINGULAIR) 10 MG tablet TAKE 1 TABLET BY MOUTH EVERYDAY AT BEDTIME 90 tablet 1   OVER THE COUNTER MEDICATION Super Beta Prostate     rosuvastatin (CRESTOR) 40 MG tablet Take 1 tablet (40 mg total) by mouth daily. 90 tablet 2   tamsulosin (FLOMAX) 0.4 MG CAPS capsule Take 1 capsule (0.4 mg total) by mouth daily. 90 capsule 3   traMADol (ULTRAM) 50 MG tablet TAKE NIGHTLY AS NEEDED FOR PAIN. 90 tablet 1   zolpidem (AMBIEN) 10 MG tablet TAKE 1 TABLET BY MOUTH AT BEDTIME AS NEEDED FOR SLEEP. 30 tablet 5   No current facility-administered medications for this visit.     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  Tobacco Use   Smoking status: Former    Types: Cigarettes   Smokeless tobacco: Never   Tobacco comments:    2003 quit  Vaping Use   Vaping Use: Never used  Substance and Sexual Activity   Alcohol use: Yes    Alcohol/week: 0.0 standard drinks of alcohol    Comment: occasional   Drug use: No   Sexual activity: Not on file  Other Topics Concern   Not on file  Social History Narrative   Not on file   Social Determinants of Health   Financial Resource Strain: Not on file  Food Insecurity: Not on file  Transportation Needs: Not on file  Physical Activity: Not on file  Stress: Not on file  Social  Connections: Not on file  Intimate Partner Violence: Not on file    Family History  Problem Relation Age of Onset   Breast cancer Mother    Diabetes Brother    Heart disease Brother    Colon cancer Neg Hx     ROS: no fevers or chills, productive cough, hemoptysis, dysphasia, odynophagia, melena, hematochezia, dysuria, hematuria, rash, seizure activity, orthopnea, PND, pedal edema, claudication. Remaining systems are negative.  Physical Exam: Well-developed well-nourished in no acute distress.  Skin is warm and dry.  HEENT is normal.  Neck is supple.  Chest is clear to auscultation with normal expansion.  Cardiovascular exam is regular rate and rhythm.  Abdominal exam nontender or distended. No masses palpated. Extremities show no edema. neuro grossly intact  ECG- personally  reviewed  A/P  1 coronary artery disease-patient doing well with no chest pain.  Continue aspirin and statin.  2 hypertension-patient's blood pressure is controlled today.  Continue present medical regimen.  3 hyperlipidemia-continue Crestor.  4 aortic stenosis murmur-we will plan repeat echocardiogram.  Kirk Ruths, MD

## 2021-12-23 ENCOUNTER — Ambulatory Visit (INDEPENDENT_AMBULATORY_CARE_PROVIDER_SITE_OTHER): Payer: Medicare Other

## 2021-12-23 VITALS — Wt 216.0 lb

## 2021-12-23 DIAGNOSIS — Z0000 Encounter for general adult medical examination without abnormal findings: Secondary | ICD-10-CM

## 2021-12-23 NOTE — Patient Instructions (Signed)
Jeffrey Rhodes , Thank you for taking time to come for your Medicare Wellness Visit. I appreciate your ongoing commitment to your health goals. Please review the following plan we discussed and let me know if I can assist you in the future.   These are the goals we discussed:  Goals      Patient Stated     Stay active and healthy        This is a list of the screening recommended for you and due dates:  Health Maintenance  Topic Date Due   Colon Cancer Screening  07/08/2021   Flu Shot  09/22/2021   Medicare Annual Wellness Visit  12/24/2022   Tetanus Vaccine  12/13/2027   Pneumonia Vaccine  Completed   Hepatitis C Screening: USPSTF Recommendation to screen - Ages 18-79 yo.  Completed   Zoster (Shingles) Vaccine  Completed   HPV Vaccine  Aged Out   COVID-19 Vaccine  Discontinued    Advanced directives: Please bring a copy of your health care power of attorney and living will to the office at your convenience.   Conditions/risks identified: stay healthy and active   Next appointment: Follow up in one year for your annual wellness visit.    Preventive Care 79 Years and Older, Male  Preventive care refers to lifestyle choices and visits with your health care provider that can promote health and wellness. What does preventive care include? A yearly physical exam. This is also called an annual well check. Dental exams once or twice a year. Routine eye exams. Ask your health care provider how often you should have your eyes checked. Personal lifestyle choices, including: Daily care of your teeth and gums. Regular physical activity. Eating a healthy diet. Avoiding tobacco and drug use. Limiting alcohol use. Practicing safe sex. Taking low doses of aspirin every day. Taking vitamin and mineral supplements as recommended by your health care provider. What happens during an annual well check? The services and screenings done by your health care provider during your annual well  check will depend on your age, overall health, lifestyle risk factors, and family history of disease. Counseling  Your health care provider may ask you questions about your: Alcohol use. Tobacco use. Drug use. Emotional well-being. Home and relationship well-being. Sexual activity. Eating habits. History of falls. Memory and ability to understand (cognition). Work and work Statistician. Screening  You may have the following tests or measurements: Height, weight, and BMI. Blood pressure. Lipid and cholesterol levels. These may be checked every 5 years, or more frequently if you are over 68 years old. Skin check. Lung cancer screening. You may have this screening every year starting at age 34 if you have a 30-pack-year history of smoking and currently smoke or have quit within the past 15 years. Fecal occult blood test (FOBT) of the stool. You may have this test every year starting at age 81. Flexible sigmoidoscopy or colonoscopy. You may have a sigmoidoscopy every 5 years or a colonoscopy every 10 years starting at age 79. Prostate cancer screening. Recommendations will vary depending on your family history and other risks. Hepatitis C blood test. Hepatitis B blood test. Sexually transmitted disease (STD) testing. Diabetes screening. This is done by checking your blood sugar (glucose) after you have not eaten for a while (fasting). You may have this done every 1-3 years. Abdominal aortic aneurysm (AAA) screening. You may need this if you are a current or former smoker. Osteoporosis. You may be screened starting at age  70 if you are at high risk. Talk with your health care provider about your test results, treatment options, and if necessary, the need for more tests. Vaccines  Your health care provider may recommend certain vaccines, such as: Influenza vaccine. This is recommended every year. Tetanus, diphtheria, and acellular pertussis (Tdap, Td) vaccine. You may need a Td booster  every 10 years. Zoster vaccine. You may need this after age 72. Pneumococcal 13-valent conjugate (PCV13) vaccine. One dose is recommended after age 43. Pneumococcal polysaccharide (PPSV23) vaccine. One dose is recommended after age 49. Talk to your health care provider about which screenings and vaccines you need and how often you need them. This information is not intended to replace advice given to you by your health care provider. Make sure you discuss any questions you have with your health care provider. Document Released: 03/07/2015 Document Revised: 10/29/2015 Document Reviewed: 12/10/2014 Elsevier Interactive Patient Education  2017 [place] Prevention in the Home Falls can cause injuries. They can happen to people of all ages. There are many things you can do to make your home safe and to help prevent falls. What can I do on the outside of my home? Regularly fix the edges of walkways and driveways and fix any cracks. Remove anything that might make you trip as you walk through a door, such as a raised step or threshold. Trim any bushes or trees on the path to your home. Use bright outdoor lighting. Clear any walking paths of anything that might make someone trip, such as rocks or tools. Regularly check to see if handrails are loose or broken. Make sure that both sides of any steps have handrails. Any raised decks and porches should have guardrails on the edges. Have any leaves, snow, or ice cleared regularly. Use sand or salt on walking paths during winter. Clean up any spills in your garage right away. This includes oil or grease spills. What can I do in the bathroom? Use night lights. Install grab bars by the toilet and in the tub and shower. Do not use towel bars as grab bars. Use non-skid mats or decals in the tub or shower. If you need to sit down in the shower, use a plastic, non-slip stool. Keep the floor dry. Clean up any water that spills on the floor as soon  as it happens. Remove soap buildup in the tub or shower regularly. Attach bath mats securely with double-sided non-slip rug tape. Do not have throw rugs and other things on the floor that can make you trip. What can I do in the bedroom? Use night lights. Make sure that you have a light by your bed that is easy to reach. Do not use any sheets or blankets that are too big for your bed. They should not hang down onto the floor. Have a firm chair that has side arms. You can use this for support while you get dressed. Do not have throw rugs and other things on the floor that can make you trip. What can I do in the kitchen? Clean up any spills right away. Avoid walking on wet floors. Keep items that you use a lot in easy-to-reach places. If you need to reach something above you, use a strong step stool that has a grab bar. Keep electrical cords out of the way. Do not use floor polish or wax that makes floors slippery. If you must use wax, use non-skid floor wax. Do not have throw rugs and other  things on the floor that can make you trip. What can I do with my stairs? Do not leave any items on the stairs. Make sure that there are handrails on both sides of the stairs and use them. Fix handrails that are broken or loose. Make sure that handrails are as long as the stairways. Check any carpeting to make sure that it is firmly attached to the stairs. Fix any carpet that is loose or worn. Avoid having throw rugs at the top or bottom of the stairs. If you do have throw rugs, attach them to the floor with carpet tape. Make sure that you have a light switch at the top of the stairs and the bottom of the stairs. If you do not have them, ask someone to add them for you. What else can I do to help prevent falls? Wear shoes that: Do not have high heels. Have rubber bottoms. Are comfortable and fit you well. Are closed at the toe. Do not wear sandals. If you use a stepladder: Make sure that it is fully  opened. Do not climb a closed stepladder. Make sure that both sides of the stepladder are locked into place. Ask someone to hold it for you, if possible. Clearly mark and make sure that you can see: Any grab bars or handrails. First and last steps. Where the edge of each step is. Use tools that help you move around (mobility aids) if they are needed. These include: Canes. Walkers. Scooters. Crutches. Turn on the lights when you go into a dark area. Replace any light bulbs as soon as they burn out. Set up your furniture so you have a clear path. Avoid moving your furniture around. If any of your floors are uneven, fix them. If there are any pets around you, be aware of where they are. Review your medicines with your doctor. Some medicines can make you feel dizzy. This can increase your chance of falling. Ask your doctor what other things that you can do to help prevent falls. This information is not intended to replace advice given to you by your health care provider. Make sure you discuss any questions you have with your health care provider. Document Released: 12/05/2008 Document Revised: 07/17/2015 Document Reviewed: 03/15/2014 Elsevier Interactive Patient Education  2017 [employer].

## 2021-12-23 NOTE — Progress Notes (Signed)
I connected with  Jeffrey Rhodes on 12/23/21 by a audio enabled telemedicine application and verified that I am speaking with the correct person using two identifiers.  Patient Location: Home  Provider Location: Home Office  I discussed the limitations of evaluation and management by telemedicine. The patient expressed understanding and agreed to proceed.   Subjective:   Jeffrey Rhodes is a 81 y.o. male who presents for an Initial Medicare Annual Wellness Visit.  Review of Systems     Cardiac Risk Factors include: advanced age (>22mn, >>54women);hypertension;dyslipidemia;male gender     Objective:    Today's Vitals   12/23/21 1447  Weight: 216 lb (98 kg)   Body mass index is 29.29 kg/m.     12/23/2021    2:51 PM 01/17/2017    8:45 AM 07/08/2016    9:53 AM 06/24/2016    7:43 AM 04/27/2015    7:36 PM  Advanced Directives  Does Patient Have a Medical Advance Directive? Yes Yes Yes Yes No  Type of [place]amed[place]of [place] will [place]Living will [place] CityLiving will [place]Living will   Does patient want to make changes to medical advance directive?  Yes (MAU/Ambulatory/Procedural Areas - Information given)     Copy of HFort Supplyin Chart? No - copy requested No - copy requested No - copy requested No - copy requested   Would patient like information on creating a medical advance directive?     No - patient declined information    Current Medications (verified) Outpatient Encounter Medications as of 12/23/2021  Medication Sig   acetaminophen (TYLENOL) 500 MG tablet Take 500 mg by mouth every 8 (eight) hours as needed for mild pain.    amLODipine (NORVASC) 10 MG tablet Take 1 tablet (10 mg total) by mouth daily.   clobetasol cream (TEMOVATE) 0.05 % Apply topically.   fluticasone (FLONASE) 50 MCG/ACT nasal spray Place 2 sprays into both nostrils daily as needed for allergies  or rhinitis.   latanoprost (XALATAN) 0.005 % ophthalmic solution Place 1 drop into both eyes at bedtime.   levothyroxine (SYNTHROID) 137 MCG tablet TAKE 1 TABLET BY MOUTH EVERY DAY BEFORE BREAKFAST   meloxicam (MOBIC) 15 MG tablet Take 1 tablet (15 mg total) by mouth daily.   metoprolol succinate (TOPROL-XL) 25 MG 24 hr tablet TAKE 1 TABLET (25 MG TOTAL) BY MOUTH DAILY.   montelukast (SINGULAIR) 10 MG tablet TAKE 1 TABLET BY MOUTH EVERYDAY AT BEDTIME   OVER THE COUNTER MEDICATION Super Beta Prostate   rosuvastatin (CRESTOR) 40 MG tablet Take 1 tablet (40 mg total) by mouth daily.   tamsulosin (FLOMAX) 0.4 MG CAPS capsule Take 1 capsule (0.4 mg total) by mouth daily.   traMADol (ULTRAM) 50 MG tablet TAKE NIGHTLY AS NEEDED FOR PAIN.   zolpidem (AMBIEN) 10 MG tablet TAKE 1 TABLET BY MOUTH AT BEDTIME AS NEEDED FOR SLEEP.   No facility-administered encounter medications on file as of 12/23/2021.    Allergies (verified) Bee venom, Lisinopril, and Losartan   History: Past Medical History:  Diagnosis Date   Allergy    Arthritis    Coronary artery disease    High cholesterol    Hypertension    Hypothyroid    Past Surgical History:  Procedure Laterality Date   CORONARY ARTERY BYPASS GRAFT  2014   [place]   DENTAL RESTORATION/EXTRACTION WITH X-RAY     Family History  Problem Relation Age of Onset  Breast cancer Mother    Diabetes Brother    Heart disease Brother    Colon cancer Neg Hx    Social History   Socioeconomic History   Marital status: Widowed    Spouse name: Not on file   Number of children: 2   Years of education: Not on file   Highest education level: Not on file  Occupational History   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  Tobacco Use   Smoking status: Former    Types: Cigarettes   Smokeless tobacco: Never   Tobacco comments:    2003 quit  Vaping Use   Vaping Use: Never used  Substance and Sexual Activity   Alcohol use: Yes    Alcohol/week: 0.0 standard  drinks of alcohol    Comment: occasional   Drug use: No   Sexual activity: Not on file  Other Topics Concern   Not on file  Social History Narrative   Not on file   Social Determinants of Health   Financial Resource Strain: Low Risk  (12/23/2021)   Overall Financial Resource Strain (CARDIA)    Difficulty of Paying Living Expenses: Not hard at all  Food Insecurity: No Food Insecurity (12/23/2021)   Hunger Vital Sign    Worried About Running Out of Food in the Last Year: Never true    Ran Out of Food in the Last Year: Never true  Transportation Needs: No Transportation Needs (12/23/2021)   PRAPARE - Hydrologist (Medical): No    Lack of Transportation (Non-Medical): No  Physical Activity: Insufficiently Active (12/23/2021)   Exercise Vital Sign    Days of Exercise per Week: 5 days    Minutes of Exercise per Session: 20 min  Stress: No Stress Concern Present (12/23/2021)   [place]    Feeling of Stress : Not at all  Social Connections: Socially Isolated (12/23/2021)   Social Connection and Isolation Panel [NHANES]    Frequency of Communication with Friends and Family: More than three times a week    Frequency of Social Gatherings with Friends and Family: More than three times a week    Attends Religious Services: Never    Marine scientist or Organizations: No    Attends Archivist Meetings: Never    Marital Status: Widowed    Tobacco Counseling Counseling given: Not Answered Tobacco comments: 2003 quit   Clinical Intake:  Pre-visit preparation completed: Yes  Pain : No/denies pain     BMI - recorded: 29.29 Nutritional Status: BMI 25 -29 Overweight Nutritional Risks: None Diabetes: No  How often do you need to have someone help you when you read instructions, pamphlets, or other written materials from your doctor or pharmacy?: 1 -  Never  Diabetic?no  Interpreter Needed?: No  Information entered by :: Charlott Rakes, LPN   Activities of Daily Living    12/23/2021    2:53 PM  In your present state of health, do you have any difficulty performing the following activities:  Hearing? 0  Vision? 0  Difficulty concentrating or making decisions? 0  Walking or climbing stairs? 0  Dressing or bathing? 0  Doing errands, shopping? 0  Preparing Food and eating ? N  Using the Toilet? N  In the past six months, have you accidently leaked urine? N  Do you have problems with loss of bowel control? N  Managing your Medications? N  Managing your Finances? N  Housekeeping  or managing your Housekeeping? N    Patient Care Team: Shelda Pal, DO as PCP - General (Family Medicine) Stanford Breed Denice Bors, MD as PCP - Cardiology (Cardiology)  Indicate any recent Medical Services you may have received from other than [facility] providers in the past year (date may be approximate).     Assessment:   This is a routine wellness examination for [place].  Hearing/Vision screen Hearing Screening - Comments:: Pt denies any hearing  issues  Vision Screening - Comments:: Pt follows up with [facility] for [facility] eye exams   Dietary issues and exercise activities discussed: Current Exercise Habits: Home exercise routine, Type of exercise: walking, Time (Minutes): 20, Frequency (Times/Week): 5, Weekly Exercise (Minutes/Week): 100   Goals Addressed             This Visit's Progress    Patient Stated       Stay active and healthy       Depression Screen    12/23/2021    2:50 PM 12/15/2020    7:02 AM 06/18/2019    7:32 AM 10/01/2016    7:34 AM  PHQ 2/9 Scores  PHQ - 2 Score 0 0 0 0  PHQ- 9 Score  0  4    Fall Risk    12/23/2021    2:53 PM 06/09/2020    7:21 AM 01/17/2019    9:45 AM 10/01/2016    7:33 AM  Fall Risk   Falls in the past year? 0 0 0 No  Comment   Emmi Telephone Survey: data to providers prior to load    Number falls in past yr: 0 0    Injury with Fall? 0 0    Risk for fall due to : Impaired vision     Follow up Falls prevention discussed       FALL RISK PREVENTION PERTAINING TO THE HOME:  Any stairs in or around the home? Yes  If so, are there any without handrails? No  Home free of loose throw rugs in walkways, pet beds, electrical cords, etc? Yes  Adequate lighting in your home to reduce risk of falls? Yes   ASSISTIVE DEVICES UTILIZED TO PREVENT FALLS:  Life alert? No  Use of a cane, walker or w/c? No  Grab bars in the bathroom? No  Shower chair or bench in shower? No  Elevated toilet seat or a handicapped toilet? No   TIMED UP AND GO:  Was the test performed? No .   Cognitive Function:        12/23/2021    2:54 PM  6CIT Screen  What Year? 0 points  What month? 0 points  What time? 0 points  Count back from 20 0 points  Months in reverse 4 points  Repeat phrase 0 points  Total Score 4 points    Immunizations Immunization History  Administered Date(s) Administered   Fluad Quad(high Dose 65+) 12/12/2019, 12/15/2020   Influenza, High Dose Seasonal PF 12/12/2017   Moderna Sars-Covid-2 Vaccination 04/24/2019, 05/23/2019   PNEUMOCOCCAL CONJUGATE-20 12/15/2020   Tdap 12/12/2017   Zoster Recombinat (Shingrix) 06/10/2017, 08/15/2017    TDAP status: Up to date  Flu Vaccine status: Due, Education has been provided regarding the importance of this vaccine. Advised may receive this vaccine at local pharmacy or Health Dept. Aware to provide a copy of the vaccination record if obtained from local pharmacy or Health Dept. Verbalized acceptance and understanding.  Pneumococcal vaccine status: Up to date  Covid-19 vaccine  status: Completed vaccines  Qualifies for Shingles Vaccine? Yes   Zostavax completed Yes   Shingrix Completed?: Yes  Screening Tests Health Maintenance  Topic Date Due   COLONOSCOPY (Pts 45-75yr Insurance coverage will need to be confirmed)   07/08/2021   INFLUENZA VACCINE  09/22/2021   Medicare Annual Wellness (AWV)  12/24/2022   TETANUS/TDAP  12/13/2027   Pneumonia Vaccine 81 Years old  Completed   Hepatitis C Screening  Completed   Zoster Vaccines- Shingrix  Completed   HPV VACCINES  Aged Out   COVID-19 Vaccine  Discontinued    Health Maintenance  Health Maintenance Due  Topic Date Due   COLONOSCOPY (Pts 45-439yrInsurance coverage will need to be confirmed)  07/08/2021   INFLUENZA VACCINE  09/22/2021    Colorectal cancer screening: No longer required. Per pt stated   Additional Screening:  Hepatitis C Screening:  Completed 12/12/19  Vision Screening: Recommended annual ophthalmology exams for early detection of glaucoma and other disorders of the eye. Is the patient up to date with their annual eye exam?  Yes  Who is the provider or what is the name of the office in which the patient attends annual eye exams? [facility]  If pt is not established with a provider, would they like to be referred to a provider to establish care? No .   Dental Screening: Recommended annual dental exams for proper oral hygiene  Community Resource Referral / Chronic Care Management: CRR required this visit?  No   CCM required this visit?  No      Plan:     I have personally reviewed and noted the following in the patient's chart:   Medical and social history Use of alcohol, tobacco or illicit drugs  Current medications and supplements including opioid prescriptions. Patient is currently taking opioid prescriptions. Information provided to patient regarding non-opioid alternatives. Patient advised to discuss non-opioid treatment plan with their provider. Functional ability and status Nutritional status Physical activity Advanced directives List of other physicians Hospitalizations, surgeries, and ER visits in previous 12 months Vitals Screenings to include cognitive, depression, and falls Referrals and appointments  In  addition, I have reviewed and discussed with patient certain preventive protocols, quality metrics, and best practice recommendations. A written personalized care plan for preventive services as well as general preventive health recommendations were provided to patient.     TiWillette BraceLPN   [id number] Nurse Notes: pt wants flu  shot at next visit 12/30/21, also requesting Advanced directive paper work to complete.

## 2021-12-30 ENCOUNTER — Encounter: Payer: Self-pay | Admitting: Family Medicine

## 2021-12-30 ENCOUNTER — Encounter: Payer: Self-pay | Admitting: Internal Medicine

## 2021-12-30 ENCOUNTER — Ambulatory Visit (INDEPENDENT_AMBULATORY_CARE_PROVIDER_SITE_OTHER): Payer: Medicare Other | Admitting: Family Medicine

## 2021-12-30 ENCOUNTER — Ambulatory Visit: Payer: Medicare Other | Admitting: Cardiology

## 2021-12-30 VITALS — BP 128/70 | HR 58 | Temp 98.1°F | Ht 72.0 in | Wt 215.5 lb

## 2021-12-30 DIAGNOSIS — E039 Hypothyroidism, unspecified: Secondary | ICD-10-CM

## 2021-12-30 DIAGNOSIS — E785 Hyperlipidemia, unspecified: Secondary | ICD-10-CM

## 2021-12-30 DIAGNOSIS — Z23 Encounter for immunization: Secondary | ICD-10-CM

## 2021-12-30 DIAGNOSIS — I10 Essential (primary) hypertension: Secondary | ICD-10-CM | POA: Diagnosis not present

## 2021-12-30 DIAGNOSIS — N400 Enlarged prostate without lower urinary tract symptoms: Secondary | ICD-10-CM | POA: Diagnosis not present

## 2021-12-30 DIAGNOSIS — K635 Polyp of colon: Secondary | ICD-10-CM

## 2021-12-30 MED ORDER — ROSUVASTATIN CALCIUM 40 MG PO TABS: 40.0000 mg | ORAL_TABLET | Freq: Every day | ORAL | 3 refills | Status: DC

## 2021-12-30 MED ORDER — METOPROLOL SUCCINATE ER 25 MG PO TB24: 25.0000 mg | ORAL_TABLET | Freq: Every day | ORAL | 3 refills | Status: DC

## 2021-12-30 NOTE — Progress Notes (Signed)
Chief Complaint  Patient presents with   Follow-up    Subjective Jeffrey Rhodes is a 81 y.o. male who presents for hypertension follow up. He does not monitor home blood pressures. He is compliant with medications. Patient has these side effects of medication: none He is adhering to a healthy diet overall. Current exercise: walking, active around yard/house No Cp or SOB.  Hyperlipidemia Patient presents for dyslipidemia follow up. Currently being treated with Crestor 40 mg/d and compliance with treatment thus far has been good. He denies myalgias. Diet/exercise as above.  The patient is known to have coexisting coronary artery disease.  Hypothyroidism Patient presents for follow-up of hypothyroidism.  Reports compliance with medication. Current symptoms include: denies fatigue, weight changes, heat/cold intolerance, bowel/skin changes or CVS symptoms He believes his dose should be not significantly changed  BPH Hx of BPH well controlled on Flomax 0.4 mg/d. Reports compliance, no AE's. No bleeding, dc, pain. No constipation.    Past Medical History:  Diagnosis Date   Allergy    Arthritis    Coronary artery disease    High cholesterol    Hypertension    Hypothyroid     Exam BP 128/70 (BP Location: Left Arm, Patient Position: Sitting, Cuff Size: Normal)   Pulse (!) 58   Temp 98.1 F (36.7 C) (Oral)   Ht 6' (1.829 m)   Wt 215 lb 8 oz (97.8 kg)   SpO2 96%   BMI 29.23 kg/m  General:  well developed, well nourished, in no apparent distress Heart: Reg rhythm, bradycardic, no bruits, no LE edema Lungs: clear to auscultation, no accessory muscle use Abd: BS+, S, NT, ND Psych: well oriented with normal range of affect and appropriate judgment/insight  Essential hypertension, benign - Plan: metoprolol succinate (TOPROL-XL) 25 MG 24 hr tablet  Dyslipidemia - Plan: rosuvastatin (CRESTOR) 40 MG tablet, Lipid panel, Comprehensive metabolic panel  Hypothyroidism, unspecified  type - Plan: TSH, T4, free  Benign prostatic hyperplasia, unspecified whether lower urinary tract symptoms present  Chronic, stable. Cont Toprol XL 25 mg/d, Norvasc 5 mg/d. Counseled on diet and exercise. Chronic, stable. Cont Crestor 40 mg/d.  Chronic, stable. Cont Synthroid 75 mcg/d Chronic, stable. Cont Flomax 0.4 mg/d.  Advanced directive form provided today.  Flu shot today.  Refer to GI again as he has a hx of polyps and Dr. Henrene Pastor rec'd 5 yr f/u.  F/u in 6 mo. The patient voiced understanding and agreement to the plan.  [place], DO 12/30/21  7:33 AM

## 2021-12-30 NOTE — Addendum Note (Signed)
Addended by: Sharon Seller B on: 12/30/2021 07:41 AM   Modules accepted: Orders

## 2021-12-30 NOTE — Patient Instructions (Addendum)
If you do not hear anything about your referral in the next 1-2 weeks, call our office and ask for an update.  Give [place] 2-3 business days to get the results of your labs back.   Keep the diet clean and stay active.  Please get me a copy of your advanced directive form at your convenience.   Let us know if you need anything.

## 2022-01-06 ENCOUNTER — Other Ambulatory Visit: Payer: Self-pay | Admitting: Family Medicine

## 2022-01-11 ENCOUNTER — Encounter: Payer: Self-pay | Admitting: Family Medicine

## 2022-01-11 NOTE — Progress Notes (Signed)
HPI: Follow-up coronary artery disease. Patient had coronary artery bypass graft in [place] in 2014. In March 2017 he was admitted with chest pain and ruled out. His symptoms were felt to be atypical. Nuclear study showed an ejection fraction of 58%. No infarct or ischemia. Abd ultrasound 9/19 showed no AAA.  Echocardiogram November 2020 showed normal LV function, moderate left atrial enlargement.  Since last seen patient denies dyspnea on exertion, orthopnea, PND, pedal edema, chest pain or syncope.  Current Outpatient Medications  Medication Sig Dispense Refill   acetaminophen (TYLENOL) 500 MG tablet Take 500 mg by mouth every 8 (eight) hours as needed for mild pain.      amLODipine (NORVASC) 10 MG tablet Take 1 tablet (10 mg total) by mouth daily. 90 tablet 2   clobetasol cream (TEMOVATE) 0.05 % Apply topically.     fluticasone (FLONASE) 50 MCG/ACT nasal spray Place 2 sprays into both nostrils daily as needed for allergies or rhinitis. 48 mL 3   latanoprost (XALATAN) 0.005 % ophthalmic solution Place 1 drop into both eyes at bedtime.     levothyroxine (SYNTHROID) 137 MCG tablet TAKE 1 TABLET BY MOUTH EVERY DAY BEFORE BREAKFAST 90 tablet 1   meloxicam (MOBIC) 15 MG tablet TAKE 1 TABLET (15 MG TOTAL) BY MOUTH DAILY. 30 tablet 3   metoprolol succinate (TOPROL-XL) 25 MG 24 hr tablet Take 1 tablet (25 mg total) by mouth daily. 90 tablet 3   montelukast (SINGULAIR) 10 MG tablet TAKE 1 TABLET BY MOUTH EVERYDAY AT BEDTIME 90 tablet 1   OVER THE COUNTER MEDICATION Super Beta Prostate     rosuvastatin (CRESTOR) 40 MG tablet Take 1 tablet (40 mg total) by mouth daily. 90 tablet 3   tamsulosin (FLOMAX) 0.4 MG CAPS capsule Take 1 capsule (0.4 mg total) by mouth daily. 90 capsule 3   traMADol (ULTRAM) 50 MG tablet TAKE NIGHTLY AS NEEDED FOR PAIN. 90 tablet 1   zolpidem (AMBIEN) 10 MG tablet TAKE 1 TABLET BY MOUTH AT BEDTIME AS NEEDED FOR SLEEP. 30 tablet 5   No current facility-administered  medications for this visit.     Past Medical History:  Diagnosis Date   Allergy    Arthritis    Cataract    Coronary artery disease    GERD (gastroesophageal reflux disease)    Glaucoma    Heart murmur    High cholesterol    Hypertension    Hypothyroid    Myocardial infarction [facility])     Past Surgical History:  Procedure Laterality Date   CORONARY ARTERY BYPASS GRAFT  2014   [place]   DENTAL RESTORATION/EXTRACTION WITH X-RAY      Social History   Socioeconomic History   Marital status: Widowed    Spouse name: Not on file   Number of children: 2   Years of education: Not on file   Highest education level: Not on file  Occupational History   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  Tobacco Use   Smoking status: Former    Types: Cigarettes   Smokeless tobacco: Never   Tobacco comments:    2003 quit  Vaping Use   Vaping Use: Never used  Substance and Sexual Activity   Alcohol use: Yes    Alcohol/week: 0.0 standard drinks of alcohol    Comment: occasional   Drug use: No   Sexual activity: Not on file  Other Topics Concern   Not on file  Social History Narrative   Not on file  Social Determinants of Health   Financial Resource Strain: Low Risk  (12/23/2021)   Overall Financial Resource Strain (CARDIA)    Difficulty of Paying Living Expenses: Not hard at all  Food Insecurity: No Food Insecurity (12/23/2021)   Hunger Vital Sign    Worried About Running Out of Food in the Last Year: Never true    Ran Out of Food in the Last Year: Never true  Transportation Needs: No Transportation Needs (12/23/2021)   PRAPARE - Hydrologist (Medical): No    Lack of Transportation (Non-Medical): No  Physical Activity: Insufficiently Active (12/23/2021)   Exercise Vital Sign    Days of Exercise per Week: 5 days    Minutes of Exercise per Session: 20 min  Stress: No Stress Concern Present (12/23/2021)   [place]    Feeling of Stress : Not at all  Social Connections: Socially Isolated (12/23/2021)   Social Connection and Isolation Panel [NHANES]    Frequency of Communication with Friends and Family: More than three times a week    Frequency of Social Gatherings with Friends and Family: More than three times a week    Attends Religious Services: Never    Marine scientist or Organizations: No    Attends Archivist Meetings: Never    Marital Status: Widowed  Intimate Partner Violence: Not At Risk (12/23/2021)   Humiliation, Afraid, Rape, and Kick questionnaire    Fear of Current or Ex-Partner: No    Emotionally Abused: No    Physically Abused: No    Sexually Abused: No    Family History  Problem Relation Age of Onset   Breast cancer Mother    Diabetes Brother    Heart disease Brother    Colon cancer Neg Hx    Esophageal cancer Neg Hx    Rectal cancer Neg Hx    Stomach cancer Neg Hx     ROS: Arthralgias but no fevers or chills, productive cough, hemoptysis, dysphasia, odynophagia, melena, hematochezia, dysuria, hematuria, rash, seizure activity, orthopnea, PND, pedal edema, claudication. Remaining systems are negative.  Physical Exam: Well-developed well-nourished in no acute distress.  Skin is warm and dry.  HEENT is normal.  Neck is supple.  Chest is clear to auscultation with normal expansion.  Cardiovascular exam is regular rate and rhythm.  2/6 systolic murmur left sternal border.  S2 is not diminished. Abdominal exam nontender or distended. No masses palpated. Extremities show no edema. neuro grossly intact  ECG-normal sinus rhythm at a rate of 63, first-degree AV block, right bundle branch block, inferior infarct.  No change compared to 12/24/2020.  Personally reviewed  A/P  1 coronary artery disease-patient doing well with no chest pain.  Continue medical therapy with aspirin and statin.  2 murmur-patient sounds to have aortic  stenosis on examination.  Will arrange follow-up echocardiogram.  3 hypertension-patient's blood pressure is controlled.  Continue present medical regimen.  Check potassium and renal function.  4 hyperlipidemia-continue Crestor.  Check lipids and liver.  Kirk Ruths, MD

## 2022-01-22 ENCOUNTER — Other Ambulatory Visit: Payer: Self-pay

## 2022-01-22 ENCOUNTER — Ambulatory Visit (AMBULATORY_SURGERY_CENTER): Payer: Medicare Other

## 2022-01-22 VITALS — Ht 72.0 in | Wt 218.0 lb

## 2022-01-22 DIAGNOSIS — Z86010 Personal history of colonic polyps: Secondary | ICD-10-CM

## 2022-01-22 MED ORDER — NA SULFATE-K SULFATE-MG SULF 17.5-3.13-1.6 GM/177ML PO SOLN: 1.0000 | Freq: Once | ORAL | 0 refills | Status: AC

## 2022-01-22 NOTE — Progress Notes (Signed)
Denies allergies to eggs or soy products. Denies complication of anesthesia or sedation. Denies use of weight loss medication. Denies use of O2.   Emmi instructions given for colonoscopy.  

## 2022-01-25 ENCOUNTER — Encounter: Payer: Self-pay | Admitting: Cardiology

## 2022-01-25 ENCOUNTER — Ambulatory Visit: Payer: Medicare Other | Attending: Cardiology | Admitting: Cardiology

## 2022-01-25 VITALS — BP 134/74 | HR 63 | Ht 72.0 in | Wt 218.0 lb

## 2022-01-25 DIAGNOSIS — E785 Hyperlipidemia, unspecified: Secondary | ICD-10-CM

## 2022-01-25 DIAGNOSIS — Z951 Presence of aortocoronary bypass graft: Secondary | ICD-10-CM

## 2022-01-25 DIAGNOSIS — R011 Cardiac murmur, unspecified: Secondary | ICD-10-CM | POA: Diagnosis not present

## 2022-01-25 DIAGNOSIS — I25810 Atherosclerosis of coronary artery bypass graft(s) without angina pectoris: Secondary | ICD-10-CM | POA: Diagnosis not present

## 2022-01-25 DIAGNOSIS — I10 Essential (primary) hypertension: Secondary | ICD-10-CM | POA: Diagnosis not present

## 2022-01-25 NOTE — Patient Instructions (Signed)
  Testing/Procedures:  Your physician has requested that you have an echocardiogram. Echocardiography is a painless test that uses sound waves to create images of your heart. It provides your doctor with information about the size and shape of your heart and how well your heart's chambers and valves are working. This procedure takes approximately one hour. There are no restrictions for this procedure. Please do NOT wear cologne, perfume, aftershave, or lotions (deodorant is allowed). Please arrive 15 minutes prior to your appointment time. [place] [facility]   Follow-Up: At [facility], you and your health needs are our priority.  As part of our continuing mission to provide you with exceptional heart care, we have created designated Provider Care Teams.  These Care Teams include your primary Cardiologist (physician) and Advanced Practice Providers (APPs -  Physician Assistants and Nurse Practitioners) who all work together to provide you with the care you need, when you need it.  We recommend signing up for the patient portal called "MyChart".  Sign up information is provided on this After Visit Summary.  MyChart is used to connect with patients for Virtual Visits (Telemedicine).  Patients are able to view lab/test results, encounter notes, upcoming appointments, etc.  Non-urgent messages can be sent to your provider as well.   To learn more about what you can do with MyChart, go to [email or web address].    Your next appointment:   12 month(s) IN [place] [facility]  The format for your next appointment:   In Person  Provider:   Kirk Ruths, MD

## 2022-01-26 LAB — COMPREHENSIVE METABOLIC PANEL
ALT: 15 IU/L (ref 0–44)
AST: 16 IU/L (ref 0–40)
Albumin/Globulin Ratio: 1.4 (ref 1.2–2.2)
Albumin: 4.3 g/dL (ref 3.8–4.8)
Alkaline Phosphatase: 74 IU/L (ref 44–121)
BUN/Creatinine Ratio: 23 (ref 10–24)
BUN: 21 mg/dL (ref 8–27)
Bilirubin Total: 0.4 mg/dL (ref 0.0–1.2)
CO2: 25 mmol/L (ref 20–29)
Calcium: 9.4 mg/dL (ref 8.6–10.2)
Chloride: 102 mmol/L (ref 96–106)
Creatinine, Ser: 0.9 mg/dL (ref 0.76–1.27)
Globulin, Total: 3 g/dL (ref 1.5–4.5)
Glucose: 91 mg/dL (ref 70–99)
Potassium: 4.7 mmol/L (ref 3.5–5.2)
Sodium: 137 mmol/L (ref 134–144)
Total Protein: 7.3 g/dL (ref 6.0–8.5)
eGFR: 87 mL/min/{1.73_m2} (ref >59)

## 2022-01-26 LAB — LIPID PANEL
Chol/HDL Ratio: 2.7 ratio (ref 0.0–5.0)
Cholesterol, Total: 123 mg/dL (ref 100–199)
HDL: 45 mg/dL (ref >39)
LDL Chol Calc (NIH): 55 mg/dL (ref 0–99)
Triglycerides: 131 mg/dL (ref 0–149)
VLDL Cholesterol Cal: 23 mg/dL (ref 5–40)

## 2022-01-28 ENCOUNTER — Encounter: Payer: Self-pay | Admitting: *Deleted

## 2022-02-08 ENCOUNTER — Other Ambulatory Visit: Payer: Self-pay | Admitting: Family Medicine

## 2022-02-08 DIAGNOSIS — E039 Hypothyroidism, unspecified: Secondary | ICD-10-CM

## 2022-02-12 DIAGNOSIS — J019 Acute sinusitis, unspecified: Secondary | ICD-10-CM | POA: Diagnosis not present

## 2022-02-16 ENCOUNTER — Ambulatory Visit (HOSPITAL_COMMUNITY): Payer: Medicare Other | Attending: Cardiology

## 2022-02-16 DIAGNOSIS — R011 Cardiac murmur, unspecified: Secondary | ICD-10-CM | POA: Diagnosis not present

## 2022-02-16 LAB — ECHOCARDIOGRAM COMPLETE
AR max vel: 1.66 cm2
AV Area VTI: 1.55 cm2
AV Area mean vel: 1.68 cm2
AV Mean grad: 15 mmHg
AV Peak grad: 24.8 mmHg
Ao pk vel: 2.49 m/s
Area-P 1/2: 2.28 cm2
S' Lateral: 2.9 cm

## 2022-02-17 ENCOUNTER — Other Ambulatory Visit: Payer: Self-pay | Admitting: *Deleted

## 2022-02-17 DIAGNOSIS — I350 Nonrheumatic aortic (valve) stenosis: Secondary | ICD-10-CM

## 2022-02-19 ENCOUNTER — Ambulatory Visit (AMBULATORY_SURGERY_CENTER): Payer: Medicare Other | Admitting: Internal Medicine

## 2022-02-19 ENCOUNTER — Encounter: Payer: Self-pay | Admitting: Internal Medicine

## 2022-02-19 VITALS — BP 135/79 | HR 51 | Temp 96.6°F | Resp 12 | Ht 72.0 in | Wt 218.0 lb

## 2022-02-19 DIAGNOSIS — D122 Benign neoplasm of ascending colon: Secondary | ICD-10-CM | POA: Diagnosis not present

## 2022-02-19 DIAGNOSIS — Z09 Encounter for follow-up examination after completed treatment for conditions other than malignant neoplasm: Secondary | ICD-10-CM | POA: Diagnosis not present

## 2022-02-19 DIAGNOSIS — I2510 Atherosclerotic heart disease of native coronary artery without angina pectoris: Secondary | ICD-10-CM | POA: Diagnosis not present

## 2022-02-19 DIAGNOSIS — I10 Essential (primary) hypertension: Secondary | ICD-10-CM | POA: Diagnosis not present

## 2022-02-19 DIAGNOSIS — E039 Hypothyroidism, unspecified: Secondary | ICD-10-CM | POA: Diagnosis not present

## 2022-02-19 DIAGNOSIS — Z86010 Personal history of colonic polyps: Secondary | ICD-10-CM

## 2022-02-19 DIAGNOSIS — D123 Benign neoplasm of transverse colon: Secondary | ICD-10-CM

## 2022-02-19 MED ORDER — SODIUM CHLORIDE 0.9 % IV SOLN: 500.0000 mL | INTRAVENOUS | Status: DC

## 2022-02-19 NOTE — Progress Notes (Signed)
HISTORY OF PRESENT ILLNESS:  Jeffrey Rhodes is a 81 y.o. male with a history of adenomatous colon polyps.  Presents today for surveillance colonoscopy.  No complaints  REVIEW OF SYSTEMS:  All non-GI ROS negative except for  Past Medical History:  Diagnosis Date   Allergy    Arthritis    Cataract    Coronary artery disease    GERD (gastroesophageal reflux disease)    Glaucoma    Heart murmur    High cholesterol    Hypertension    Hypothyroid    Myocardial infarction [facility])     Past Surgical History:  Procedure Laterality Date   CORONARY ARTERY BYPASS GRAFT  2014   [place]   DENTAL RESTORATION/EXTRACTION WITH X-RAY      Social History Jeffrey Rhodes  reports that he has quit smoking. His smoking use included cigarettes. He has never used smokeless tobacco. He reports current alcohol use. He reports that he does not use drugs.  family history includes Breast cancer in his mother; Diabetes in his brother; Heart disease in his brother.  Allergies  Allergen Reactions   Bee Venom Other (See Comments)    unknown unknown    Lisinopril Other (See Comments)    cough Other reaction(s): COUGH Other reaction(s): COUGH cough Other reaction(s): COUGH cough Other reaction(s): COUGH Other reaction(s): COUGH cough Other reaction(s): COUGH Other reaction(s): COUGH cough    Losartan Cough       PHYSICAL EXAMINATION: Vital signs: BP (!) 141/70   Pulse 78   Temp (!) 96.6 F (35.9 C)   Ht 6' (1.829 m)   Wt 218 lb (98.9 kg)   SpO2 95%   BMI 29.57 kg/m  General: Well-developed, well-nourished, no acute distress HEENT: Sclerae are anicteric, conjunctiva pink. Oral mucosa intact Lungs: Clear Heart: Regular Abdomen: soft, nontender, nondistended, no obvious ascites, no peritoneal signs, normal bowel sounds. No organomegaly. Extremities: No edema Psychiatric: alert and oriented x3. Cooperative     ASSESSMENT:  History of adenomatous colon  polyps  PLAN:   Surveillance colonoscopy

## 2022-02-19 NOTE — Progress Notes (Signed)
Called to room to assist during endoscopic procedure.  Patient ID and intended procedure confirmed with present staff. Received instructions for my participation in the procedure from the performing physician.  

## 2022-02-19 NOTE — Progress Notes (Signed)
Pt's states no medical or surgical changes since previsit or office visit. 

## 2022-02-19 NOTE — Op Note (Signed)
[name] Patient Name: Jeffrey Rhodes Procedure Date: 02/19/2022 11:16 AM MRN: [id number] Endoscopist: Docia Chuck. Henrene Pastor , MD, [id number] Age: 81 Referring MD:  Date of Birth: [birth year] Gender: Male Account #: [id number] Procedure:                Colonoscopy with cold snare polypectomy x 1; biopsy                            polypectomy x 1 Indications:              High risk colon cancer surveillance: Personal                            history of non-advanced adenoma. Previous                            examination 2018 Medicines:                Monitored Anesthesia Care Procedure:                Pre-Anesthesia Assessment:                           - Prior to the procedure, a History and Physical                            was performed, and patient medications and                            allergies were reviewed. The patient's tolerance of                            previous anesthesia was also reviewed. The risks                            and benefits of the procedure and the sedation                            options and risks were discussed with the patient.                            All questions were answered, and informed consent                            was obtained. Prior Anticoagulants: The patient has                            taken no anticoagulant or antiplatelet agents. ASA                            Grade Assessment: II - A patient with mild systemic                            disease. After reviewing the risks and benefits,  the patient was deemed in satisfactory condition to                            undergo the procedure.                           After obtaining informed consent, the colonoscope                            was passed under direct vision. Throughout the                            procedure, the patient's blood pressure, pulse, and                            oxygen saturations were monitored continuously.  The                            Olympus CF-HQ190L ([id number]) Colonoscope was                            introduced through the anus and advanced to the the                            cecum, identified by appendiceal orifice and                            ileocecal valve. The ileocecal valve, appendiceal                            orifice, and rectum were photographed. The quality                            of the bowel preparation was excellent. The                            colonoscopy was performed without difficulty. The                            patient tolerated the procedure well. The bowel                            preparation used was SUPREP via split dose                            instruction. Scope In: 11:26:38 AM Scope Out: 11:39:50 AM Scope Withdrawal Time: 0 hours 10 minutes 46 seconds  Total Procedure Duration: 0 hours 13 minutes 12 seconds  Findings:                 A 4 mm polyp was found in the ascending colon. The                            polyp was removed with a cold snare. Resection and  retrieval were complete.                           A 1 mm polyp was found in the transverse colon. The                            polyp was removed with a jumbo cold forceps.                            Resection and retrieval were complete.                           Multiple diverticula were found in the sigmoid                            colon. Internal hemorrhoids.                           The exam was otherwise without abnormality on                            direct and retroflexion views. Complications:            No immediate complications. Estimated blood loss:                            None. Estimated Blood Loss:     Estimated blood loss: none. Impression:               - One 4 mm polyp in the ascending colon, removed                            with a cold snare. Resected and retrieved.                           - One 1 mm polyp in the  transverse colon, removed                            with a jumbo cold forceps. Resected and retrieved.                           - Diverticulosis in the sigmoid colon. Internal                            hemorrhoids.                           - The examination was otherwise normal on direct                            and retroflexion views. Recommendation:           - Repeat colonoscopy is not recommended for                            surveillance.                           -  Patient has a contact number available for                            emergencies. The signs and symptoms of potential                            delayed complications were discussed with the                            patient. Return to normal activities tomorrow.                            Written discharge instructions were provided to the                            patient.                           - Resume previous diet.                           - Continue present medications.                           - Await pathology results. Docia Chuck. Henrene Pastor, MD 02/19/2022 11:46:40 AM This report has been signed electronically.

## 2022-02-19 NOTE — Patient Instructions (Signed)
Resume previous diet and medications. No repeat Colonoscopy recommended. Call office as needed with any questions or concerns.  YOU HAD AN ENDOSCOPIC PROCEDURE TODAY AT [facility]:   Refer to the procedure report that was given to you for any specific questions about what was found during the examination.  If the procedure report does not answer your questions, please call your gastroenterologist to clarify.  If you requested that your care partner not be given the details of your procedure findings, then the procedure report has been included in a sealed envelope for you to review at your convenience later.  YOU SHOULD EXPECT: Some feelings of bloating in the abdomen. Passage of more gas than usual.  Walking can help get rid of the air that was put into your GI tract during the procedure and reduce the bloating. If you had a lower endoscopy (such as a colonoscopy or flexible sigmoidoscopy) you may notice spotting of blood in your stool or on the toilet paper. If you underwent a bowel prep for your procedure, you may not have a normal bowel movement for a few days.  Please Note:  You might notice some irritation and congestion in your nose or some drainage.  This is from the oxygen used during your procedure.  There is no need for concern and it should clear up in a day or so.  SYMPTOMS TO REPORT IMMEDIATELY:  Following lower endoscopy (colonoscopy or flexible sigmoidoscopy):  Excessive amounts of blood in the stool  Significant tenderness or worsening of abdominal pains  Swelling of the abdomen that is new, acute  Fever of 100F or higher   For urgent or emergent issues, a gastroenterologist can be reached at any hour by calling [phone]. Do not use MyChart messaging for urgent concerns.    DIET:  We do recommend a small meal at first, but then you may proceed to your regular diet.  Drink plenty of fluids but you should avoid alcoholic beverages for 24  hours.  ACTIVITY:  You should plan to take it easy for the rest of today and you should NOT DRIVE or use heavy machinery until tomorrow (because of the sedation medicines used during the test).    FOLLOW UP: Our staff will call the number listed on your records the next business day following your procedure.  We will call around 7:15- 8:00 am to check on you and address any questions or concerns that you may have regarding the information given to you following your procedure. If we do not reach you, we will leave a message.     If any biopsies were taken you will be contacted by phone or by letter within the next 1-3 weeks.  Please call us at [phone] if you have not heard about the biopsies in 3 weeks.    SIGNATURES/CONFIDENTIALITY: You and/or your care partner have signed paperwork which will be entered into your electronic medical record.  These signatures attest to the fact that that the information above on your After Visit Summary has been reviewed and is understood.  Full responsibility of the confidentiality of this discharge information lies with you and/or your care-partner.

## 2022-02-19 NOTE — Progress Notes (Signed)
Sedate, gd SR, tolerated procedure well, VSS, report to RN 

## 2022-02-24 ENCOUNTER — Telehealth: Payer: Self-pay | Admitting: *Deleted

## 2022-02-24 NOTE — Telephone Encounter (Signed)
Attempted to call patient for their post-procedure follow-up call. No answer. Left voicemail.   

## 2022-02-25 ENCOUNTER — Encounter: Payer: Self-pay | Admitting: Internal Medicine

## 2022-03-07 DIAGNOSIS — J0110 Acute frontal sinusitis, unspecified: Secondary | ICD-10-CM | POA: Diagnosis not present

## 2022-03-07 DIAGNOSIS — L02811 Cutaneous abscess of head [any part, except face]: Secondary | ICD-10-CM | POA: Diagnosis not present

## 2022-03-12 DIAGNOSIS — J069 Acute upper respiratory infection, unspecified: Secondary | ICD-10-CM | POA: Diagnosis not present

## 2022-03-12 DIAGNOSIS — J0110 Acute frontal sinusitis, unspecified: Secondary | ICD-10-CM | POA: Diagnosis not present

## 2022-03-16 ENCOUNTER — Ambulatory Visit (INDEPENDENT_AMBULATORY_CARE_PROVIDER_SITE_OTHER): Payer: PPO | Admitting: Family Medicine

## 2022-03-16 ENCOUNTER — Encounter: Payer: Self-pay | Admitting: Family Medicine

## 2022-03-16 VITALS — BP 128/78 | HR 63 | Temp 97.6°F | Ht 72.0 in | Wt 220.4 lb

## 2022-03-16 DIAGNOSIS — J302 Other seasonal allergic rhinitis: Secondary | ICD-10-CM

## 2022-03-16 MED ORDER — METHYLPREDNISOLONE ACETATE 80 MG/ML IJ SUSP
80.0000 mg | Freq: Once | INTRAMUSCULAR | Status: AC
  Administered 2022-03-16: 80 mg via INTRAMUSCULAR

## 2022-03-16 MED ORDER — FLUTICASONE PROPIONATE 50 MCG/ACT NA SUSP: 2.0000 | Freq: Every day | NASAL | 3 refills | Status: DC | PRN

## 2022-03-16 NOTE — Progress Notes (Signed)
Chief Complaint  Patient presents with   Allergies    Jeffrey Rhodes here for URI complaints.  Duration: several weeks  Associated symptoms: sinus congestion, rhinorrhea, itchy watery eyes, ear fullness, and coughing, sinus pressure , sneezing Denies: sinus pain, ear pain, ear drainage, sore throat, wheezing, shortness of breath, myalgia, and fevers Treatment to date: Singulair, Zyrtec, OTC cold meds Sick contacts: No  Past Medical History:  Diagnosis Date   Allergy    Arthritis    Cataract    Coronary artery disease    GERD (gastroesophageal reflux disease)    Glaucoma    Heart murmur    High cholesterol    Hypertension    Hypothyroid    Myocardial infarction (HCC)     Objective BP 128/78 (BP Location: Left Arm, Patient Position: Sitting, Cuff Size: Normal)   Pulse 63   Temp 97.6 F (36.4 C) (Oral)   Ht 6' (1.829 m)   Wt 220 lb 6 oz (100 kg)   SpO2 96%   BMI 29.89 kg/m  General: Awake, alert, appears stated age HEENT: AT, [place], ears patent b/l and TM's neg, nares patent w/o discharge, pharynx pink and without exudates, MMM Neck: No masses or asymmetry Heart: RRR Lungs: CTAB, no accessory muscle use Psych: Age appropriate judgment and insight, normal mood and affect  Seasonal allergic rhinitis, unspecified trigger  Exacerbation of chronic issue. Depomedrol injection today. Go back on INCS, cont Zyrtec and Singulair. Continue to push fluids, practice good hand hygiene, cover mouth when coughing. F/u prn. If starting to experience fevers, shaking, or shortness of breath, seek immediate care. Pt voiced understanding and agreement to the plan.  [place], [place] 03/16/22 4:33 PM

## 2022-03-16 NOTE — Patient Instructions (Signed)
Continue to push fluids, practice good hand hygiene, and cover your mouth if you cough. ? ?If you start having fevers, shaking or shortness of breath, seek immediate care. ? ?OK to take Tylenol 1000 mg (2 extra strength tabs) or 975 mg (3 regular strength tabs) every 6 hours as needed. ? ?Let us know if you need anything. ?

## 2022-03-16 NOTE — Addendum Note (Signed)
Addended by: Sharon Seller B on: 03/16/2022 04:41 PM   Modules accepted: Orders

## 2022-03-27 DIAGNOSIS — H40029 Open angle with borderline findings, high risk, unspecified eye: Secondary | ICD-10-CM | POA: Diagnosis not present

## 2022-03-29 DIAGNOSIS — J019 Acute sinusitis, unspecified: Secondary | ICD-10-CM | POA: Diagnosis not present

## 2022-04-30 ENCOUNTER — Other Ambulatory Visit: Payer: Self-pay | Admitting: Family Medicine

## 2022-05-07 ENCOUNTER — Other Ambulatory Visit: Payer: Self-pay | Admitting: Family Medicine

## 2022-05-07 DIAGNOSIS — I10 Essential (primary) hypertension: Secondary | ICD-10-CM

## 2022-05-16 ENCOUNTER — Other Ambulatory Visit: Payer: Self-pay | Admitting: Family Medicine

## 2022-05-16 DIAGNOSIS — G8929 Other chronic pain: Secondary | ICD-10-CM

## 2022-05-17 NOTE — Telephone Encounter (Signed)
Last OV--03/16/2022 Last RF--11/11/2021--#90 with 1 refill

## 2022-06-04 DIAGNOSIS — H6123 Impacted cerumen, bilateral: Secondary | ICD-10-CM | POA: Diagnosis not present

## 2022-06-16 ENCOUNTER — Other Ambulatory Visit: Payer: Self-pay | Admitting: Family Medicine

## 2022-06-16 DIAGNOSIS — G4700 Insomnia, unspecified: Secondary | ICD-10-CM

## 2022-06-16 NOTE — Telephone Encounter (Signed)
Requesting: Ambien Contract: 06/15/21 UDS: 06/15/21 Last OV: 03/16/2022 Next OV: 06/28/2022 Last Refill: 12/07/2021, #30--5 RFs Database:   Please advise

## 2022-06-16 NOTE — Telephone Encounter (Signed)
Last OV==03/16/2022 Last RF==12/07/21==#30 with 5 refills.

## 2022-06-28 ENCOUNTER — Encounter: Payer: Self-pay | Admitting: Family Medicine

## 2022-06-28 ENCOUNTER — Ambulatory Visit (INDEPENDENT_AMBULATORY_CARE_PROVIDER_SITE_OTHER): Payer: PPO | Admitting: Family Medicine

## 2022-06-28 VITALS — BP 128/68 | HR 65 | Temp 97.5°F | Ht 72.0 in | Wt 213.0 lb

## 2022-06-28 DIAGNOSIS — G8929 Other chronic pain: Secondary | ICD-10-CM

## 2022-06-28 DIAGNOSIS — E039 Hypothyroidism, unspecified: Secondary | ICD-10-CM | POA: Diagnosis not present

## 2022-06-28 DIAGNOSIS — G4700 Insomnia, unspecified: Secondary | ICD-10-CM

## 2022-06-28 DIAGNOSIS — M25512 Pain in left shoulder: Secondary | ICD-10-CM

## 2022-06-28 DIAGNOSIS — N400 Enlarged prostate without lower urinary tract symptoms: Secondary | ICD-10-CM

## 2022-06-28 DIAGNOSIS — I10 Essential (primary) hypertension: Secondary | ICD-10-CM | POA: Diagnosis not present

## 2022-06-28 DIAGNOSIS — Z79899 Other long term (current) drug therapy: Secondary | ICD-10-CM

## 2022-06-28 DIAGNOSIS — M25511 Pain in right shoulder: Secondary | ICD-10-CM | POA: Diagnosis not present

## 2022-06-28 NOTE — Progress Notes (Signed)
Chief Complaint  Patient presents with   Follow-up    Needs something sent in for Allergies    Subjective Jeffrey Rhodes is a 82 y.o. male who presents for hypertension follow up. He does not monitor home blood pressures. He is compliant with medication- Norvasc 10 mg/d. Patient has these side effects of medication: none He is usually adhering to a healthy diet overall. Current exercise: walking, cycling, stretching, active at work No CP or SOB.   Hypothyroidism Patient presents for follow-up of hypothyroidism.  Reports compliance with medication levothyroxine 137 mcg/d. Current symptoms include: denies fatigue, weight changes, heat/cold intolerance, bowel/skin changes or CVS symptoms He believes his dose should be not significantly changed  Insomnia Long-standing hx of this. Has tried various OTC options, CBT/counseling and rx meds. Currently on Ambien 5-10 mg qhs. No AE's, usually works well for him.   Chronic shoulder pain Feels good today. Takes tramadol intermittently. No AE's. Does not take every day. No recent inj or change in activity. No neuro s/s's.   BPH On Flomax 0.4 mg qhs. No AE's, reports compliance. Wakes up 2 times per night usually to urinate. No bleeding, pain, discharge.    Past Medical History:  Diagnosis Date   Allergy    Arthritis    Cataract    Coronary artery disease    GERD (gastroesophageal reflux disease)    Glaucoma    Heart murmur    High cholesterol    Hypertension    Hypothyroid    Myocardial infarction (HCC)     Exam BP 128/68 (BP Location: Left Arm, Patient Position: Sitting, Cuff Size: Normal)   Pulse 65   Temp (!) 97.5 F (36.4 C) (Oral)   Ht 6' (1.829 m)   Wt 213 lb (96.6 kg)   SpO2 93%   BMI 28.89 kg/m  General:  well developed, well nourished, in no apparent distress Heart: RRR, no bruits, no LE edema Lungs: clear to auscultation, no accessory muscle use Abd: BS+, S, NT, ND  Psych: well oriented with normal range of  affect and appropriate judgment/insight  Essential hypertension, benign  Hypothyroidism, unspecified type  Insomnia, unspecified type  Chronic pain of both shoulders  Benign prostatic hyperplasia, unspecified whether lower urinary tract symptoms present  Encounter for long-term (current) use of high-risk medication - Plan: Drug Monitoring Panel [phone] , Urine  Chronic, stable. Cont Norvasc 10 mg/d. Counseled on diet and exercise. Chronic, stable. Cont levothyroxine 137 mcg/d.  Chronic, stable. Cont Ambien 5-10 mg qhs prn. Update CSC and UDS today.  Chronic, doing well today, will hold off on injection. Cont tramadol prn.  Chronic, stable. Cont Flomax 0.4 mg/d. F/u in 6 mo. The patient voiced understanding and agreement to the plan.  Jilda Roche [place], DO 06/28/22  7:31 AM

## 2022-06-28 NOTE — Patient Instructions (Addendum)
Keep the diet clean and stay active.  If you ever need a shot, let us know.   Let us know if you need anything.

## 2022-07-01 LAB — DRUG MONITORING PANEL 376104, URINE
Amphetamines: NEGATIVE ng/mL (ref <500)
Barbiturates: NEGATIVE ng/mL (ref <300)
Benzodiazepines: NEGATIVE ng/mL (ref <100)
Cocaine Metabolite: NEGATIVE ng/mL (ref <150)
Codeine: 168 ng/mL — ABNORMAL HIGH (ref <50)
Desmethyltramadol: 1782 ng/mL — ABNORMAL HIGH (ref <100)
Hydrocodone: NEGATIVE ng/mL (ref <50)
Hydromorphone: NEGATIVE ng/mL (ref <50)
Morphine: NEGATIVE ng/mL (ref <50)
Norhydrocodone: NEGATIVE ng/mL (ref <50)
Opiates: POSITIVE ng/mL — AB (ref <100)
Oxycodone: NEGATIVE ng/mL (ref <100)
Tramadol: >10000 ng/mL — ABNORMAL HIGH (ref <100)

## 2022-07-01 LAB — DM TEMPLATE

## 2022-07-02 ENCOUNTER — Other Ambulatory Visit: Payer: Self-pay | Admitting: Family Medicine

## 2022-07-02 DIAGNOSIS — E039 Hypothyroidism, unspecified: Secondary | ICD-10-CM

## 2022-09-25 DIAGNOSIS — H40029 Open angle with borderline findings, high risk, unspecified eye: Secondary | ICD-10-CM | POA: Diagnosis not present

## 2022-10-15 ENCOUNTER — Other Ambulatory Visit: Payer: Self-pay | Admitting: Family Medicine

## 2022-10-30 DIAGNOSIS — H40029 Open angle with borderline findings, high risk, unspecified eye: Secondary | ICD-10-CM | POA: Diagnosis not present

## 2022-11-14 ENCOUNTER — Other Ambulatory Visit: Payer: Self-pay | Admitting: Family Medicine

## 2022-11-14 DIAGNOSIS — E785 Hyperlipidemia, unspecified: Secondary | ICD-10-CM

## 2022-11-14 DIAGNOSIS — I10 Essential (primary) hypertension: Secondary | ICD-10-CM

## 2022-12-13 ENCOUNTER — Telehealth: Payer: Self-pay | Admitting: Cardiology

## 2022-12-13 ENCOUNTER — Other Ambulatory Visit: Payer: Self-pay | Admitting: Family Medicine

## 2022-12-13 DIAGNOSIS — G4700 Insomnia, unspecified: Secondary | ICD-10-CM

## 2022-12-13 NOTE — Telephone Encounter (Signed)
  Pt c/o of Chest Pain: STAT if active CP, including tightness, pressure, jaw pain, radiating pain to shoulder/upper arm/back, CP unrelieved by Nitro. Symptoms reported of SOB, nausea, vomiting, sweating.  1. Are you having CP right now? Soreness around heart, yes   2. Are you experiencing any other symptoms (ex. SOB, nausea, vomiting, sweating)? A little bit of SOB when moving around   3. Is your CP continuous or coming and going? Comes and goes, started Friday   4. Have you taken Nitroglycerin? No, does not have any    5. How long have you been experiencing CP? Since Friday   6. If NO CP at time of call then end call with telling Pt to call back or call 911 if Chest pain returns prior to return call from triage team.    Patient c/o Palpitations:  STAT if patient reporting lightheadedness, shortness of breath, or chest pain  How long have you had palpitations/irregular HR/ Afib? Are you having the symptoms now? Started Friday and lasted 2 days, no palpations now, last time he had it was less than a year ago  Are you currently experiencing lightheadedness, SOB or CP? CP and SOB   Do you have a history of afib (atrial fibrillation) or irregular heart rhythm? Heart murmer  Have you checked your BP or HR? (document readings if available): no   Are you experiencing any other symptoms? No   Patient states he started having chest soreness and a forceful heart beat Friday. He says he does not have the forceful heart beat now, but does still have soreness in his chest. He says he also has been SOB when moving around. He states he felt very bad Saturday and Sunday and had to lay down.

## 2022-12-13 NOTE — Telephone Encounter (Signed)
Spoke with pt, he reports a "soreness around his heart", more SOB and fatigue. He reports it got bad on Friday and so he did not do anything over the weekend and this morning after getting up the soreness started again. The discomfort is steady most of the time and gets worse when he moves around. He reports the SOB is with exertion but not walking to the mailbox and back. He reports getting tired easier than before. He was made an appointment to be seen tomorrow afternoon. ER precautions discussed with the patient.

## 2022-12-13 NOTE — Telephone Encounter (Signed)
Requesting: Ambien 10mg   Contract: 06/28/22 UDS: 06/28/22 Last Visit: 06/28/22 Next Visit: 01/03/23 Last Refill: 06/16/22 #30 and 5RF  Please Advise

## 2022-12-13 NOTE — Progress Notes (Unsigned)
Cardiology Office Note:    Date:  12/14/2022   ID:  Jeffrey Rhodes, DOB 1940/11/07, MRN [id number]  PCP:  Sharlene Dory, DO  Cardiologist:  Olga Millers, MD     Referring MD: Sharlene Dory*   Chief Complaint: chest soreness and shortness of breath  History of Present Illness:    Jeffrey Rhodes is a 82 y.o. male with a history of CAD s/p CABG in 2014 at [facility], mild aortic stenosis, hypertension, hyperlipidemia, hypothyroidism, and GERD who is followed by Dr. Jens Som and presents today for evaluation of chest soreness and shortness of breath.  Patient has a history of CAD with prior CABG in 2014 at [facility]. He was admitted in 04/2015 for chest pain. Myoview at that time was low risk with no evidence of ischemic or prior infarction. Patient was last seen by Dr. Jens Som in 01/2022 at which he was doing well from a cardiac standpoint. An Echo was ordered for further evaluation of a murmur noted on exam and showed LVEF of 60-65% with grade 1 diastolic dysfunction and mild aortic stenosis as well as borderline dilatation of the ascending aorta.   Patient called our office yesterday with reports of chest soreness and shortness of breath. Therefore, this visit was arranged for further evaluation. He states he has some chronic bilateral shoulder issues (left > right) due to some bone on bone osteoarthritis and rotator cuff issues. He will occasional have pain with this. However, last Thursday 12/09/2022, he started to have more severe pain than usual of his left shoulder. This pain has gradually improved over the last several days. It is much better today but he still reports some pain. This is worse with active or passive movement of his arm. He also reports being "sensitive" to palpitation on the left side of his chest. However, he denies any real chest pain. He states this feels different than the pain he had prior to his CABG. He reports some very mild dyspnea on exertion  but no significant symptoms. No orthopnea, PND, or edema. No significant palpitations, lightheadedness, dizziness, or syncope. He is able to complete 4.0 METS of physical activity without any chest pain or significant shortness of breath.   EKGs/Labs/Other Studies Reviewed:    The following studies were reviewed:  Myoview 04/28/2015: Impression: 1. No scintigraphic evidence of prior infarction or pharmacologically induced ischemia. 2. Normal left ventricular wall motion. 3. Left ventricular ejection fraction 58% 4. Low-risk stress test findings*. _______________  Echocardiogram 02/16/2022: Impressions: 1. Left ventricular ejection fraction, by estimation, is 60 to 65%. The  left ventricle has normal function. The left ventricle has no regional  wall motion abnormalities. Left ventricular diastolic parameters are  consistent with Grade I diastolic  dysfunction (impaired relaxation).   2. Right ventricular systolic function is normal. The right ventricular  size is normal. There is mildly elevated pulmonary artery systolic  pressure. The estimated right ventricular systolic pressure is 36.9 mmHg.   3. The mitral valve is degenerative. Trivial mitral valve regurgitation.  No evidence of mitral stenosis.   4. The aortic valve is incompletey visualized but appears calcified.  There is moderate-to-severe calcifcation of the aortic valve. There is  severe thickening of the aortic valve. Aortic valve regurgitation is not  visualized. Doppler angle suboptimal  with highest gradient obtained in the RSB. Based on this measurement,  there is mild aortic valve stenosis. Aortic valve mean gradient measures  15.0 mmHg. Aortic valve Vmax measures 2.49 m/s. AVA 1.43cm2,  DI 0.4.   5. Aortic dilatation noted. There is borderline dilatation of the  ascending aorta, measuring 39 mm.   6. The inferior vena cava is normal in size with greater than 50%  respiratory variability, suggesting right atrial  pressure of 3 mmHg.   7. Left atrial size was mildly dilated.   Comparison(s): Compared to prior TTE in 2021, there is now mild aortic  stenosis.   EKG:  EKG ordered today.   EKG Interpretation Date/Time:  Tuesday December 14 2022 15:01:48 EDT Ventricular Rate:  56 PR Interval:  238 QRS Duration:  134 QT Interval:  466 QTC Calculation: 449 R Axis:   -5  Text Interpretation: Sinus bradycardia with 1st degree A-V block with Premature atrial complexes Right bundle branch block Inferior Q waves noted T wave inversions in leads III and aVF T wave abnoramlities in leads V2-V3 Slight  Down-slowing ST segment in V3-V4 No significant ST/ T wave changes compared to prior tracings Reconfirmed by Marjie Skiff [phone]) on 12/14/2022 9:22:33 PM    Recent Labs: 01/25/2022: ALT 15; BUN 21; Creatinine, Ser 0.90; Potassium 4.7; Sodium 137  Recent Lipid Panel    Component Value Date/Time   CHOL 123 01/25/2022 0813   TRIG 131 01/25/2022 0813   HDL 45 01/25/2022 0813   CHOLHDL 2.7 01/25/2022 0813   CHOLHDL 3 06/15/2021 0723   VLDL 30.8 06/15/2021 0723   LDLCALC 55 01/25/2022 0813   LDLCALC 77 12/12/2019 0722    Physical Exam:    Vital Signs: BP (!) 124/50 (BP Location: Right Arm, Patient Position: Sitting, Cuff Size: Normal)   Pulse (!) 56   Ht 6' (1.829 m)   Wt 216 lb (98 kg)   SpO2 95%   BMI 29.29 kg/m     Wt Readings from Last 3 Encounters:  12/14/22 216 lb (98 kg)  06/28/22 213 lb (96.6 kg)  03/16/22 220 lb 6 oz (100 kg)     General: 82 y.o. Caucasian male in no acute distress. HEENT: Normocephalic and atraumatic. Sclera clear.  Neck: Supple. Soft left carotid bruit noted. No JVD. Heart: RRR. Distinct S1 and S2. III/VI systolic murmur noted. Mild left sided chest wall pain with palpation.  Lungs: No increased work of breathing. Clear to ausculation bilaterally. No wheezes, rhonchi, or rales.  Extremities: No lower extremity edema.   Skin: Warm and dry. Neuro: No focal  deficits. Psych: Normal affect. Responds appropriately.   Assessment:    1. Chronic left shoulder pain   2. Coronary artery disease involving native coronary artery of native heart without angina pectoris   3. S/P CABG (coronary artery bypass graft)   4. Aortic valve stenosis, etiology of cardiac valve disease unspecified   5. Bruit of left carotid artery   6. Essential hypertension, benign   7. Hyperlipidemia, unspecified hyperlipidemia type     Plan:    Left Shoulder Pain CAD s/p CABG  Patient has a history of CAD with prior CABG in 2014. Myoview in 2017 was low risk with no evidence of ischemia or prior infarction. - Patient reports left shoulder pain and some very mild left sided chest pain that is reproducible with palpation of chest wall. He has chronic left shoulder issues due to a rotator cuff issue and bone on bone osteoarthritis. Left shoulder pain is worse with passive and active range of motion.  - EKG today shows no acute ischemic changes compared to prior tracings.  - Continue aspirin and high-intensity statin. - Symptoms due to  not sound cardiac in nature. Sound musculoskeletal in nature due to underlying left shoulder issue. No ischemic evaluation necessary at this time. Recommended following up with Ortho.  Mild Aortic Stenosis Noted on Echo in 01/2022.  - Plan was for repeat Echo in 01/2023. Will go ahead and repeat this now.  Carotid Bruit Soft left sided carotid bruit noted on exam. Possible secondary to radiation from murmur.  - Will order carotid dopplers.   Hypertension BP well controlled.  - Continue Amlodipine 10mg  daily and Toprol-XL 25mg  daily.  Hyperlipidemia Lipid panel in 01/2022: Total Cholesterol 123, Triglycerides 131, HDL 45, LDL 55. LDL goal <70 given CAD. - Continue Crestor 40mg  daily.  Pre-Op Evaluation Patient is stable from a cardiac standpoint. He is able to complete >4.0 METS of physical activity without any anginal symptoms. If he  ends up requiring surgery for his left shoulder, he would be at acceptable risk for surgery without any additional cardiac evaluation as long as Echo does not show a drop in EF, new wall motion abnormalities, or severe progression of aortic stenosis.   Disposition: Patient already has follow-up with Dr. Jens Som scheduled for 03/2023.   Leanne Lovely, PA-C  12/14/2022 9:33 PM    [facility]

## 2022-12-14 ENCOUNTER — Ambulatory Visit: Payer: PPO | Attending: Student | Admitting: Student

## 2022-12-14 ENCOUNTER — Encounter: Payer: Self-pay | Admitting: Student

## 2022-12-14 VITALS — BP 124/50 | HR 56 | Ht 72.0 in | Wt 216.0 lb

## 2022-12-14 DIAGNOSIS — E785 Hyperlipidemia, unspecified: Secondary | ICD-10-CM

## 2022-12-14 DIAGNOSIS — I10 Essential (primary) hypertension: Secondary | ICD-10-CM | POA: Diagnosis not present

## 2022-12-14 DIAGNOSIS — I350 Nonrheumatic aortic (valve) stenosis: Secondary | ICD-10-CM

## 2022-12-14 DIAGNOSIS — Z951 Presence of aortocoronary bypass graft: Secondary | ICD-10-CM

## 2022-12-14 DIAGNOSIS — R0989 Other specified symptoms and signs involving the circulatory and respiratory systems: Secondary | ICD-10-CM | POA: Diagnosis not present

## 2022-12-14 DIAGNOSIS — G8929 Other chronic pain: Secondary | ICD-10-CM

## 2022-12-14 DIAGNOSIS — I2510 Atherosclerotic heart disease of native coronary artery without angina pectoris: Secondary | ICD-10-CM | POA: Diagnosis not present

## 2022-12-14 DIAGNOSIS — M25512 Pain in left shoulder: Secondary | ICD-10-CM

## 2022-12-14 NOTE — Patient Instructions (Signed)
Medication Instructions:  The current medical regimen is effective;  continue present plan and medications as directed. Please refer to the Current Medication list given to you today.  *If you need a refill on your cardiac medications before your next appointment, please call your pharmacy*  Lab Work: NONE  Testing/Procedures: Your physician has requested that you have an echocardiogram (with 2 weeks). Echocardiography is a painless test that uses sound waves to create images of your heart. It provides your doctor with information about the size and shape of your heart and how well your heart's chambers and valves are working. This procedure takes approximately one hour. There are no restrictions for this procedure. Please do NOT wear cologne, perfume, aftershave, or lotions (deodorant is allowed). Please arrive 15 minutes prior to your appointment time.   Your physician has requested that you have a carotid duplex. This test is an ultrasound of the carotid arteries in your neck. It looks at blood flow through these arteries that supply the brain with blood. Allow one hour for this exam. There are no restrictions or special instructions.   Follow-Up: At [facility], you and your health needs are our priority.  As part of our continuing mission to provide you with exceptional heart care, we have created designated Provider Care Teams.  These Care Teams include your primary Cardiologist (physician) and Advanced Practice Providers (APPs -  Physician Assistants and Nurse Practitioners) who all work together to provide you with the care you need, when you need it.  Your next appointment:   Keep scheduled appointment   Provider:   Olga Millers, MD     Other Instructions

## 2022-12-24 ENCOUNTER — Ambulatory Visit (HOSPITAL_COMMUNITY)
Admission: RE | Admit: 2022-12-24 | Discharge: 2022-12-24 | Disposition: A | Discharge status: Home / Self Care | Payer: PPO | Source: Ambulatory Visit | Attending: Cardiovascular Disease | Admitting: Cardiovascular Disease

## 2022-12-24 DIAGNOSIS — R0989 Other specified symptoms and signs involving the circulatory and respiratory systems: Secondary | ICD-10-CM | POA: Insufficient documentation

## 2022-12-28 ENCOUNTER — Ambulatory Visit: Payer: PPO

## 2022-12-28 VITALS — Ht 72.0 in | Wt 216.0 lb

## 2022-12-28 DIAGNOSIS — Z0000 Encounter for general adult medical examination without abnormal findings: Secondary | ICD-10-CM | POA: Diagnosis not present

## 2022-12-28 NOTE — Progress Notes (Signed)
Subjective:   Jeffrey Rhodes is a 82 y.o. male who presents for Medicare Annual/Subsequent preventive examination.  Visit Complete: Virtual I connected with  Jeffrey Rhodes on 12/28/22 by a audio enabled telemedicine application and verified that I am speaking with the correct person using two identifiers.  Patient Location: Home  Provider Location: Home Office  I discussed the limitations of evaluation and management by telemedicine. The patient expressed understanding and agreed to proceed.  Vital Signs: Because this visit was a virtual/telehealth visit, some criteria may be missing or patient reported. Any vitals not documented were not able to be obtained and vitals that have been documented are patient reported.    Cardiac Risk Factors include: advanced age (>85men, >58 women);male gender;hypertension     Objective:    Today's Vitals   12/28/22 1600 12/28/22 1601  Weight: 216 lb (98 kg)   Height: 6' (1.829 m)   PainSc:  0-No pain   Body mass index is 29.29 kg/m.     12/28/2022    4:06 PM 12/23/2021    2:51 PM 01/17/2017    8:45 AM 07/08/2016    9:53 AM 06/24/2016    7:43 AM 04/27/2015    7:36 PM  Advanced Directives  Does Patient Have a Medical Advance Directive? Yes Yes Yes Yes Yes No  Type of Estate agent of [place];Living will Healthcare Power of [place];Living will Healthcare Power of [place];Living will Healthcare Power of [place];Living will Healthcare Power of [place];Living will   Does patient want to make changes to medical advance directive?   Yes (MAU/Ambulatory/Procedural Areas - Information given)     Copy of Healthcare Power of Attorney in Chart? No - copy requested No - copy requested No - copy requested No - copy requested No - copy requested   Would patient like information on creating a medical advance directive?      No - patient declined information    Current Medications (verified) Outpatient Encounter Medications as of  12/28/2022  Medication Sig   acetaminophen (TYLENOL) 500 MG tablet Take 500 mg by mouth every 8 (eight) hours as needed for mild pain.    amLODipine (NORVASC) 10 MG tablet TAKE 1 TABLET BY MOUTH EVERY DAY   aspirin EC 81 MG tablet Take 81 mg by mouth daily. Swallow whole.   clobetasol cream (TEMOVATE) 0.05 % Apply topically.   fluticasone (FLONASE) 50 MCG/ACT nasal spray Place 2 sprays into both nostrils daily as needed for allergies or rhinitis.   latanoprost (XALATAN) 0.005 % ophthalmic solution Place 1 drop into both eyes at bedtime.   levothyroxine (SYNTHROID) 137 MCG tablet TAKE 1 TABLET BY MOUTH EVERY DAY BEFORE BREAKFAST   meloxicam (MOBIC) 15 MG tablet TAKE 1 TABLET (15 MG TOTAL) BY MOUTH DAILY.   metoprolol succinate (TOPROL-XL) 25 MG 24 hr tablet Take 1 tablet (25 mg total) by mouth daily.   montelukast (SINGULAIR) 10 MG tablet TAKE 1 TABLET BY MOUTH EVERYDAY AT BEDTIME (Patient not taking: Reported on 12/14/2022)   OVER THE COUNTER MEDICATION Super Beta Prostate   rosuvastatin (CRESTOR) 40 MG tablet TAKE 1 TABLET BY MOUTH EVERY DAY   tamsulosin (FLOMAX) 0.4 MG CAPS capsule TAKE 1 CAPSULE BY MOUTH EVERY DAY   traMADol (ULTRAM) 50 MG tablet TAKE 1 TABLET NIGHTLY AS NEEDED FOR PAIN.   zolpidem (AMBIEN) 10 MG tablet TAKE 1 TABLET BY MOUTH AT BEDTIME AS NEEDED FOR SLEEP   No facility-administered encounter medications on file as of 12/28/2022.  Allergies (verified) Bee venom, Lisinopril, and Losartan   History: Past Medical History:  Diagnosis Date   Allergy    Arthritis    Cataract    Coronary artery disease    GERD (gastroesophageal reflux disease)    Glaucoma    Heart murmur    High cholesterol    Hypertension    Hypothyroid    Myocardial infarction [facility])    Past Surgical History:  Procedure Laterality Date   CORONARY ARTERY BYPASS GRAFT  2014   [place]   DENTAL RESTORATION/EXTRACTION WITH X-RAY     Family History  Problem Relation Age of Onset   Breast  cancer Mother    Diabetes Brother    Heart disease Brother    Colon cancer Neg Hx    Esophageal cancer Neg Hx    Rectal cancer Neg Hx    Stomach cancer Neg Hx    Social History   Socioeconomic History   Marital status: Widowed    Spouse name: Not on file   Number of children: 2   Years of education: Not on file   Highest education level: Not on file  Occupational History   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  Tobacco Use   Smoking status: Former    Types: Cigarettes   Smokeless tobacco: Never   Tobacco comments:    2003 quit  Vaping Use   Vaping status: Never Used  Substance and Sexual Activity   Alcohol use: Yes    Alcohol/week: 0.0 standard drinks of alcohol    Comment: occasional   Drug use: No   Sexual activity: Not on file  Other Topics Concern   Not on file  Social History Narrative   Not on file   Social Determinants of Health   Financial Resource Strain: Low Risk  (12/28/2022)   Overall Financial Resource Strain (CARDIA)    Difficulty of Paying Living Expenses: Not hard at all  Food Insecurity: No Food Insecurity (12/28/2022)   Hunger Vital Sign    Worried About Running Out of Food in the Last Year: Never true    Ran Out of Food in the Last Year: Never true  Transportation Needs: No Transportation Needs (12/28/2022)   PRAPARE - Administrator, Civil Service (Medical): No    Lack of Transportation (Non-Medical): No  Physical Activity: Insufficiently Active (12/28/2022)   Exercise Vital Sign    Days of Exercise per Week: 7 days    Minutes of Exercise per Session: 20 min  Stress: No Stress Concern Present (12/28/2022)   [employer] of Occupational Health - Occupational Stress Questionnaire    Feeling of Stress : Not at all  Social Connections: Socially Isolated (12/28/2022)   Social Connection and Isolation Panel [NHANES]    Frequency of Communication with Friends and Family: More than three times a week    Frequency of Social Gatherings with  Friends and Family: More than three times a week    Attends Religious Services: Never    Database administrator or Organizations: No    Attends Banker Meetings: Never    Marital Status: Widowed    Tobacco Counseling Counseling given: Not Answered Tobacco comments: 2003 quit   Clinical Intake:  Pre-visit preparation completed: Yes  Pain : No/denies pain Pain Score: 0-No pain     BMI - recorded: 29.29 Nutritional Status: BMI 25 -29 Overweight Nutritional Risks: None Diabetes: No  How often do you need to have someone help you when you read  instructions, pamphlets, or other written materials from your doctor or pharmacy?: 1 - Never  Interpreter Needed?: No  Information entered by :: Theresa Mulligan LPN   Activities of Daily Living    12/28/2022    4:06 PM  In your present state of health, do you have any difficulty performing the following activities:  Hearing? 0  Vision? 0  Difficulty concentrating or making decisions? 0  Walking or climbing stairs? 0  Dressing or bathing? 0  Doing errands, shopping? 0  Preparing Food and eating ? N  Using the Toilet? N  In the past six months, have you accidently leaked urine? N  Do you have problems with loss of bowel control? N  Managing your Medications? N  Managing your Finances? N  Housekeeping or managing your Housekeeping? N    Patient Care Team: Sharlene Dory, DO as PCP - General (Family Medicine) Jens Som Madolyn Frieze, MD as PCP - Cardiology (Cardiology)  Indicate any recent Medical Services you may have received from other than [facility] providers in the past year (date may be approximate).     Assessment:   This is a routine wellness examination for [place].  Hearing/Vision screen Hearing Screening - Comments:: Denies hearing difficulties   Vision Screening - Comments:: Wears rx glasses - up to date with routine eye exams with  [facility]   Goals Addressed               This Visit's  Progress     Patient Stated (pt-stated)        Stay active and healthy!       Depression Screen    12/28/2022    4:05 PM 12/23/2021    2:50 PM 12/15/2020    7:02 AM 06/18/2019    7:32 AM 10/01/2016    7:34 AM  PHQ 2/9 Scores  PHQ - 2 Score 0 0 0 0 0  PHQ- 9 Score   0  4    Fall Risk    12/28/2022    4:06 PM 03/16/2022    4:25 PM 12/23/2021    2:53 PM 06/09/2020    7:21 AM 01/17/2019    9:45 AM  Fall Risk   Falls in the past year? 0 0 0 0 0  Comment     Emmi Telephone Survey: data to providers prior to load  Number falls in past yr: 0 0 0 0   Injury with Fall? 0 0 0 0   Risk for fall due to : No Fall Risks  Impaired vision    Follow up Falls prevention discussed  Falls prevention discussed      MEDICARE RISK AT HOME: Medicare Risk at Home Any stairs in or around the home?: No If so, are there any without handrails?: No Home free of loose throw rugs in walkways, pet beds, electrical cords, etc?: Yes Adequate lighting in your home to reduce risk of falls?: Yes Life alert?: No Use of a cane, walker or w/c?: No Grab bars in the bathroom?: Yes Shower chair or bench in shower?: Yes Elevated toilet seat or a handicapped toilet?: Yes  TIMED UP AND GO:  Was the test performed?  No    Cognitive Function:        12/28/2022    4:07 PM 12/23/2021    2:54 PM  6CIT Screen  What Year? -- 0 points  What month? -- 0 points  What time? -- 0 points  Count back from 20 -- 0  points  Months in reverse -- 4 points  Repeat phrase -- 0 points  Total Score  4 points    Immunizations Immunization History  Administered Date(s) Administered   Fluad Quad(high Dose 65+) 12/12/2019, 12/15/2020, 12/30/2021   Influenza, High Dose Seasonal PF 12/12/2017   Moderna Sars-Covid-2 Vaccination 04/24/2019, 05/23/2019   PNEUMOCOCCAL CONJUGATE-20 12/15/2020   Tdap 12/12/2017   Zoster Recombinant(Shingrix) 06/10/2017, 08/15/2017    TDAP status: Up to date  Flu Vaccine status: Due, Education  has been provided regarding the importance of this vaccine. Advised may receive this vaccine at local pharmacy or Health Dept. Aware to provide a copy of the vaccination record if obtained from local pharmacy or Health Dept. Verbalized acceptance and understanding.  Pneumococcal vaccine status: Up to date    Qualifies for Shingles Vaccine? Yes   Zostavax completed Yes   Shingrix Completed?: Yes  Screening Tests Health Maintenance  Topic Date Due   INFLUENZA VACCINE  09/23/2022   Medicare Annual Wellness (AWV)  12/28/2023   DTaP/Tdap/Td (2 - Td or Tdap) 12/13/2027   Pneumonia Vaccine 78+ Years old  Completed   Hepatitis C Screening  Completed   Zoster Vaccines- Shingrix  Completed   HPV VACCINES  Aged Out   Colonoscopy  Discontinued   COVID-19 Vaccine  Discontinued    Health Maintenance  Health Maintenance Due  Topic Date Due   INFLUENZA VACCINE  09/23/2022         Additional Screening:  Hepatitis C Screening: does qualify; Completed 12/12/19  Vision Screening: Recommended annual ophthalmology exams for early detection of glaucoma and other disorders of the eye. Is the patient up to date with their annual eye exam?  Yes  Who is the provider or what is the name of the office in which the patient attends annual eye exams? [facility] If pt is not established with a provider, would they like to be referred to a provider to establish care? No .   Dental Screening: Recommended annual dental exams for proper oral hygiene    Community Resource Referral / Chronic Care Management:  CRR required this visit?  No   CCM required this visit?  No     Plan:     I have personally reviewed and noted the following in the patient's chart:   Medical and social history Use of alcohol, tobacco or illicit drugs  Current medications and supplements including opioid prescriptions. Patient is currently taking opioid prescriptions. Information provided to patient regarding  non-opioid alternatives. Patient advised to discuss non-opioid treatment plan with their provider. Functional ability and status Nutritional status Physical activity Advanced directives List of other physicians Hospitalizations, surgeries, and ER visits in previous 12 months Vitals Screenings to include cognitive, depression, and falls Referrals and appointments  In addition, I have reviewed and discussed with patient certain preventive protocols, quality metrics, and best practice recommendations. A written personalized care plan for preventive services as well as general preventive health recommendations were provided to patient.     Tillie Rung, LPN   [id number]/02/[id number]   After Visit Summary: (MyChart) Due to this being a telephonic visit, the after visit summary with patients personalized plan was offered to patient via MyChart   Nurse Notes: None

## 2022-12-28 NOTE — Patient Instructions (Addendum)
Jeffrey Rhodes , Thank you for taking time to come for your Medicare Wellness Visit. I appreciate your ongoing commitment to your health goals. Please review the following plan we discussed and let me know if I can assist you in the future.   Referrals/Orders/Follow-Ups/Clinician Recommendations:   This is a list of the screening recommended for you and due dates:  Health Maintenance  Topic Date Due   Flu Shot  09/23/2022   Medicare Annual Wellness Visit  12/28/2023   DTaP/Tdap/Td vaccine (2 - Td or Tdap) 12/13/2027   Pneumonia Vaccine  Completed   Hepatitis C Screening  Completed   Zoster (Shingles) Vaccine  Completed   HPV Vaccine  Aged Out   Colon Cancer Screening  Discontinued   COVID-19 Vaccine  Discontinued   Opioid Pain Medicine Management Opioids are powerful medicines that are used to treat moderate to severe pain. When used for short periods of time, they can help you to: Sleep better. Do better in physical or occupational therapy. Feel better in the first few days after an injury. Recover from surgery. Opioids should be taken with the supervision of a trained health care provider. They should be taken for the shortest period of time possible. This is because opioids can be addictive, and the longer you take opioids, the greater your risk of addiction. This addiction can also be called opioid use disorder. What are the risks? Using opioid pain medicines for longer than 3 days increases your risk of side effects. Side effects include: Constipation. Nausea and vomiting. Breathing difficulties (respiratory depression). Drowsiness. Confusion. Opioid use disorder. Itching. Taking opioid pain medicine for a long period of time can affect your ability to do daily tasks. It also puts you at risk for: Motor vehicle crashes. Depression. Suicide. Heart attack. Overdose, which can be life-threatening. What is a pain treatment plan? A pain treatment plan is an agreement between you  and your health care provider. Pain is unique to each person, and treatments vary depending on your condition. To manage your pain, you and your health care provider need to work together. To help you do this: Discuss the goals of your treatment, including how much pain you might expect to have and how you will manage the pain. Review the risks and benefits of taking opioid medicines. Remember that a good treatment plan uses more than one approach and minimizes the chance of side effects. Be honest about the amount of medicines you take and about any drug or alcohol use. Get pain medicine prescriptions from only one health care provider. Pain can be managed with many types of alternative treatments. Ask your health care provider to refer you to one or more specialists who can help you manage pain through: Physical or occupational therapy. Counseling (cognitive behavioral therapy). Good nutrition. Biofeedback. Massage. Meditation. Non-opioid medicine. Following a gentle exercise program. How to use opioid pain medicine Taking medicine Take your pain medicine exactly as told by your health care provider. Take it only when you need it. If your pain gets less severe, you may take less than your prescribed dose if your health care provider approves. If you are not having pain, do nottake pain medicine unless your health care provider tells you to take it. If your pain is severe, do nottry to treat it yourself by taking more pills than instructed on your prescription. Contact your health care provider for help. Write down the times when you take your pain medicine. It is easy to become confused while  on pain medicine. Writing the time can help you avoid overdose. Take other over-the-counter or prescription medicines only as told by your health care provider. Keeping yourself and others safe  While you are taking opioid pain medicine: Do not drive, use machinery, or power tools. Do not sign legal  documents. Do not drink alcohol. Do not take sleeping pills. Do not supervise children by yourself. Do not do activities that require climbing or being in high places. Do not go to a lake, river, ocean, spa, or swimming pool. Do not share your pain medicine with anyone. Keep pain medicine in a locked cabinet or in a secure area where pets and children cannot reach it. Stopping your use of opioids If you have been taking opioid medicine for more than a few weeks, you may need to slowly decrease (taper) how much you take until you stop completely. Tapering your use of opioids can decrease your risk of symptoms of withdrawal, such as: Pain and cramping in the abdomen. Nausea. Sweating. Sleepiness. Restlessness. Uncontrollable shaking (tremors). Cravings for the medicine. Do not attempt to taper your use of opioids on your own. Talk with your health care provider about how to do this. Your health care provider may prescribe a step-down schedule based on how much medicine you are taking and how long you have been taking it. Getting rid of leftover pills Do not save any leftover pills. Get rid of leftover pills safely by: Taking the medicine to a prescription take-back program. This is usually offered by the county or law enforcement. Bringing them to a pharmacy that has a drug disposal container. Flushing them down the toilet. Check the label or package insert of your medicine to see whether this is safe to do. Throwing them out in the trash. Check the label or package insert of your medicine to see whether this is safe to do. If it is safe to throw it out, remove the medicine from the original container, put it into a sealable bag or container, and mix it with used coffee grounds, food scraps, dirt, or cat litter before putting it in the trash. Follow these instructions at home: Activity Do exercises as told by your health care provider. Avoid activities that make your pain worse. Return to  your normal activities as told by your health care provider. Ask your health care provider what activities are safe for you. General instructions You may need to take these actions to prevent or treat constipation: Drink enough fluid to keep your urine pale yellow. Take over-the-counter or prescription medicines. Eat foods that are high in fiber, such as beans, whole grains, and fresh fruits and vegetables. Limit foods that are high in fat and processed sugars, such as fried or sweet foods. Keep all follow-up visits. This is important. Where to find support If you have been taking opioids for a long time, you may benefit from receiving support for quitting from a local support group or counselor. Ask your health care provider for a referral to these resources in your area. Where to find more information Centers for Disease Control and Prevention (CDC): FootballExhibition.com.br [place] Food and Drug Administration (FDA): PumpkinSearch.com.ee Get help right away if: You may have taken too much of an opioid (overdosed). Common symptoms of an overdose: Your breathing is slower or more shallow than normal. You have a very slow heartbeat (pulse). You have slurred speech. You have nausea and vomiting. Your pupils become very small. You have other potential symptoms: You are very  confused. You faint or feel like you will faint. You have cold, clammy skin. You have blue lips or fingernails. You have thoughts of harming yourself or harming others. These symptoms may represent a serious problem that is an emergency. Do not wait to see if the symptoms will go away. Get medical help right away. Call your local emergency services (911 in the [place] Do not drive yourself to the hospital.  If you ever feel like you may hurt yourself or others, or have thoughts about taking your own life, get help right away. Go to your nearest emergency department or: Call your local emergency services (911 in the [place] Call the [facility] ((541)123-2907 in the [place] Call a suicide crisis helpline, such as the National Suicide Prevention Lifeline at (714)445-0866 or 988 in the [place] This is open 24 hours a day in the [place] Text the Crisis Text Line at 918-391-7755 (in the [place] Summary Opioid medicines can help you manage moderate to severe pain for a short period of time. A pain treatment plan is an agreement between you and your health care provider. Discuss the goals of your treatment, including how much pain you might expect to have and how you will manage the pain. If you think that you or someone else may have taken too much of an opioid, get medical help right away. This information is not intended to replace advice given to you by your health care provider. Make sure you discuss any questions you have with your health care provider. Document Revised: 09/03/2020 Document Reviewed: 05/21/2020 Elsevier Patient Education  2024 [employer]  Advanced directives: (Copy Requested) Please bring a copy of your health care power of attorney and living will to the office to be added to your chart at your convenience.  Next Medicare Annual Wellness Visit scheduled for next year: Yes

## 2023-01-03 ENCOUNTER — Encounter: Payer: Self-pay | Admitting: Family Medicine

## 2023-01-03 ENCOUNTER — Ambulatory Visit (INDEPENDENT_AMBULATORY_CARE_PROVIDER_SITE_OTHER): Payer: PPO | Admitting: Family Medicine

## 2023-01-03 VITALS — BP 128/64 | HR 69 | Temp 98.0°F | Resp 16 | Ht 73.0 in | Wt 218.2 lb

## 2023-01-03 DIAGNOSIS — Z23 Encounter for immunization: Secondary | ICD-10-CM | POA: Diagnosis not present

## 2023-01-03 DIAGNOSIS — E039 Hypothyroidism, unspecified: Secondary | ICD-10-CM | POA: Diagnosis not present

## 2023-01-03 DIAGNOSIS — Z0001 Encounter for general adult medical examination with abnormal findings: Secondary | ICD-10-CM

## 2023-01-03 DIAGNOSIS — G8929 Other chronic pain: Secondary | ICD-10-CM

## 2023-01-03 DIAGNOSIS — Z0000 Encounter for general adult medical examination without abnormal findings: Secondary | ICD-10-CM

## 2023-01-03 DIAGNOSIS — M25511 Pain in right shoulder: Secondary | ICD-10-CM | POA: Diagnosis not present

## 2023-01-03 DIAGNOSIS — I10 Essential (primary) hypertension: Secondary | ICD-10-CM | POA: Diagnosis not present

## 2023-01-03 DIAGNOSIS — M25512 Pain in left shoulder: Secondary | ICD-10-CM

## 2023-01-03 LAB — COMPREHENSIVE METABOLIC PANEL
ALT: 14 U/L (ref 0–53)
AST: 18 U/L (ref 0–37)
Albumin: 4.3 g/dL (ref 3.5–5.2)
Alkaline Phosphatase: 58 U/L (ref 39–117)
BUN: 14 mg/dL (ref 6–23)
CO2: 29 meq/L (ref 19–32)
Calcium: 9.6 mg/dL (ref 8.4–10.5)
Chloride: 101 meq/L (ref 96–112)
Creatinine, Ser: 0.91 mg/dL (ref 0.40–1.50)
GFR: 79.95 mL/min (ref >60.00)
Glucose, Bld: 105 mg/dL — ABNORMAL HIGH (ref 70–99)
Potassium: 4.6 meq/L (ref 3.5–5.1)
Sodium: 138 meq/L (ref 135–145)
Total Bilirubin: 0.5 mg/dL (ref 0.2–1.2)
Total Protein: 7.8 g/dL (ref 6.0–8.3)

## 2023-01-03 LAB — CBC
HCT: 39.6 % (ref 39.0–52.0)
Hemoglobin: 13.5 g/dL (ref 13.0–17.0)
MCHC: 34 g/dL (ref 30.0–36.0)
MCV: 92.1 fL (ref 78.0–100.0)
Platelets: 253 10*3/uL (ref 150.0–400.0)
RBC: 4.3 Mil/uL (ref 4.22–5.81)
RDW: 13.3 % (ref 11.5–15.5)
WBC: 8.9 10*3/uL (ref 4.0–10.5)

## 2023-01-03 LAB — LIPID PANEL
Cholesterol: 138 mg/dL (ref 0–200)
HDL: 42.9 mg/dL (ref >39.00)
LDL Cholesterol: 65 mg/dL (ref 0–99)
NonHDL: 95.27
Total CHOL/HDL Ratio: 3
Triglycerides: 153 mg/dL — ABNORMAL HIGH (ref 0.0–149.0)
VLDL: 30.6 mg/dL (ref 0.0–40.0)

## 2023-01-03 LAB — T4, FREE: Free T4: 0.97 ng/dL (ref 0.60–1.60)

## 2023-01-03 LAB — TSH: TSH: 0.26 u[IU]/mL — ABNORMAL LOW (ref 0.35–5.50)

## 2023-01-03 MED ORDER — METOPROLOL SUCCINATE ER 25 MG PO TB24: 25.0000 mg | ORAL_TABLET | Freq: Every day | ORAL | 3 refills | Status: DC

## 2023-01-03 MED ORDER — METHYLPREDNISOLONE ACETATE 40 MG/ML IJ SUSP
40.0000 mg | Freq: Once | INTRAMUSCULAR | Status: AC
  Administered 2023-01-03: 40 mg via INTRAMUSCULAR

## 2023-01-03 MED ORDER — MONTELUKAST SODIUM 10 MG PO TABS: 10.0000 mg | ORAL_TABLET | Freq: Every day | ORAL | 1 refills | Status: DC

## 2023-01-03 MED ORDER — TRAMADOL HCL 50 MG PO TABS: ORAL_TABLET | ORAL | 1 refills | Status: DC

## 2023-01-03 MED ORDER — LEVOTHYROXINE SODIUM 137 MCG PO TABS: ORAL_TABLET | ORAL | 1 refills | Status: DC

## 2023-01-03 NOTE — Patient Instructions (Addendum)
Give [place] 2-3 business days to get the results of your labs back.   Keep the diet clean and stay active.  Foods that may reduce pain: 1) Ginger 2) Blueberries 3) Salmon 4) Pumpkin seeds 5) Dark chocolate 6) Turmeric 7) Tart cherries 8) Virgin olive oil 9) Chili peppers 10) Mint 11) Krill oil  Let us know if you need anything.  Trapezius stretches/exercises Do exercises exactly as told by your health care provider and adjust them as directed. It is normal to feel mild stretching, pulling, tightness, or discomfort as you do these exercises, but you should stop right away if you feel sudden pain or your pain gets worse.   Stretching and range of motion exercises These exercises warm up your muscles and joints and improve the movement and flexibility of your shoulder. These exercises can also help to relieve pain, numbness, and tingling. If you are unable to do any of the following for any reason, do not further attempt to do it.   Exercise A: Flexion, standing     Stand and hold a broomstick, a cane, or a similar object. Place your hands a little more than shoulder-width apart on the object. Your left / right hand should be palm-up, and your other hand should be palm-down. Push the stick to raise your left / right arm out to your side and then over your head. Use your other hand to help move the stick. Stop when you feel a stretch in your shoulder, or when you reach the angle that is recommended by your health care provider. Avoid shrugging your shoulder while you raise your arm. Keep your shoulder blade tucked down toward your spine. Hold for 30 seconds. Slowly return to the starting position. Repeat 2 times. Complete this exercise 3 times per week.  Exercise B: Abduction, supine     Lie on your back and hold a broomstick, a cane, or a similar object. Place your hands a little more than shoulder-width apart on the object. Your left / right hand should be palm-up, and your other hand  should be palm-down. Push the stick to raise your left / right arm out to your side and then over your head. Use your other hand to help move the stick. Stop when you feel a stretch in your shoulder, or when you reach the angle that is recommended by your health care provider. Avoid shrugging your shoulder while you raise your arm. Keep your shoulder blade tucked down toward your spine. Hold for 30 seconds. Slowly return to the starting position. Repeat 2 times. Complete this exercise 3 times per week.  Exercise C: Flexion, active-assisted     Lie on your back. You may bend your knees for comfort. Hold a broomstick, a cane, or a similar object. Place your hands about shoulder-width apart on the object. Your palms should face toward your feet. Raise the stick and move your arms over your head and behind your head, toward the floor. Use your healthy arm to help your left / right arm move farther. Stop when you feel a gentle stretch in your shoulder, or when you reach the angle where your health care provider tells you to stop. Hold for 30 seconds. Slowly return to the starting position. Repeat 2 times. Complete this exercise 3 times per week.  Exercise D: External rotation and abduction     Stand in a door frame with one of your feet slightly in front of the other. This is called a staggered  stance. Choose one of the following positions as told by your health care provider: Place your hands and forearms on the door frame above your head. Place your hands and forearms on the door frame at the height of your head. Place your hands on the door frame at the height of your elbows. Slowly move your weight onto your front foot until you feel a stretch across your chest and in the front of your shoulders. Keep your head and chest upright and keep your abdominal muscles tight. Hold for 30 seconds. To release the stretch, shift your weight to your back foot. Repeat 2 times. Complete this stretch 3  times per week.  Strengthening exercises These exercises build strength and endurance in your shoulder. Endurance is the ability to use your muscles for a long time, even after your muscles get tired. Exercise E: Scapular depression and adduction  Sit on a stable chair. Support your arms in front of you with pillows, armrests, or a tabletop. Keep your elbows in line with the sides of your body. Gently move your shoulder blades down toward your middle back. Relax the muscles on the tops of your shoulders and in the back of your neck. Hold for 3 seconds. Slowly release the tension and relax your muscles completely before doing this exercise again. Repeat for a total of 10 repetitions. After you have practiced this exercise, try doing the exercise without the arm support. Then, try the exercise while standing instead of sitting. Repeat 2 times. Complete this exercise 3 times per week.  Exercise F: Shoulder abduction, isometric     Stand or sit about 4-6 inches (10-15 cm) from a wall with your left / right side facing the wall. Bend your left / right elbow and gently press your elbow against the wall. Increase the pressure slowly until you are pressing as hard as you can without shrugging your shoulder. Hold for 3 seconds. Slowly release the tension and relax your muscles completely. Repeat for a total of 10 repetitions. Repeat 2 times. Complete this exercise 3 times per week.  Exercise G: Shoulder flexion, isometric     Stand or sit about 4-6 inches (10-15 cm) away from a wall with your left / right side facing the wall. Keep your left / right elbow straight and gently press the top of your fist against the wall. Increase the pressure slowly until you are pressing as hard as you can without shrugging your shoulder. Hold for 10-15 seconds. Slowly release the tension and relax your muscles completely. Repeat for a total of 10 repetitions. Repeat 2 times. Complete this exercise 3 times per  week.  Exercise H: Internal rotation     Sit in a stable chair without armrests, or stand. Secure an exercise band at your left / right side, at elbow height. Place a soft object, such as a folded towel or a small pillow, under your left / right upper arm so your elbow is a few inches (about 8 cm) away from your side. Hold the end of the exercise band so the band stretches. Keeping your elbow pressed against the soft object under your arm, move your forearm across your body toward your abdomen. Keep your body steady so the movement is only coming from your shoulder. Hold for 3 seconds. Slowly return to the starting position. Repeat for a total of 10 repetitions. Repeat 2 times. Complete this exercise 3 times per week.  Exercise I: External rotation     Sit in  a stable chair without armrests, or stand. Secure an exercise band at your left / right side, at elbow height. Place a soft object, such as a folded towel or a small pillow, under your left / right upper arm so your elbow is a few inches (about 8 cm) away from your side. Hold the end of the exercise band so the band stretches. Keeping your elbow pressed against the soft object under your arm, move your forearm out, away from your abdomen. Keep your body steady so the movement is only coming from your shoulder. Hold for 3 seconds. Slowly return to the starting position. Repeat for a total of 10 repetitions. Repeat 2 times. Complete this exercise 3 times per week. Exercise J: Shoulder extension  Sit in a stable chair without armrests, or stand. Secure an exercise band to a stable object in front of you so the band is at shoulder height. Hold one end of the exercise band in each hand. Your palms should face each other. Straighten your elbows and lift your hands up to shoulder height. Step back, away from the secured end of the exercise band, until the band stretches. Squeeze your shoulder blades together and pull your hands down to the  sides of your thighs. Stop when your hands are straight down by your sides. Do not let your hands go behind your body. Hold for 3 seconds. Slowly return to the starting position. Repeat for a total of 10 repetitions. Repeat 2 times. Complete this exercise 3 times per week.  Exercise K: Shoulder extension, prone     Lie on your abdomen on a firm surface so your left / right arm hangs over the edge. Hold a 5 lb weight in your hand so your palm faces in toward your body. Your arm should be straight. Squeeze your shoulder blade down toward the middle of your back. Slowly raise your arm behind you, up to the height of the surface that you are lying on. Keep your arm straight. Hold for 3 seconds. Slowly return to the starting position and relax your muscles. Repeat for a total of 10 repetitions. Repeat 2 times. Complete this exercise 3 times per week.   Exercise L: Horizontal abduction, prone  Lie on your abdomen on a firm surface so your left / right arm hangs over the edge. Hold a 5 lb weight in your hand so your palm faces toward your feet. Your arm should be straight. Squeeze your shoulder blade down toward the middle of your back. Bend your elbow so your hand moves up, until your elbow is bent to an "L" shape (90 degrees). With your elbow bent, slowly move your forearm forward and up. Raise your hand up to the height of the surface that you are lying on. Your upper arm should not move, and your elbow should stay bent. At the top of the movement, your palm should face the floor. Hold for 3 seconds. Slowly return to the starting position and relax your muscles. Repeat for a total of 10 repetitions. Repeat 2 times. Complete this exercise 3 times per week.  Exercise M: Horizontal abduction, standing  Sit on a stable chair, or stand. Secure an exercise band to a stable object in front of you so the band is at shoulder height. Hold one end of the exercise band in each hand. Straighten your  elbows and lift your hands straight in front of you, up to shoulder height. Your palms should face down, toward the floor.  Step back, away from the secured end of the exercise band, until the band stretches. Move your arms out to your sides, and keep your arms straight. Hold for 3 seconds. Slowly return to the starting position. Repeat for a total of 10 repetitions. Repeat 2 times. Complete this exercise 3 times per week.  Exercise N: Scapular retraction and elevation  Sit on a stable chair, or stand. Secure an exercise band to a stable object in front of you so the band is at shoulder height. Hold one end of the exercise band in each hand. Your palms should face each other. Sit in a stable chair without armrests, or stand. Step back, away from the secured end of the exercise band, until the band stretches. Squeeze your shoulder blades together and lift your hands over your head. Keep your elbows straight. Hold for 3 seconds. Slowly return to the starting position. Repeat for a total of 10 repetitions. Repeat 2 times. Complete this exercise 3 times per week.  This information is not intended to replace advice given to you by your health care provider. Make sure you discuss any questions you have with your health care provider. Document Released: 02/08/2005 Document Revised: 10/16/2015 Document Reviewed: 12/26/2014 Elsevier Interactive Patient Education  2017 [employer].

## 2023-01-03 NOTE — Progress Notes (Signed)
Chief Complaint  Patient presents with   Annual Exam    Annual Exam    Well Male Jeffrey Rhodes is here for a complete physical.   His last physical was >1 year ago.  Current diet: in general, diet is fair.   Current exercise: walking Weight trend: stable Fatigue out of ordinary? No. Seat belt? Yes.   Advanced directive? Yes  Health maintenance Shingrix- Yes Tetanus- Yes Hep C- Yes Pneumonia vaccine- Yes AAA screening- Yes  Chronic left shoulder pain Hx of this, has not had in over a year. Lasts for several months before wearing off. Takes tramadol prn which helps a bit. No recent injury or change in activity. Some loss of ROM. No bruising, redness, swelling, neuro s/s's.    Past Medical History:  Diagnosis Date   Allergy    Arthritis    Cataract    Coronary artery disease    GERD (gastroesophageal reflux disease)    Glaucoma    Heart murmur    High cholesterol    Hypertension    Hypothyroid    Myocardial infarction [facility])      Past Surgical History:  Procedure Laterality Date   CORONARY ARTERY BYPASS GRAFT  2014   [place]   DENTAL RESTORATION/EXTRACTION WITH X-RAY      Medications  Current Outpatient Medications on File Prior to Visit  Medication Sig Dispense Refill   acetaminophen (TYLENOL) 500 MG tablet Take 500 mg by mouth every 8 (eight) hours as needed for mild pain.      amLODipine (NORVASC) 10 MG tablet TAKE 1 TABLET BY MOUTH EVERY DAY 90 tablet 2   aspirin EC 81 MG tablet Take 81 mg by mouth daily. Swallow whole.     clobetasol cream (TEMOVATE) 0.05 % Apply topically.     fluticasone (FLONASE) 50 MCG/ACT nasal spray Place 2 sprays into both nostrils daily as needed for allergies or rhinitis. 48 mL 3   latanoprost (XALATAN) 0.005 % ophthalmic solution Place 1 drop into both eyes at bedtime.     levothyroxine (SYNTHROID) 137 MCG tablet TAKE 1 TABLET BY MOUTH EVERY DAY BEFORE BREAKFAST 90 tablet 1   meloxicam (MOBIC) 15 MG tablet TAKE 1 TABLET (15 MG  TOTAL) BY MOUTH DAILY. 30 tablet 3   metoprolol succinate (TOPROL-XL) 25 MG 24 hr tablet Take 1 tablet (25 mg total) by mouth daily. 90 tablet 3   montelukast (SINGULAIR) 10 MG tablet TAKE 1 TABLET BY MOUTH EVERYDAY AT BEDTIME 90 tablet 1   OVER THE COUNTER MEDICATION Super Beta Prostate     rosuvastatin (CRESTOR) 40 MG tablet TAKE 1 TABLET BY MOUTH EVERY DAY 90 tablet 3   tamsulosin (FLOMAX) 0.4 MG CAPS capsule TAKE 1 CAPSULE BY MOUTH EVERY DAY 90 capsule 3   traMADol (ULTRAM) 50 MG tablet TAKE 1 TABLET NIGHTLY AS NEEDED FOR PAIN. 90 tablet 1   zolpidem (AMBIEN) 10 MG tablet TAKE 1 TABLET BY MOUTH AT BEDTIME AS NEEDED FOR SLEEP 30 tablet 5   Allergies Allergies  Allergen Reactions   Bee Venom Other (See Comments)    unknown unknown    Lisinopril Other (See Comments)    cough Other reaction(s): COUGH Other reaction(s): COUGH cough Other reaction(s): COUGH cough Other reaction(s): COUGH Other reaction(s): COUGH cough Other reaction(s): COUGH Other reaction(s): COUGH cough    Losartan Cough    Family History Family History  Problem Relation Age of Onset   Breast cancer Mother    Diabetes Brother  Heart disease Brother    Colon cancer Neg Hx    Esophageal cancer Neg Hx    Rectal cancer Neg Hx    Stomach cancer Neg Hx     Review of Systems: Constitutional:  no fevers Eye:  no recent significant change in vision Ears:  No changes in hearing Nose/Mouth/Throat:  no complaints of nasal congestion, no sore throat Cardiovascular: no chest pain Respiratory:  No shortness of breath Gastrointestinal:  No change in bowel habits GU:  No frequency Integumentary:  no abnormal skin lesions reported MSK: +B/l shoulder pain, worse on L Neurologic:  no headaches Endocrine:  denies unexplained weight changes  Exam BP 128/64 (BP Location: Left Arm, Patient Position: Sitting, Cuff Size: Normal)   Pulse 69   Temp 98 F (36.7 C) (Oral)   Resp 16   Ht 6\' 1"  (1.854 m)   Wt 218  lb 3.2 oz (99 kg)   SpO2 95%   BMI 28.79 kg/m  General:  well developed, well nourished, in no apparent distress Skin:  no significant moles, warts, or growths Head:  no masses, lesions, or tenderness Eyes:  pupils equal and round, sclera anicteric without injection Ears:  canals without lesions, TMs shiny without retraction, no obvious effusion, no erythema Nose:  nares patent, mucosa normal Throat/Pharynx:  lips and gingiva without lesion; tongue and uvula midline; non-inflamed pharynx; no exudates or postnasal drainage Lungs:  clear to auscultation, breath sounds equal bilaterally, no respiratory distress Cardio:  regular rate and rhythm, no LE edema or bruits Rectal: Deferred GI: BS+, S, NT, ND, no masses or organomegaly Musculoskeletal: Decreased active and passive range of motion of the left shoulder, mild TTP over the left trapezius musculature, positive Neer's, Hawkins, empty can on the left, negative crossover, speeds, O'Brien's; no other TTP or deformity Neuro:  gait normal; deep tendon reflexes normal and symmetric Psych: well oriented with normal range of affect and appropriate judgment/insight  Procedure Note; Shoulder bursa injection Verbal consent obtained. The area was palpated, an area was marked just caudal to the acromion process laterally, and cleaned with alcohol x1. Topical freeze spray was used for anesthesia. A 27-gauge needle was used to enter the joint laterally with ease. 40 mg of Depomedrol with 2 mL of 1% lidocaine was injected. A bandage was placed.  The patient tolerated the procedure well. There were no complications noted.  Assessment and Plan  Well adult exam  Essential hypertension, benign - Plan: CBC, Comprehensive metabolic panel, Lipid panel, metoprolol succinate (TOPROL-XL) 25 MG 24 hr tablet  Hypothyroidism, unspecified type - Plan: TSH, T4, free, levothyroxine (SYNTHROID) 137 MCG tablet  Chronic pain of both shoulders - Plan: traMADol  (ULTRAM) 50 MG tablet, methylPREDNISolone acetate (DEPO-MEDROL) injection 40 mg  Need for influenza vaccination - Plan: Flu Vaccine Trivalent High Dose (Fluad)   Well 82 y.o. male. Counseled on diet and exercise. Advanced directive form obtained today.  Flu shot today.  Chronic shoulder pain: Chronic, uncontrolled.  Refill tramadol as this has been helpful historically.  Tylenol as needed, heat, ice, stretches and exercises.  Steroid injection today as this has historically worked very well for him. Follow up in 6 mo.  The patient voiced understanding and agreement to the plan.  Jilda Roche Walters, DO 01/03/23 7:26 AM

## 2023-01-07 ENCOUNTER — Ambulatory Visit (HOSPITAL_BASED_OUTPATIENT_CLINIC_OR_DEPARTMENT_OTHER): Payer: PPO

## 2023-01-07 DIAGNOSIS — I350 Nonrheumatic aortic (valve) stenosis: Secondary | ICD-10-CM | POA: Diagnosis not present

## 2023-01-07 LAB — ECHOCARDIOGRAM COMPLETE
AR max vel: 1.36 cm2
AV Area VTI: 1.38 cm2
AV Area mean vel: 1.14 cm2
AV Mean grad: 14 mm[Hg]
AV Peak grad: 21.9 mm[Hg]
Ao pk vel: 2.34 m/s
Area-P 1/2: 4.21 cm2
MV M vel: 4.99 m/s
MV Peak grad: 99.6 mm[Hg]
S' Lateral: 3.86 cm

## 2023-01-13 ENCOUNTER — Other Ambulatory Visit: Payer: Self-pay | Admitting: *Deleted

## 2023-01-13 ENCOUNTER — Encounter: Payer: Self-pay | Admitting: *Deleted

## 2023-01-13 DIAGNOSIS — I350 Nonrheumatic aortic (valve) stenosis: Secondary | ICD-10-CM

## 2023-02-04 ENCOUNTER — Other Ambulatory Visit: Payer: Self-pay | Admitting: Family Medicine

## 2023-03-05 ENCOUNTER — Other Ambulatory Visit: Payer: Self-pay | Admitting: Family Medicine

## 2023-03-12 ENCOUNTER — Other Ambulatory Visit: Payer: Self-pay | Admitting: Family Medicine

## 2023-03-20 ENCOUNTER — Ambulatory Visit
Admission: EM | Admit: 2023-03-20 | Discharge: 2023-03-20 | Disposition: A | Discharge status: Home / Self Care | Payer: PPO | Attending: Family Medicine | Admitting: Family Medicine

## 2023-03-20 ENCOUNTER — Encounter: Payer: Self-pay | Admitting: Emergency Medicine

## 2023-03-20 DIAGNOSIS — S86912A Strain of unspecified muscle(s) and tendon(s) at lower leg level, left leg, initial encounter: Secondary | ICD-10-CM | POA: Diagnosis not present

## 2023-03-20 DIAGNOSIS — M5442 Lumbago with sciatica, left side: Secondary | ICD-10-CM | POA: Diagnosis not present

## 2023-03-20 MED ORDER — KETOROLAC TROMETHAMINE 30 MG/ML IJ SOLN
15.0000 mg | Freq: Once | INTRAMUSCULAR | Status: AC
  Administered 2023-03-20: 15 mg via INTRAMUSCULAR

## 2023-03-20 MED ORDER — PREDNISONE 20 MG PO TABS: 40.0000 mg | ORAL_TABLET | Freq: Every day | ORAL | 0 refills | Status: DC

## 2023-03-20 MED ORDER — LIDOCAINE 5 % EX PTCH: 1.0000 | MEDICATED_PATCH | CUTANEOUS | 0 refills | Status: DC

## 2023-03-20 NOTE — ED Triage Notes (Signed)
Patient c/o left sided low back pain that radiates down left side and leg x 2 days.  No apparent injury.  Patient had difficulty climbing into a truck on Wednesday all day.  Patient has taken Ibuprofen to help with pain.

## 2023-03-20 NOTE — Discharge Instructions (Addendum)
You were given a Toradol injection in clinic today. Do not take any over the counter NSAID's such as Advil, ibuprofen, Aleve, or naproxen for 24 hours. You may take tylenol if needed.  Lidoderm patch to your low back as needed.  Please leave this on for 12 hours and remove for 12 hours.  Start prednisone taper as prescribed.  Continue heat, rest and massage as needed.  Please follow-up with your PCP in 2 days for recheck.  Please go to the ER if you develop any worsening symptoms.  I hope you feel better soon!

## 2023-03-20 NOTE — ED Provider Notes (Addendum)
[facility]    CSN: [id number] Arrival date & time: 03/20/23  0808      History   Chief Complaint Chief Complaint  Patient presents with   Back Pain    HPI Jeffrey Rhodes is a 83 y.o. male presents for back pain.  Patient reports 3 days ago he was climbing into a truck that is higher than normal.  Since that time he has been having bilateral lower back pain that does radiate down into his left leg.  States it is intermittent and describes it as a soreness.  Also reports some soreness to his left knee.  Denies any injury such as fall.  No numbness/tingling/weakness of his lower extremities, no bowel or bladder incontinence, no saddle paresthesia.  Denies history of chronic low back pain/back surgeries.  He has been taking Tylenol arthritis, using heat and massage with some improvement in symptoms.  No other concerns at this time   Back Pain   Past Medical History:  Diagnosis Date   Allergy    Arthritis    Cataract    Coronary artery disease    GERD (gastroesophageal reflux disease)    Glaucoma    Heart murmur    High cholesterol    Hypertension    Hypothyroid    Myocardial infarction [facility])     Patient Active Problem List   Diagnosis Date Noted   Abdominal aortic aneurysm (AAA) without rupture (HCC) 06/09/2020   Coronary artery disease involving native coronary artery of native heart with angina pectoris (HCC) 12/12/2019   Chronic pain of both shoulders 05/31/2018   Benign prostatic hyperplasia 12/12/2017   Prediabetes 12/12/2017   Arthritis 10/10/2017   Chest pain 04/27/2015   Esophageal reflux 10/05/2012   Non-ST elevation MI (NSTEMI) (HCC) 09/18/2012   S/P CABG (coronary artery bypass graft) 09/18/2012   Dyslipidemia 09/18/2012   Sinus arrhythmia 09/18/2012   Essential hypertension, benign 09/18/2012   Hypothyroidism 09/18/2012   Insomnia 09/18/2012    Past Surgical History:  Procedure Laterality Date   CORONARY ARTERY BYPASS GRAFT  2014   [place]   DENTAL RESTORATION/EXTRACTION WITH X-RAY         Home Medications    Prior to Admission medications   Medication Sig Start Date End Date Taking? Authorizing Provider  acetaminophen (TYLENOL) 500 MG tablet Take 500 mg by mouth every 8 (eight) hours as needed for mild pain.    Yes [provider]  amLODipine (NORVASC) 10 MG tablet TAKE 1 TABLET BY MOUTH EVERY DAY 11/15/22  Yes Sharlene Dory, DO  aspirin EC 81 MG tablet Take 81 mg by mouth daily. Swallow whole.   Yes [provider]  clobetasol cream (TEMOVATE) 0.05 % Apply topically. 09/23/20  Yes [provider]  fluticasone (FLONASE) 50 MCG/ACT nasal spray PLACE 2 SPRAYS INTO BOTH NOSTRILS DAILY AS NEEDED FOR ALLERGIES OR RHINITIS. 03/07/23  Yes Wendling, Jilda Roche, DO  latanoprost (XALATAN) 0.005 % ophthalmic solution Place 1 drop into both eyes at bedtime.   Yes [provider]  levothyroxine (SYNTHROID) 137 MCG tablet TAKE 1 TABLET BY MOUTH EVERY DAY BEFORE BREAKFAST 01/03/23  Yes Wendling, Jilda Roche, DO  lidocaine (LIDODERM) 5 % Place 1 patch onto the skin daily. Leave on your low back for 12 hours and remove for 12 hours 03/20/23  Yes Radford Pax, NP  meloxicam (MOBIC) 15 MG tablet TAKE 1 TABLET (15 MG TOTAL) BY MOUTH DAILY. 02/04/23  Yes Sharlene Dory, DO  metoprolol succinate (TOPROL-XL) 25 MG 24 hr tablet Take 1 tablet (25 mg total) by mouth daily. 01/03/23  Yes Sharlene Dory, DO  montelukast (SINGULAIR) 10 MG tablet Take 1 tablet (10 mg total) by mouth at bedtime. 01/03/23  Yes Wendling, Jilda Roche, DO  OVER THE COUNTER MEDICATION Super Beta Prostate   Yes [provider]  predniSONE (DELTASONE) 20 MG tablet Take 2 tablets (40 mg total) by mouth daily with breakfast. Take 2 tablets daily by mouth for 3 days, then take 1 tablet daily by mouth for 3 days 03/20/23  Yes Radford Pax, NP  rosuvastatin (CRESTOR) 40 MG tablet TAKE 1 TABLET BY MOUTH  EVERY DAY 11/15/22  Yes Sharlene Dory, DO  tamsulosin (FLOMAX) 0.4 MG CAPS capsule TAKE 1 CAPSULE BY MOUTH EVERY DAY 03/14/23  Yes Sharlene Dory, DO  traMADol (ULTRAM) 50 MG tablet TAKE 1 TABLET NIGHTLY AS NEEDED FOR PAIN. 01/03/23  Yes Sharlene Dory, DO  zolpidem (AMBIEN) 10 MG tablet TAKE 1 TABLET BY MOUTH AT BEDTIME AS NEEDED FOR SLEEP 12/13/22  Yes Wendling, Jilda Roche, DO    Family History Family History  Problem Relation Age of Onset   Breast cancer Mother    Diabetes Brother    Heart disease Brother    Colon cancer Neg Hx    Esophageal cancer Neg Hx    Rectal cancer Neg Hx    Stomach cancer Neg Hx     Social History Social History   Tobacco Use   Smoking status: Former    Types: Cigarettes   Smokeless tobacco: Never   Tobacco comments:    2003 quit  Vaping Use   Vaping status: Never Used  Substance Use Topics   Alcohol use: Yes    Alcohol/week: 0.0 standard drinks of alcohol    Comment: occasional   Drug use: No     Allergies   Bee venom, Lisinopril, and Losartan   Review of Systems Review of Systems  Musculoskeletal:  Positive for back pain.     Physical Exam Triage Vital Signs ED Triage Vitals  Encounter Vitals Group     BP 03/20/23 0819 (!) 144/78     Systolic BP Percentile --      Diastolic BP Percentile --      Pulse Rate 03/20/23 0819 65     Resp 03/20/23 0819 18     Temp 03/20/23 0819 (!) 97.2 F (36.2 C)     Temp Source 03/20/23 0819 Oral     SpO2 03/20/23 0819 94 %     Weight 03/20/23 0820 220 lb (99.8 kg)     Height 03/20/23 0820 6' (1.829 m)     Head Circumference --      Peak Flow --      Pain Score 03/20/23 0820 8     Pain Loc --      Pain Education --      Exclude from Growth Chart --    No data found.  Updated Vital Signs BP (!) 144/78 (BP Location: Right Arm)   Pulse 65   Temp (!) 97.2 F (36.2 C) (Oral)   Resp 18   Ht 6' (1.829 m)   Wt 220 lb (99.8 kg)   SpO2 94%   BMI 29.84 kg/m    Visual Acuity Right Eye Distance:   Left Eye Distance:   Bilateral Distance:    Right Eye Near:   Left Eye Near:    Bilateral Near:  Physical Exam Vitals and nursing note reviewed.  Constitutional:      General: He is not in acute distress.    Appearance: Normal appearance. He is not ill-appearing.  HENT:     Head: Normocephalic and atraumatic.  Eyes:     Pupils: Pupils are equal, round, and reactive to light.  Cardiovascular:     Rate and Rhythm: Normal rate.  Pulmonary:     Effort: Pulmonary effort is normal.  Musculoskeletal:     Thoracic back: Normal.     Lumbar back: Tenderness present. No swelling, edema, deformity, signs of trauma, lacerations, spasms or bony tenderness. Normal range of motion. Positive left straight leg raise test. Negative right straight leg raise test. No scoliosis.       Back:     Left knee: No swelling, deformity, effusion, erythema, ecchymosis, lacerations or bony tenderness. Normal range of motion. Tenderness present over the medial joint line. No patellar tendon tenderness. Normal alignment and normal patellar mobility. Normal pulse.     Comments: Strength 5 out of 5 bilateral lower extremities  Skin:    General: Skin is warm and dry.  Neurological:     General: No focal deficit present.     Mental Status: He is alert and oriented to person, place, and time.  Psychiatric:        Mood and Affect: Mood normal.        Behavior: Behavior normal.      UC Treatments / Results  Labs (all labs ordered are listed, but only abnormal results are displayed) Comprehensive metabolic panel Order: [id number]  Status: Final result     Next appt: 04/06/2023 at 08:40 AM in Cardiology Olga Millers, MD)     Dx: Essential hypertension, benign   Test Result Released: Yes (seen)     Messages: Seen   1 Result Note     1 Patient Communication          Component Ref Range & Units (hover) 2 mo ago (01/03/23) 1 yr ago (01/25/22) 1 yr  ago (06/15/21) 2 yr ago (02/18/21) 2 yr ago (12/15/20) 3 yr ago (12/12/19) 4 yr ago (12/18/18)  Sodium 138 137 R 138  139 139 R 139  Potassium 4.6 4.7 R 4.5  4.0 4.5 R 4.8  Chloride 101 102 R 102  103 103 R 102  CO2 29 25 R 28  27 24  R 29  Glucose, Bld 105 High  91 113 High   84 108 High  R, CM 110 High   BUN 14 21 R 17  19 17  R 21  Creatinine, Ser 0.91 0.90 R 0.93  0.87 0.96 R, CM 0.99  Total Bilirubin 0.5 0.4 R 0.5 0.4 R 0.5 0.4 0.5  Alkaline Phosphatase 58 74 R 65 71 R 60  53  AST 18 16 R 16 13 R 15 14 R 13  ALT 14 15 R 15 13 R 12 11 R 15  Total Protein 7.8 7.3 R 7.7 7.5 R 7.2 7.4 R 7.2  Albumin 4.3 4.3 R 4.2 4.2 R 4.1  4.1  GFR 79.95  78.75 CM  83.04 CM  73.53  Comment: Calculated using the CKD-EPI Creatinine Equation (2021)  Calcium 9.6 9.4 R 9.1  9.3 9.4 R 9.5  Resulting Agency [place] [facility]on [facility]        Specimen Collected: 01/03/23 07:18 Last Resulted: 01/03/23 10:50  Labs Reviewed - No data to display KG  Radiology No results found.  Procedures Procedures (including critical care time)  Medications Ordered in UC Medications  ketorolac (TORADOL) 30 MG/ML injection 15 mg (15 mg Intramuscular Given 03/20/23 0848)    Initial Impression / Assessment and Plan / UC Course  I have reviewed the triage vital signs and the nursing notes.  Pertinent labs & imaging results that were available during my care of the patient were reviewed by me and considered in my medical decision making (see chart for details).     Reviewed exam and symptoms with patient.  No red flags.  Discussed low back strain/knee strain.  Patient given Toradol injection in clinic.  He was monitored for 10 minutes after injection with no reaction noted and tolerated well.  He was instructed no NSAIDs for 24 hours and he verbalized understanding.  Will do Lidoderm patch to low back.  Will do prednisone taper.  Patient requested Ace wrap  for knee, put on by nursing staff.  Advised to continue rest, heat and massage as needed.  PCP follow-up 2 days for recheck.  ER precautions reviewed. Final Clinical Impressions(s) / UC Diagnoses   Final diagnoses:  Acute bilateral low back pain with left-sided sciatica  Knee strain, left, initial encounter     Discharge Instructions      You were given a Toradol injection in clinic today. Do not take any over the counter NSAID's such as Advil, ibuprofen, Aleve, or naproxen for 24 hours. You may take tylenol if needed.  Lidoderm patch to your low back as needed.  Please leave this on for 12 hours and remove for 12 hours.  Start prednisone taper as prescribed.  Continue heat, rest and massage as needed.  Please follow-up with your PCP in 2 days for recheck.  Please go to the ER if you develop any worsening symptoms.  I hope you feel better soon!      ED Prescriptions     Medication Sig Dispense Auth. Provider   lidocaine (LIDODERM) 5 % Place 1 patch onto the skin daily. Leave on your low back for 12 hours and remove for 12 hours 10 patch Radford Pax, NP   predniSONE (DELTASONE) 20 MG tablet Take 2 tablets (40 mg total) by mouth daily with breakfast. Take 2 tablets daily by mouth for 3 days, then take 1 tablet daily by mouth for 3 days 9 tablet Radford Pax, NP      PDMP not reviewed this encounter.   Radford Pax, NP 03/20/23 1610    Radford Pax, NP 03/20/23 [phone]

## 2023-03-20 NOTE — ED Notes (Signed)
Patient states he starting to get some pain relief in his knee. Med follow up, no adverse reaction noted.

## 2023-03-25 NOTE — Progress Notes (Signed)
HPI: Follow-up coronary artery disease. Patient had coronary artery bypass graft in [place] in 2014. In March 2017 he was admitted with chest pain and ruled out. His symptoms were felt to be atypical. Nuclear study showed an ejection fraction of 58%. No infarct or ischemia. Abd ultrasound 9/19 showed no AAA. Echocardiogram November 2024 showed normal LV function, mild to moderate biatrial enlargement, mild mitral regurgitation, mild to moderate aortic stenosis with mean gradient 14 mmHg and aortic valve area 1.38 cm, mildly dilated aortic root at 40 mm.  Carotid Dopplers November 2024 showed 1 to 39% bilateral stenosis.  Since last seen he denies dyspnea on exertion, orthopnea, PND, pedal edema, chest pain or syncope.  Current Outpatient Medications  Medication Sig Dispense Refill   acetaminophen (TYLENOL) 500 MG tablet Take 500 mg by mouth every 8 (eight) hours as needed for mild pain.      amLODipine (NORVASC) 10 MG tablet TAKE 1 TABLET BY MOUTH EVERY DAY 90 tablet 2   aspirin EC 81 MG tablet Take 81 mg by mouth daily. Swallow whole.     clobetasol cream (TEMOVATE) 0.05 % Apply topically.     fluticasone (FLONASE) 50 MCG/ACT nasal spray PLACE 2 SPRAYS INTO BOTH NOSTRILS DAILY AS NEEDED FOR ALLERGIES OR RHINITIS. 48 mL 3   latanoprost (XALATAN) 0.005 % ophthalmic solution Place 1 drop into both eyes at bedtime.     meloxicam (MOBIC) 15 MG tablet TAKE 1 TABLET (15 MG TOTAL) BY MOUTH DAILY. 30 tablet 3   metoprolol succinate (TOPROL-XL) 25 MG 24 hr tablet Take 1 tablet (25 mg total) by mouth daily. 90 tablet 3   OVER THE COUNTER MEDICATION Super Beta Prostate     rosuvastatin (CRESTOR) 40 MG tablet TAKE 1 TABLET BY MOUTH EVERY DAY 90 tablet 3   sulfamethoxazole-trimethoprim (BACTRIM DS) 800-160 MG tablet Take 1 tablet by mouth 2 (two) times daily for 7 days. 14 tablet 0   tamsulosin (FLOMAX) 0.4 MG CAPS capsule TAKE 1 CAPSULE BY MOUTH EVERY DAY 90 capsule 3   zolpidem (AMBIEN) 10 MG tablet  TAKE 1 TABLET BY MOUTH AT BEDTIME AS NEEDED FOR SLEEP 30 tablet 5   levothyroxine (SYNTHROID) 137 MCG tablet TAKE 1 TABLET BY MOUTH EVERY DAY BEFORE BREAKFAST (Patient not taking: Reported on 04/06/2023) 90 tablet 1   lidocaine (LIDODERM) 5 % Place 1 patch onto the skin daily. Leave on your low back for 12 hours and remove for 12 hours 10 patch 0   montelukast (SINGULAIR) 10 MG tablet Take 1 tablet (10 mg total) by mouth at bedtime. 90 tablet 1   predniSONE (DELTASONE) 20 MG tablet Take 2 tablets (40 mg total) by mouth daily with breakfast. Take 2 tablets daily by mouth for 3 days, then take 1 tablet daily by mouth for 3 days 9 tablet 0   traMADol (ULTRAM) 50 MG tablet TAKE 1 TABLET NIGHTLY AS NEEDED FOR PAIN. (Patient not taking: Reported on 04/06/2023) 90 tablet 1   No current facility-administered medications for this visit.     Past Medical History:  Diagnosis Date   Allergy    Arthritis    Cataract    Coronary artery disease    GERD (gastroesophageal reflux disease)    Glaucoma    Heart murmur    High cholesterol    Hypertension    Hypothyroid    Myocardial infarction [facility])     Past Surgical History:  Procedure Laterality Date   CORONARY ARTERY BYPASS GRAFT  2014   [place]   DENTAL RESTORATION/EXTRACTION WITH X-RAY      Social History   Socioeconomic History   Marital status: Widowed    Spouse name: Not on file   Number of children: 2   Years of education: Not on file   Highest education level: Not on file  Occupational History   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  Tobacco Use   Smoking status: Former    Types: Cigarettes   Smokeless tobacco: Never   Tobacco comments:    2003 quit  Vaping Use   Vaping status: Never Used  Substance and Sexual Activity   Alcohol use: Yes    Alcohol/week: 0.0 standard drinks of alcohol    Comment: occasional   Drug use: No   Sexual activity: Not Currently  Other Topics Concern   Not on file  Social History Narrative   Not  on file   Social Drivers of Health   Financial Resource Strain: Low Risk  (12/28/2022)   Overall Financial Resource Strain (CARDIA)    Difficulty of Paying Living Expenses: Not hard at all  Food Insecurity: No Food Insecurity (12/28/2022)   Hunger Vital Sign    Worried About Running Out of Food in the Last Year: Never true    Ran Out of Food in the Last Year: Never true  Transportation Needs: No Transportation Needs (12/28/2022)   PRAPARE - Administrator, Civil Service (Medical): No    Lack of Transportation (Non-Medical): No  Physical Activity: Insufficiently Active (12/28/2022)   Exercise Vital Sign    Days of Exercise per Week: 7 days    Minutes of Exercise per Session: 20 min  Stress: No Stress Concern Present (12/28/2022)   [employer] of Occupational Health - Occupational Stress Questionnaire    Feeling of Stress : Not at all  Social Connections: Socially Isolated (12/28/2022)   Social Connection and Isolation Panel [NHANES]    Frequency of Communication with Friends and Family: More than three times a week    Frequency of Social Gatherings with Friends and Family: More than three times a week    Attends Religious Services: Never    Database administrator or Organizations: No    Attends Banker Meetings: Never    Marital Status: Widowed  Intimate Partner Violence: Not At Risk (12/28/2022)   Humiliation, Afraid, Rape, and Kick questionnaire    Fear of Current or Ex-Partner: No    Emotionally Abused: No    Physically Abused: No    Sexually Abused: No    Family History  Problem Relation Age of Onset   Breast cancer Mother    Diabetes Brother    Heart disease Brother    Colon cancer Neg Hx    Esophageal cancer Neg Hx    Rectal cancer Neg Hx    Stomach cancer Neg Hx     ROS: no fevers or chills, productive cough, hemoptysis, dysphasia, odynophagia, melena, hematochezia, dysuria, hematuria, rash, seizure activity, orthopnea, PND, pedal edema,  claudication. Remaining systems are negative.  Physical Exam: Well-developed well-nourished in no acute distress.  Skin is warm and dry.  HEENT is normal.  Neck is supple.  Chest is clear to auscultation with normal expansion.  Cardiovascular exam is regular rate and rhythm.  2/6 systolic murmur left sternal border. Abdominal exam nontender or distended. No masses palpated. Extremities show no edema. neuro grossly intact   A/P  1 coronary artery disease status post coronary artery bypass graft-patient  denies chest pain.  Continue aspirin and statin.  2 aortic stenosis-will plan follow-up echocardiogram November 2025.  Patient understands symptoms to be aware of including dyspnea, chest pain and syncope.  He understands that aortic stenosis is a progressive illness and he may require aortic valve replacement in the future.  3 hyperlipidemia-continue statin.  Most recent LDL 65.  Add Zetia 10 mg daily.  Check lipids and liver in 8 weeks.  4 hypertension-blood pressure controlled.  Continue present medications.  Olga Millers, MD

## 2023-04-03 ENCOUNTER — Encounter: Payer: Self-pay | Admitting: Emergency Medicine

## 2023-04-03 ENCOUNTER — Ambulatory Visit
Admission: EM | Admit: 2023-04-03 | Discharge: 2023-04-03 | Disposition: A | Discharge status: Home / Self Care | Payer: PPO | Attending: Family Medicine | Admitting: Family Medicine

## 2023-04-03 DIAGNOSIS — M545 Low back pain, unspecified: Secondary | ICD-10-CM | POA: Insufficient documentation

## 2023-04-03 DIAGNOSIS — R300 Dysuria: Secondary | ICD-10-CM | POA: Diagnosis not present

## 2023-04-03 LAB — POCT URINALYSIS DIP (MANUAL ENTRY)
Blood, UA: NEGATIVE
Glucose, UA: NEGATIVE mg/dL
Leukocytes, UA: NEGATIVE
Nitrite, UA: NEGATIVE
Protein Ur, POC: 30 mg/dL — AB
Spec Grav, UA: >=1.03 — AB (ref 1.010–1.025)
Urobilinogen, UA: 0.2 U/dL
pH, UA: 5.5 (ref 5.0–8.0)

## 2023-04-03 MED ORDER — KETOROLAC TROMETHAMINE 30 MG/ML IJ SOLN
15.0000 mg | Freq: Once | INTRAMUSCULAR | Status: AC
  Administered 2023-04-03: 15 mg via INTRAMUSCULAR

## 2023-04-03 MED ORDER — SULFAMETHOXAZOLE-TRIMETHOPRIM 800-160 MG PO TABS: 1.0000 | ORAL_TABLET | Freq: Two times a day (BID) | ORAL | 0 refills | Status: AC

## 2023-04-03 NOTE — Discharge Instructions (Signed)
 You were given a Toradol  injection in clinic today. Do not take any over the counter NSAID's such as Advil, ibuprofen, Aleve, or naproxen for 24 hours. You may take tylenol  if needed.  Start Bactrim  twice daily for 7 days.  The clinical contact you with results of the urine culture done today if positive.  Please increase your fluids and rest.  You may do heat to your low back as well.  Please follow-up with your PCP in 2 to 3 days for recheck.  Please go to the ER if you develop any worsening symptoms.  Hope you feel better soon!

## 2023-04-03 NOTE — ED Provider Notes (Signed)
 [facility]    CSN: [id number] Arrival date & time: 04/03/23  0801      History   Chief Complaint Chief Complaint  Patient presents with   Flank Pain   Urinary Frequency    HPI Jeffrey Rhodes is a 83 y.o. male presents for evaluation of dysuria.  Patient reports 1 week of urinary burning and urgency as well as frequency.  Denies hematuria, fevers, nausea/vomiting, flank pain.  No penile discharge or STD concern/exposure.  Denies difficulty starting or stopping the urine stream.  Denies history of BPH but problem list does show a history of this.  Reports he is had a UTI many many years ago and this feels similar.  In addition he continues to have bilateral lower back pain.  He was seen in urgent care on January 26 for same complaint and was given a Toradol  injection as well as a prednisone  taper with relief of symptoms.  States this same is similar to when he was here that time.  He denies any numbness/tingling/weakness of his lower extremities, no bowel or bladder incontinence, no saddle paresthesia.  He has not taken any OTC medications for symptoms since onset.  No other concerns at this time   Flank Pain  Urinary Frequency    Past Medical History:  Diagnosis Date   Allergy    Arthritis    Cataract    Coronary artery disease    GERD (gastroesophageal reflux disease)    Glaucoma    Heart murmur    High cholesterol    Hypertension    Hypothyroid    Myocardial infarction [facility])     Patient Active Problem List   Diagnosis Date Noted   Abdominal aortic aneurysm (AAA) without rupture (HCC) 06/09/2020   Coronary artery disease involving native coronary artery of native heart with angina pectoris (HCC) 12/12/2019   Chronic pain of both shoulders 05/31/2018   Benign prostatic hyperplasia 12/12/2017   Prediabetes 12/12/2017   Arthritis 10/10/2017   Chest pain 04/27/2015   Esophageal reflux 10/05/2012   Non-ST elevation MI (NSTEMI) (HCC) 09/18/2012   S/P CABG  (coronary artery bypass graft) 09/18/2012   Dyslipidemia 09/18/2012   Sinus arrhythmia 09/18/2012   Essential hypertension, benign 09/18/2012   Hypothyroidism 09/18/2012   Insomnia 09/18/2012    Past Surgical History:  Procedure Laterality Date   CORONARY ARTERY BYPASS GRAFT  2014   [place]   DENTAL RESTORATION/EXTRACTION WITH X-RAY         Home Medications    Prior to Admission medications   Medication Sig Start Date End Date Taking? Authorizing Provider  sulfamethoxazole -trimethoprim  (BACTRIM  DS) 800-160 MG tablet Take 1 tablet by mouth 2 (two) times daily for 7 days. 04/03/23 04/10/23 Yes Izreal Kock, Jodi R, NP  acetaminophen  (TYLENOL ) 500 MG tablet Take 500 mg by mouth every 8 (eight) hours as needed for mild pain.     [provider]  amLODipine  (NORVASC ) 10 MG tablet TAKE 1 TABLET BY MOUTH EVERY DAY 11/15/22   Frann, Mabel Mt, DO  aspirin  EC 81 MG tablet Take 81 mg by mouth daily. Swallow whole.    [provider]  clobetasol cream (TEMOVATE) 0.05 % Apply topically. 09/23/20   [provider]  fluticasone  (FLONASE ) 50 MCG/ACT nasal spray PLACE 2 SPRAYS INTO BOTH NOSTRILS DAILY AS NEEDED FOR ALLERGIES OR RHINITIS. 03/07/23   Frann Mabel Mt, DO  latanoprost  (XALATAN ) 0.005 % ophthalmic solution Place 1 drop into both eyes at bedtime.    [provider]  levothyroxine  (SYNTHROID ) 137 MCG tablet TAKE 1 TABLET BY MOUTH EVERY DAY BEFORE BREAKFAST 01/03/23   Wendling, Mabel Mt, DO  lidocaine  (LIDODERM ) 5 % Place 1 patch onto the skin daily. Leave on your low back for 12 hours and remove for 12 hours 03/20/23   Katera Rybka, Jodi R, NP  meloxicam  (MOBIC ) 15 MG tablet TAKE 1 TABLET (15 MG TOTAL) BY MOUTH DAILY. 02/04/23   Frann Mabel Mt, DO  metoprolol  succinate (TOPROL -XL) 25 MG 24 hr tablet Take 1 tablet (25 mg total) by mouth daily. 01/03/23   Frann Mabel Mt, DO  montelukast  (SINGULAIR ) 10 MG tablet Take 1 tablet (10 mg  total) by mouth at bedtime. 01/03/23   Frann Mabel Mt, DO  OVER THE COUNTER MEDICATION Super Beta Prostate    [provider]  predniSONE  (DELTASONE ) 20 MG tablet Take 2 tablets (40 mg total) by mouth daily with breakfast. Take 2 tablets daily by mouth for 3 days, then take 1 tablet daily by mouth for 3 days 03/20/23   Loreda Myla SAUNDERS, NP  rosuvastatin  (CRESTOR ) 40 MG tablet TAKE 1 TABLET BY MOUTH EVERY DAY 11/15/22   Frann Mabel Mt, DO  tamsulosin  (FLOMAX ) 0.4 MG CAPS capsule TAKE 1 CAPSULE BY MOUTH EVERY DAY 03/14/23   Frann Mabel Mt, DO  traMADol  (ULTRAM ) 50 MG tablet TAKE 1 TABLET NIGHTLY AS NEEDED FOR PAIN. 01/03/23   Frann Mabel Mt, DO  zolpidem  (AMBIEN ) 10 MG tablet TAKE 1 TABLET BY MOUTH AT BEDTIME AS NEEDED FOR SLEEP 12/13/22   Wendling, Mabel Mt, DO    Family History Family History  Problem Relation Age of Onset   Breast cancer Mother    Diabetes Brother    Heart disease Brother    Colon cancer Neg Hx    Esophageal cancer Neg Hx    Rectal cancer Neg Hx    Stomach cancer Neg Hx     Social History Social History   Tobacco Use   Smoking status: Former    Types: Cigarettes   Smokeless tobacco: Never   Tobacco comments:    2003 quit  Vaping Use   Vaping status: Never Used  Substance Use Topics   Alcohol use: Yes    Alcohol/week: 0.0 standard drinks of alcohol    Comment: occasional   Drug use: No     Allergies   Bee venom, Lisinopril, and Losartan   Review of Systems Review of Systems  Genitourinary:  Positive for dysuria and frequency.  Musculoskeletal:  Positive for back pain.     Physical Exam Triage Vital Signs ED Triage Vitals  Encounter Vitals Group     BP 04/03/23 0811 119/68     Systolic BP Percentile --      Diastolic BP Percentile --      Pulse Rate 04/03/23 0811 69     Resp 04/03/23 0811 19     Temp 04/03/23 0811 97.8 F (36.6 C)     Temp Source 04/03/23 0811 Oral     SpO2 04/03/23 0811 95 %      Weight --      Height --      Head Circumference --      Peak Flow --      Pain Score 04/03/23 0810 5     Pain Loc --      Pain Education --      Exclude from Growth Chart --    No data found.  Updated Vital Signs BP 119/68 (  BP Location: Right Arm)   Pulse 69   Temp 97.8 F (36.6 C) (Oral)   Resp 19   SpO2 95%   Visual Acuity Right Eye Distance:   Left Eye Distance:   Bilateral Distance:    Right Eye Near:   Left Eye Near:    Bilateral Near:     Physical Exam Vitals and nursing note reviewed.  Constitutional:      General: He is not in acute distress.    Appearance: Normal appearance. He is not ill-appearing.  HENT:     Head: Normocephalic and atraumatic.  Eyes:     Pupils: Pupils are equal, round, and reactive to light.  Cardiovascular:     Rate and Rhythm: Normal rate.  Pulmonary:     Effort: Pulmonary effort is normal.  Abdominal:     Tenderness: There is no right CVA tenderness or left CVA tenderness.  Musculoskeletal:     Lumbar back: Tenderness present. No swelling, edema, deformity, signs of trauma, lacerations, spasms or bony tenderness. Normal range of motion. Negative right straight leg raise test and negative left straight leg raise test. No scoliosis.       Back:     Comments: Strength 5 out of 5 bilateral lower extremities  Skin:    General: Skin is warm and dry.  Neurological:     General: No focal deficit present.     Mental Status: He is alert and oriented to person, place, and time.  Psychiatric:        Mood and Affect: Mood normal.        Behavior: Behavior normal.      UC Treatments / Results  Labs (all labs ordered are listed, but only abnormal results are displayed) Labs Reviewed  POCT URINALYSIS DIP (MANUAL ENTRY) - Abnormal; Notable for the following components:      Result Value   Color, UA orange (*)    Bilirubin, UA small (*)    Ketones, POC UA trace (5) (*)    Spec Grav, UA >=1.030 (*)    Protein Ur, POC =30 (*)     All other components within normal limits  URINE CULTURE   Comprehensive metabolic panel Order: [id number]  Status: Final result     Next appt: 04/06/2023 at 08:40 AM in Cardiology Bard Shallow, MD)     Dx: Essential hypertension, benign   Test Result Released: Yes (seen)     Messages: Seen   1 Result Note     1 Patient Communication          Component Ref Range & Units (hover) 3 mo ago (01/03/23) 1 yr ago (01/25/22) 1 yr ago (06/15/21) 2 yr ago (02/18/21) 2 yr ago (12/15/20) 3 yr ago (12/12/19) 4 yr ago (12/18/18)  Sodium 138 137 R 138  139 139 R 139  Potassium 4.6 4.7 R 4.5  4.0 4.5 R 4.8  Chloride 101 102 R 102  103 103 R 102  CO2 29 25 R 28  27 24  R 29  Glucose, Bld 105 High  91 113 High   84 108 High  R, CM 110 High   BUN 14 21 R 17  19 17  R 21  Creatinine, Ser 0.91 0.90 R 0.93  0.87 0.96 R, CM 0.99  Total Bilirubin 0.5 0.4 R 0.5 0.4 R 0.5 0.4 0.5  Alkaline Phosphatase 58 74 R 65 71 R 60  53  AST 18 16 R 16 13 R 15 14 R 13  ALT 14 15 R 15 13 R 12 11 R 15  Total Protein 7.8 7.3 R 7.7 7.5 R 7.2 7.4 R 7.2  Albumin  4.3 4.3 R 4.2 4.2 R 4.1  4.1  GFR 79.95  78.75 CM  83.04 CM  73.53  Comment: Calculated using the CKD-EPI Creatinine Equation (2021)  Calcium  9.6 9.4 R 9.1  9.3 9.4 R 9.5  Resulting Agency [place] [facility] [place] [facility]        Specimen Collected: 01/03/23 07:18 Last Resulted: 01/03/23 10:50      EKG   Radiology No results found.  Procedures Procedures (including critical care time)  Medications Ordered in UC Medications  ketorolac  (TORADOL ) 30 MG/ML injection 15 mg (15 mg Intramuscular Given 04/03/23 0839)    Initial Impression / Assessment and Plan / UC Course  I have reviewed the triage vital signs and the nursing notes.  Pertinent labs & imaging results that were available during my care of the patient were reviewed by me and considered in my medical decision making (see chart for  details).     Reviewed exam and symptoms with patient.  No red flags.  Urine without blood nitrates or leukocytes.  Will send culture but given symptoms, age and history of BPH will start Bactrim  while awaiting results.  No CVAT on exam but patient does have some lower back tenderness consistent with his previous visit from January.  He was given Toradol  in clinic.  He was monitored for 10 minutes after injection with no reaction noted and tolerated well.  He was instructed no NSAIDs for 24 hours and he verbalized understanding.  Advised heat to the area.  He should follow-up with his PCP in 2 to 3 days for recheck.  ER precautions reviewed and patient verbalized understanding Final Clinical Impressions(s) / UC Diagnoses   Final diagnoses:  Acute bilateral low back pain without sciatica  Dysuria     Discharge Instructions      You were given a Toradol  injection in clinic today. Do not take any over the counter NSAID's such as Advil, ibuprofen, Aleve, or naproxen for 24 hours. You may take tylenol  if needed.  Start Bactrim  twice daily for 7 days.  The clinical contact you with results of the urine culture done today if positive.  Please increase your fluids and rest.  You may do heat to your low back as well.  Please follow-up with your PCP in 2 to 3 days for recheck.  Please go to the ER if you develop any worsening symptoms.  Hope you feel better soon!      ED Prescriptions     Medication Sig Dispense Auth. Provider   sulfamethoxazole -trimethoprim  (BACTRIM  DS) 800-160 MG tablet Take 1 tablet by mouth 2 (two) times daily for 7 days. 14 tablet Kaimana Neuzil, Jodi R, NP      PDMP not reviewed this encounter.   Loreda Myla SAUNDERS, NP 04/03/23 [phone]

## 2023-04-03 NOTE — ED Triage Notes (Signed)
 Pt c/o flank pain and urinary frequency for over a week. Reports that pain is worse at times. Hasn't taken anything for symptoms.

## 2023-04-05 LAB — URINE CULTURE: Culture: 30000 — AB

## 2023-04-06 ENCOUNTER — Encounter: Payer: Self-pay | Admitting: Cardiology

## 2023-04-06 ENCOUNTER — Ambulatory Visit: Payer: PPO | Attending: Cardiology | Admitting: Cardiology

## 2023-04-06 VITALS — BP 110/50 | HR 70 | Ht 72.0 in | Wt 218.0 lb

## 2023-04-06 DIAGNOSIS — I350 Nonrheumatic aortic (valve) stenosis: Secondary | ICD-10-CM | POA: Diagnosis not present

## 2023-04-06 DIAGNOSIS — E785 Hyperlipidemia, unspecified: Secondary | ICD-10-CM

## 2023-04-06 DIAGNOSIS — Z951 Presence of aortocoronary bypass graft: Secondary | ICD-10-CM | POA: Diagnosis not present

## 2023-04-06 DIAGNOSIS — I10 Essential (primary) hypertension: Secondary | ICD-10-CM

## 2023-04-06 MED ORDER — EZETIMIBE 10 MG PO TABS: 10.0000 mg | ORAL_TABLET | Freq: Every day | ORAL | 3 refills | Status: DC

## 2023-04-06 NOTE — Patient Instructions (Signed)
Medication Instructions:   START EZETIMIBE 10 MG ONCE DAILY  *If you need a refill on your cardiac medications before your next appointment, please call your pharmacy*   Lab Work:  Your physician recommends that you return for lab work in:[address] [facility] [place] on the 3 rd floor in [address] Hours-Monday - Friday 8 am-11:30 AM and 1 pm -4 pm  If you have labs (blood work) drawn today and your tests are completely normal, you will receive your results only by: MyChart Message (if you have MyChart) OR A paper copy in the mail If you have any lab test that is abnormal or we need to change your treatment, we will call you to review the results.   Follow-Up: At [facility], you and your health needs are our priority.  As part of our continuing mission to provide you with exceptional heart care, we have created designated Provider Care Teams.  These Care Teams include your primary Cardiologist (physician) and Advanced Practice Providers (APPs -  Physician Assistants and Nurse Practitioners) who all work together to provide you with the care you need, when you need it.  Your next appointment:   6 month(s)  Provider:   Olga Millers, MD

## 2023-05-07 DIAGNOSIS — H40029 Open angle with borderline findings, high risk, unspecified eye: Secondary | ICD-10-CM | POA: Diagnosis not present

## 2023-05-11 ENCOUNTER — Other Ambulatory Visit: Payer: Self-pay | Admitting: Family Medicine

## 2023-05-11 DIAGNOSIS — G4700 Insomnia, unspecified: Secondary | ICD-10-CM

## 2023-05-12 ENCOUNTER — Other Ambulatory Visit: Payer: Self-pay | Admitting: Family Medicine

## 2023-05-12 DIAGNOSIS — G4700 Insomnia, unspecified: Secondary | ICD-10-CM

## 2023-05-12 MED ORDER — ZOLPIDEM TARTRATE 10 MG PO TABS: 5.0000 mg | ORAL_TABLET | Freq: Every evening | ORAL | 5 refills | Status: DC | PRN

## 2023-05-12 NOTE — Telephone Encounter (Signed)
Printed by mistake  

## 2023-05-25 DIAGNOSIS — E785 Hyperlipidemia, unspecified: Secondary | ICD-10-CM | POA: Diagnosis not present

## 2023-05-26 LAB — HEPATIC FUNCTION PANEL
ALT: 18 IU/L (ref 0–44)
AST: 18 IU/L (ref 0–40)
Albumin: 4.4 g/dL (ref 3.8–4.8)
Alkaline Phosphatase: 62 IU/L (ref 44–121)
Bilirubin Total: 0.4 mg/dL (ref 0.0–1.2)
Bilirubin, Direct: 0.19 mg/dL (ref 0.00–0.40)
Total Protein: 7.4 g/dL (ref 6.0–8.5)

## 2023-05-26 LAB — LIPID PANEL
Chol/HDL Ratio: 2.3 ratio (ref 0.0–5.0)
Cholesterol, Total: 121 mg/dL (ref 100–199)
HDL: 52 mg/dL (ref >39)
LDL Chol Calc (NIH): 55 mg/dL (ref 0–99)
Triglycerides: 65 mg/dL (ref 0–149)
VLDL Cholesterol Cal: 14 mg/dL (ref 5–40)

## 2023-05-27 ENCOUNTER — Other Ambulatory Visit: Payer: Self-pay | Admitting: Family Medicine

## 2023-06-11 ENCOUNTER — Ambulatory Visit
Admission: EM | Admit: 2023-06-11 | Discharge: 2023-06-11 | Disposition: A | Discharge status: Home / Self Care | Attending: Family Medicine | Admitting: Family Medicine

## 2023-06-11 DIAGNOSIS — M5442 Lumbago with sciatica, left side: Secondary | ICD-10-CM | POA: Diagnosis not present

## 2023-06-11 MED ORDER — KETOROLAC TROMETHAMINE 30 MG/ML IJ SOLN
15.0000 mg | Freq: Once | INTRAMUSCULAR | Status: AC
  Administered 2023-06-11: 15 mg via INTRAMUSCULAR

## 2023-06-11 NOTE — ED Provider Notes (Signed)
 [facility]    CSN: [id number] Arrival date & time: 06/11/23  0802      History   Chief Complaint Chief Complaint  Patient presents with   Back Pain   Leg Pain    HPI Jeffrey Rhodes is a 83 y.o. male presents for acute on chronic back pain.  Patient has a history of chronic low back pain.  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  He reports of bilateral lower back pain that radiates down his left leg.  No numbness/tingling/weakness of his lower extremities, no bowel or bladder incontinence, no saddle paresthesia.  No history of low back surgeries.  He has not taken any OTC medications for the symptoms.  He was last seen in urgent care on February where he was given ketorolac  injection as well as treated for UTI with relief of symptoms.  He denies any dysuria.  No other concerns at this time.   Back Pain Associated symptoms: leg pain   Leg Pain Associated symptoms: back pain     Past Medical History:  Diagnosis Date   Allergy    Arthritis    Cataract    Coronary artery disease    GERD (gastroesophageal reflux disease)    Glaucoma    Heart murmur    High cholesterol    Hypertension    Hypothyroid    Myocardial infarction [facility])     Patient Active Problem List   Diagnosis Date Noted   Abdominal aortic aneurysm (AAA) without rupture (HCC) 06/09/2020   Coronary artery disease involving native coronary artery of native heart with angina pectoris (HCC) 12/12/2019   Chronic pain of both shoulders 05/31/2018   Benign prostatic hyperplasia 12/12/2017   Prediabetes 12/12/2017   Arthritis 10/10/2017   Chest pain 04/27/2015   Esophageal reflux 10/05/2012   Non-ST elevation MI (NSTEMI) (HCC) 09/18/2012   S/P CABG (coronary artery bypass graft) 09/18/2012   Dyslipidemia 09/18/2012   Sinus arrhythmia 09/18/2012   Essential hypertension, benign 09/18/2012   Hypothyroidism 09/18/2012    Insomnia 09/18/2012    Past Surgical History:  Procedure Laterality Date   CORONARY ARTERY BYPASS GRAFT  2014   [place]   DENTAL RESTORATION/EXTRACTION WITH X-RAY         Home Medications    Prior to Admission medications   Medication Sig Start Date End Date Taking? Authorizing Provider  acetaminophen  (TYLENOL ) 500 MG tablet Take 500 mg by mouth every 8 (eight) hours as needed for mild pain.     [provider]  amLODipine  (NORVASC ) 10 MG tablet TAKE 1 TABLET BY MOUTH EVERY DAY 11/15/22   Gwenette Lennox, Shellie Dials, DO  aspirin  EC 81 MG tablet Take 81 mg by mouth daily. Swallow whole.    [provider]  clobetasol cream (TEMOVATE) 0.05 % Apply topically. 09/23/20   [provider]  ezetimibe  (ZETIA ) 10 MG tablet Take 1 tablet (10 mg total) by mouth daily. 04/06/23   Lenise Quince, MD  fluticasone  (FLONASE ) 50 MCG/ACT nasal spray PLACE 2 SPRAYS INTO BOTH NOSTRILS DAILY AS NEEDED FOR ALLERGIES OR RHINITIS. 03/07/23   Wendling, Shellie Dials, DO  latanoprost (XALATAN) 0.005 % ophthalmic solution Place 1 drop into both eyes at bedtime.    [provider]  levothyroxine  (SYNTHROID ) 137 MCG tablet TAKE 1 TABLET BY MOUTH EVERY DAY BEFORE BREAKFAST Patient not taking: Reported on 04/06/2023 01/03/23   Jobe Mulder, DO  meloxicam  (MOBIC ) 15 MG tablet TAKE 1 TABLET (15 MG TOTAL) BY MOUTH DAILY. 05/27/23   Jobe Mulder, DO  metoprolol  succinate (TOPROL -XL) 25 MG 24 hr tablet Take 1 tablet (25 mg total) by mouth daily. 01/03/23   Jobe Mulder, DO  OVER THE COUNTER MEDICATION Super Beta Prostate    [provider]  rosuvastatin  (CRESTOR ) 40 MG tablet TAKE 1 TABLET BY MOUTH EVERY DAY 11/15/22   Jobe Mulder, DO  tamsulosin  (FLOMAX ) 0.4 MG CAPS capsule TAKE 1 CAPSULE BY MOUTH EVERY DAY 03/14/23   Wendling, Shellie Dials, DO  traMADol  (ULTRAM ) 50 MG tablet TAKE 1 TABLET NIGHTLY AS NEEDED FOR PAIN. Patient not taking:  Reported on 04/06/2023 01/03/23   Jobe Mulder, DO  zolpidem  (AMBIEN ) 10 MG tablet Take 0.5-1 tablets (5-10 mg total) by mouth at bedtime as needed for sleep. 05/12/23   Jobe Mulder, DO    Family History Family History  Problem Relation Age of Onset   Breast cancer Mother    Diabetes Brother    Heart disease Brother    Colon cancer Neg Hx    Esophageal cancer Neg Hx    Rectal cancer Neg Hx    Stomach cancer Neg Hx     Social History Social History   Tobacco Use   Smoking status: Former    Types: Cigarettes   Smokeless tobacco: Never   Tobacco comments:    2003 quit  Vaping Use   Vaping status: Never Used  Substance Use Topics   Alcohol use: Yes    Alcohol/week: 0.0 standard drinks of alcohol    Comment: occasional   Drug use: No     Allergies   Bee venom, Lisinopril, and Losartan   Review of Systems Review of Systems  Musculoskeletal:  Positive for back pain.     Physical Exam Triage Vital Signs ED Triage Vitals  Encounter Vitals Group     BP 06/11/23 0819 137/71     Systolic BP Percentile --      Diastolic BP Percentile --      Pulse Rate 06/11/23 0819 65     Resp 06/11/23 0819 20     Temp 06/11/23 0819 98 F (36.7 C)     Temp Source 06/11/23 0819 Oral     SpO2 06/11/23 0819 95 %     Weight --      Height --      Head Circumference --      Peak Flow --      Pain Score 06/11/23 0818 8     Pain Loc --      Pain Education --      Exclude from Growth Chart --    No data found.  Updated Vital Signs BP 137/71 (BP Location: Left Arm)   Pulse 65   Temp 98 F (36.7 C) (Oral)   Resp 20   SpO2 95%   Visual Acuity Right Eye Distance:   Left Eye Distance:   Bilateral Distance:    Right Eye Near:   Left Eye Near:    Bilateral Near:     Physical Exam Vitals and nursing note reviewed.  Constitutional:      General: He is not in acute distress.    Appearance: Normal appearance. He is not ill-appearing.  HENT:     Head:  Normocephalic and atraumatic.  Eyes:     Pupils: Pupils are equal, round, and reactive to light.  Cardiovascular:  Rate and Rhythm: Normal rate.  Pulmonary:     Effort: Pulmonary effort is normal.  Musculoskeletal:     Thoracic back: Normal.     Lumbar back: Tenderness present. No swelling, edema, deformity, signs of trauma, lacerations, spasms or bony tenderness. Normal range of motion. Positive left straight leg raise test. Negative right straight leg raise test. No scoliosis.       Back:     Comments: Strength is 5 out of 5 bilateral lower extremities  Skin:    General: Skin is warm and dry.  Neurological:     General: No focal deficit present.     Mental Status: He is alert and oriented to person, place, and time.  Psychiatric:        Mood and Affect: Mood normal.        Behavior: Behavior normal.      UC Treatments / Results  Labs (all labs ordered are listed, but only abnormal results are displayed) Labs Reviewed - No data to display   Comprehensive metabolic panel Order: [id number]  Status: Final result     Next appt: 07/11/2023 at 07:00 AM in Family Medicine (Jobe Mulder, [place])     Dx: Essential hypertension, benign   Test Result Released: Yes (seen)     Messages: Seen   1 Result Note     1 Patient Communication  important suggestion  Newer results are available. Click to view them now.            Component Ref Range & Units (hover) 5 mo ago (01/03/23) 1 yr ago (01/25/22) 1 yr ago (06/15/21) 2 yr ago (02/18/21) 2 yr ago (12/15/20) 3 yr ago (12/12/19) 4 yr ago (12/18/18)  Sodium 138 137 R 138  139 139 R 139  Potassium 4.6 4.7 R 4.5  4.0 4.5 R 4.8  Chloride 101 102 R 102  103 103 R 102  CO2 29 25 R 28  27 24  R 29  Glucose, Bld 105 High  91 113 High   84 108 High  R, CM 110 High   BUN 14 21 R 17  19 17  R 21  Creatinine, Ser 0.91 0.90 R 0.93  0.87 0.96 R, CM 0.99  Total Bilirubin 0.5 0.4 R 0.5 0.4 R 0.5 0.4 0.5  Alkaline Phosphatase 58 74 R  65 71 R 60  53  AST 18 16 R 16 13 R 15 14 R 13  ALT 14 15 R 15 13 R 12 11 R 15  Total Protein 7.8 7.3 R 7.7 7.5 R 7.2 7.4 R 7.2  Albumin 4.3 4.3 R 4.2 4.2 R 4.1  4.1  GFR 79.95  78.75 CM  83.04 CM  73.53  Comment: Calculated using the CKD-EPI Creatinine Equation (2021)  Calcium  9.6 9.4 R 9.1  9.3 9.4 R 9.5  Resulting Agency [place] [facility] [place] [facility] [place] [facility]        Specimen Collected: 01/03/23 07:18 Last Resulted: 01/03/23 10:50     Result History    View All Conversations on this Encounter      EKG   Radiology No results found.  Procedures Procedures (including critical care time)  Medications Ordered in UC Medications  ketorolac  (TORADOL ) 30 MG/ML injection 15 mg (has no administration in time range)    Initial Impression / Assessment and Plan / UC Course  I have reviewed the triage vital signs and the nursing notes.  Pertinent labs & imaging results that  were available during my care of the patient were reviewed by me and considered in my medical decision making (see chart for details).     Reviewed exam and symptoms with patient.  No red flags.  Discussed acute on chronic low back pain.  Patient given ketorolac  in clinic.  He was monitored for 10 minutes after injection with no reaction noted and tolerated well.  He was instructed no NSAIDs for 24 hours and verbalized understanding.  He states has had relief with Lidoderm  patches.  States his insurance does not cover these but he can get them over-the-counter and utilize them as well.  Discussed heat and rest.  PCP follow-up if symptoms do not improve.  ER precautions reviewed and patient verbalized understanding. Final Clinical Impressions(s) / UC Diagnoses   Final diagnoses:  Acute bilateral low back pain with left-sided sciatica   Discharge Instructions   None    ED Prescriptions   None    PDMP not reviewed this encounter.   Alleen Arbour, NP 06/11/23  [phone]

## 2023-06-11 NOTE — Discharge Instructions (Addendum)
 You were given a Toradol  injection in clinic today. Do not take any over the counter NSAID's such as Advil, ibuprofen, Aleve, or naproxen for 24 hours. You may take tylenol  if needed.  Use over-the-counter Lidoderm  patches to the low back as needed.  Also use heat and rest.  Please follow-up with your PCP if your symptoms do not improve.  Please go to the ER if you develop any worsening symptoms.  Hope you feel better soon!

## 2023-06-11 NOTE — ED Triage Notes (Signed)
 Pt c/o back pain and left leg pain x 1day. No home intervention.

## 2023-07-11 ENCOUNTER — Encounter: Payer: Self-pay | Admitting: Family Medicine

## 2023-07-11 ENCOUNTER — Ambulatory Visit (INDEPENDENT_AMBULATORY_CARE_PROVIDER_SITE_OTHER): Payer: PPO | Admitting: Family Medicine

## 2023-07-11 VITALS — BP 124/72 | HR 97 | Temp 98.0°F | Resp 16 | Ht 73.0 in | Wt 216.8 lb

## 2023-07-11 DIAGNOSIS — E785 Hyperlipidemia, unspecified: Secondary | ICD-10-CM | POA: Diagnosis not present

## 2023-07-11 DIAGNOSIS — Z79899 Other long term (current) drug therapy: Secondary | ICD-10-CM | POA: Diagnosis not present

## 2023-07-11 DIAGNOSIS — I10 Essential (primary) hypertension: Secondary | ICD-10-CM | POA: Diagnosis not present

## 2023-07-11 DIAGNOSIS — G8929 Other chronic pain: Secondary | ICD-10-CM | POA: Diagnosis not present

## 2023-07-11 DIAGNOSIS — M25512 Pain in left shoulder: Secondary | ICD-10-CM

## 2023-07-11 DIAGNOSIS — G4700 Insomnia, unspecified: Secondary | ICD-10-CM | POA: Diagnosis not present

## 2023-07-11 DIAGNOSIS — E039 Hypothyroidism, unspecified: Secondary | ICD-10-CM | POA: Diagnosis not present

## 2023-07-11 DIAGNOSIS — M25511 Pain in right shoulder: Secondary | ICD-10-CM | POA: Diagnosis not present

## 2023-07-11 MED ORDER — TRAMADOL HCL 50 MG PO TABS: ORAL_TABLET | ORAL | 1 refills | Status: DC

## 2023-07-11 NOTE — Patient Instructions (Addendum)
Give us 2-3 business days to get the results of your labs back.   Keep the diet clean and stay active.  Let us know if you need anything.  EXERCISES  RANGE OF MOTION (ROM) AND STRETCHING EXERCISES - Low Back Pain Most people with lower back pain will find that their symptoms get worse with excessive bending forward (flexion) or arching at the lower back (extension). The exercises that will help resolve your symptoms will focus on the opposite motion.  If you have pain, numbness or tingling which travels down into your buttocks, leg or foot, the goal of the therapy is for these symptoms to move closer to your back and eventually resolve. Sometimes, these leg symptoms will get better, but your lower back pain may worsen. This is often an indication of progress in your rehabilitation. Be very alert to any changes in your symptoms and the activities in which you participated in the 24 hours prior to the change. Sharing this information with your caregiver will allow him or her to most efficiently treat your condition. These exercises may help you when beginning to rehabilitate your injury. Your symptoms may resolve with or without further involvement from your physician, physical therapist or athletic trainer. While completing these exercises, remember:   Restoring tissue flexibility helps normal motion to return to the joints. This allows healthier, less painful movement and activity.  An effective stretch should be held for at least 30 seconds.  A stretch should never be painful. You should only feel a gentle lengthening or release in the stretched tissue. FLEXION RANGE OF MOTION AND STRETCHING EXERCISES:  STRETCH - Flexion, Single Knee to Chest   Lie on a firm bed or floor with both legs extended in front of you.  Keeping one leg in contact with the floor, bring your opposite knee to your chest. Hold your leg in place by either grabbing behind your thigh or at your knee.  Pull until you feel  a gentle stretch in your low back. Hold 30 seconds.  Slowly release your grasp and repeat the exercise with the opposite side. Repeat 2 times. Complete this exercise 3 times per week.   STRETCH - Flexion, Double Knee to Chest  Lie on a firm bed or floor with both legs extended in front of you.  Keeping one leg in contact with the floor, bring your opposite knee to your chest.  Tense your stomach muscles to support your back and then lift your other knee to your chest. Hold your legs in place by either grabbing behind your thighs or at your knees.  Pull both knees toward your chest until you feel a gentle stretch in your low back. Hold 30 seconds.  Tense your stomach muscles and slowly return one leg at a time to the floor. Repeat 2 times. Complete this exercise 3 times per week.   STRETCH - Low Trunk Rotation  Lie on a firm bed or floor. Keeping your legs in front of you, bend your knees so they are both pointed toward the ceiling and your feet are flat on the floor.  Extend your arms out to the side. This will stabilize your upper body by keeping your shoulders in contact with the floor.  Gently and slowly drop both knees together to one side until you feel a gentle stretch in your low back. Hold for 30 seconds.  Tense your stomach muscles to support your lower back as you bring your knees back to the starting position.   Repeat the exercise to the other side. Repeat 2 times. Complete this exercise at least 3 times per week.   EXTENSION RANGE OF MOTION AND FLEXIBILITY EXERCISES:  STRETCH - Extension, Prone on Elbows   Lie on your stomach on the floor, a bed will be too soft. Place your palms about shoulder width apart and at the height of your head.  Place your elbows under your shoulders. If this is too painful, stack pillows under your chest.  Allow your body to relax so that your hips drop lower and make contact more completely with the floor.  Hold this position for 30  seconds.  Slowly return to lying flat on the floor. Repeat 2 times. Complete this exercise 3 times per week.   RANGE OF MOTION - Extension, Prone Press Ups  Lie on your stomach on the floor, a bed will be too soft. Place your palms about shoulder width apart and at the height of your head.  Keeping your back as relaxed as possible, slowly straighten your elbows while keeping your hips on the floor. You may adjust the placement of your hands to maximize your comfort. As you gain motion, your hands will come more underneath your shoulders.  Hold this position 30 seconds.  Slowly return to lying flat on the floor. Repeat 2 times. Complete this exercise 3 times per week.   RANGE OF MOTION- Quadruped, Neutral Spine   Assume a hands and knees position on a firm surface. Keep your hands under your shoulders and your knees under your hips. You may place padding under your knees for comfort.  Drop your head and point your tailbone toward the ground below you. This will round out your lower back like an angry cat. Hold this position for 30 seconds.  Slowly lift your head and release your tail bone so that your back sags into a large arch, like an old horse.  Hold this position for 30 seconds.  Repeat this until you feel limber in your low back.  Now, find your "sweet spot." This will be the most comfortable position somewhere between the two previous positions. This is your neutral spine. Once you have found this position, tense your stomach muscles to support your low back.  Hold this position for 30 seconds. Repeat 2 times. Complete this exercise 3 times per week.   STRENGTHENING EXERCISES - Low Back Sprain These exercises may help you when beginning to rehabilitate your injury. These exercises should be done near your "sweet spot." This is the neutral, low-back arch, somewhere between fully rounded and fully arched, that is your least painful position. When performed in this safe range of  motion, these exercises can be used for people who have either a flexion or extension based injury. These exercises may resolve your symptoms with or without further involvement from your physician, physical therapist or athletic trainer. While completing these exercises, remember:   Muscles can gain both the endurance and the strength needed for everyday activities through controlled exercises.  Complete these exercises as instructed by your physician, physical therapist or athletic trainer. Increase the resistance and repetitions only as guided.  You may experience muscle soreness or fatigue, but the pain or discomfort you are trying to eliminate should never worsen during these exercises. If this pain does worsen, stop and make certain you are following the directions exactly. If the pain is still present after adjustments, discontinue the exercise until you can discuss the trouble with your caregiver.  STRENGTHENING -   Deep Abdominals, Pelvic Tilt   Lie on a firm bed or floor. Keeping your legs in front of you, bend your knees so they are both pointed toward the ceiling and your feet are flat on the floor.  Tense your lower abdominal muscles to press your low back into the floor. This motion will rotate your pelvis so that your tail bone is scooping upwards rather than pointing at your feet or into the floor. With a gentle tension and even breathing, hold this position for 3 seconds. Repeat 2 times. Complete this exercise 3 times per week.   STRENGTHENING - Abdominals, Crunches   Lie on a firm bed or floor. Keeping your legs in front of you, bend your knees so they are both pointed toward the ceiling and your feet are flat on the floor. Cross your arms over your chest.  Slightly tip your chin down without bending your neck.  Tense your abdominals and slowly lift your trunk high enough to just clear your shoulder blades. Lifting higher can put excessive stress on the lower back and does not  further strengthen your abdominal muscles.  Control your return to the starting position. Repeat 2 times. Complete this exercise 3 times per week.   STRENGTHENING - Quadruped, Opposite UE/LE Lift   Assume a hands and knees position on a firm surface. Keep your hands under your shoulders and your knees under your hips. You may place padding under your knees for comfort.  Find your neutral spine and gently tense your abdominal muscles so that you can maintain this position. Your shoulders and hips should form a rectangle that is parallel with the floor and is not twisted.  Keeping your trunk steady, lift your right hand no higher than your shoulder and then your left leg no higher than your hip. Make sure you are not holding your breath. Hold this position for 30 seconds.  Continuing to keep your abdominal muscles tense and your back steady, slowly return to your starting position. Repeat with the opposite arm and leg. Repeat 2 times. Complete this exercise 3 times per week.   STRENGTHENING - Abdominals and Quadriceps, Straight Leg Raise   Lie on a firm bed or floor with both legs extended in front of you.  Keeping one leg in contact with the floor, bend the other knee so that your foot can rest flat on the floor.  Find your neutral spine, and tense your abdominal muscles to maintain your spinal position throughout the exercise.  Slowly lift your straight leg off the floor about 6 inches for a count of 3, making sure to not hold your breath.  Still keeping your neutral spine, slowly lower your leg all the way to the floor. Repeat this exercise with each leg 2 times. Complete this exercise 3 times per week.  POSTURE AND BODY MECHANICS CONSIDERATIONS - Low Back Sprain Keeping correct posture when sitting, standing or completing your activities will reduce the stress put on different body tissues, allowing injured tissues a chance to heal and limiting painful experiences. The following are  general guidelines for improved posture.  While reading these guidelines, remember:  The exercises prescribed by your provider will help you have the flexibility and strength to maintain correct postures.  The correct posture provides the best environment for your joints to work. All of your joints have less wear and tear when properly supported by a spine with good posture. This means you will experience a healthier, less painful body.    Correct posture must be practiced with all of your activities, especially prolonged sitting and standing. Correct posture is as important when doing repetitive low-stress activities (typing) as it is when doing a single heavy-load activity (lifting).  RESTING POSITIONS Consider which positions are most painful for you when choosing a resting position. If you have pain with flexion-based activities (sitting, bending, stooping, squatting), choose a position that allows you to rest in a less flexed posture. You would want to avoid curling into a fetal position on your side. If your pain worsens with extension-based activities (prolonged standing, working overhead), avoid resting in an extended position such as sleeping on your stomach. Most people will find more comfort when they rest with their spine in a more neutral position, neither too rounded nor too arched. Lying on a non-sagging bed on your side with a pillow between your knees, or on your back with a pillow under your knees will often provide some relief. Keep in mind, being in any one position for a prolonged period of time, no matter how correct your posture, can still lead to stiffness.  PROPER SITTING POSTURE In order to minimize stress and discomfort on your spine, you must sit with correct posture. Sitting with good posture should be effortless for a healthy body. Returning to good posture is a gradual process. Many people can work toward this most comfortably by using various supports until they have the  flexibility and strength to maintain this posture on their own. When sitting with proper posture, your ears will fall over your shoulders and your shoulders will fall over your hips. You should use the back of the chair to support your upper back. Your lower back will be in a neutral position, just slightly arched. You may place a small pillow or folded towel at the base of your lower back for  support.  When working at a desk, create an environment that supports good, upright posture. Without extra support, muscles tire, which leads to excessive strain on joints and other tissues. Keep these recommendations in mind:  CHAIR:  A chair should be able to slide under your desk when your back makes contact with the back of the chair. This allows you to work closely.  The chair's height should allow your eyes to be level with the upper part of your monitor and your hands to be slightly lower than your elbows.  BODY POSITION  Your feet should make contact with the floor. If this is not possible, use a foot rest.  Keep your ears over your shoulders. This will reduce stress on your neck and low back.  INCORRECT SITTING POSTURES  If you are feeling tired and unable to assume a healthy sitting posture, do not slouch or slump. This puts excessive strain on your back tissues, causing more damage and pain. Healthier options include:  Using more support, like a lumbar pillow.  Switching tasks to something that requires you to be upright or walking.  Talking a brief walk.  Lying down to rest in a neutral-spine position.  PROLONGED STANDING WHILE SLIGHTLY LEANING FORWARD  When completing a task that requires you to lean forward while standing in one place for a long time, place either foot up on a stationary 2-4 inch high object to help maintain the best posture. When both feet are on the ground, the lower back tends to lose its slight inward curve. If this curve flattens (or becomes too large), then the  back and your other joints   will experience too much stress, tire more quickly, and can cause pain.  CORRECT STANDING POSTURES Proper standing posture should be assumed with all daily activities, even if they only take a few moments, like when brushing your teeth. As in sitting, your ears should fall over your shoulders and your shoulders should fall over your hips. You should keep a slight tension in your abdominal muscles to brace your spine. Your tailbone should point down to the ground, not behind your body, resulting in an over-extended swayback posture.   INCORRECT STANDING POSTURES  Common incorrect standing postures include a forward head, locked knees and/or an excessive swayback. WALKING Walk with an upright posture. Your ears, shoulders and hips should all line-up.  PROLONGED ACTIVITY IN A FLEXED POSITION When completing a task that requires you to bend forward at your waist or lean over a low surface, try to find a way to stabilize 3 out of 4 of your limbs. You can place a hand or elbow on your thigh or rest a knee on the surface you are reaching across. This will provide you more stability, so that your muscles do not tire as quickly. By keeping your knees relaxed, or slightly bent, you will also reduce stress across your lower back. CORRECT LIFTING TECHNIQUES  DO :  Assume a wide stance. This will provide you more stability and the opportunity to get as close as possible to the object which you are lifting.  Tense your abdominals to brace your spine. Bend at the knees and hips. Keeping your back locked in a neutral-spine position, lift using your leg muscles. Lift with your legs, keeping your back straight.  Test the weight of unknown objects before attempting to lift them.  Try to keep your elbows locked down at your sides in order get the best strength from your shoulders when carrying an object.     Always ask for help when lifting heavy or awkward objects. INCORRECT  LIFTING TECHNIQUES DO NOT:   Lock your knees when lifting, even if it is a small object.  Bend and twist. Pivot at your feet or move your feet when needing to change directions.  Assume that you can safely pick up even a paperclip without proper posture.   

## 2023-07-11 NOTE — Progress Notes (Signed)
 Chief Complaint  Patient presents with   Medication Refill    Medication Check Up    Subjective Jeffrey Rhodes is a 83 y.o. male who presents for hypertension follow up. He does not monitor home blood pressures. He is compliant with medications-Norvasc  10 mg daily, Toprol -XL 25 mg daily. Patient has these side effects of medication: none He is sometimes adhering to a healthy diet overall. Current exercise: active at work, calisthenics, cycling No CP or SOB.   Hypothyroidism Patient presents for follow-up of hypothyroidism.  Reports compliance with medication. Current symptoms include: denies fatigue, weight changes, heat/cold intolerance, bowel/skin changes or CVS symptoms He believes his dose should be not significantly changed  Hyperlipidemia Patient presents for dyslipidemia follow up. Currently being treated with Crestor  40 mg daily, Zetia  10 mg daily and compliance with treatment thus far has been good. He denies myalgias. Diet/exercise as above. The patient is known to have coexisting coronary artery disease.   Past Medical History:  Diagnosis Date   Allergy    Arthritis    Cataract    Coronary artery disease    GERD (gastroesophageal reflux disease)    Glaucoma    Heart murmur    High cholesterol    Hypertension    Hypothyroid    Myocardial infarction (HCC)     Exam BP 124/72 (BP Location: Left Arm, Patient Position: Sitting)   Pulse 97   Temp 98 F (36.7 C) (Oral)   Resp 16   Ht 6\' 1"  (1.854 m)   Wt 216 lb 12.8 oz (98.3 kg)   SpO2 95%   BMI 28.60 kg/m  General:  well developed, well nourished, in no apparent distress Heart: RRR, no bruits, 2+ pitting b/l LE edema tapering at the prox 1/3 of the tibia Lungs: clear to auscultation, no accessory muscle use Neck: No asymmetry, masses, nodules noted Psych: well oriented with normal range of affect and appropriate judgment/insight  Essential hypertension, benign  Hypothyroidism, unspecified type - Plan:  TSH, T4, free  Insomnia, unspecified type  Dyslipidemia  High risk medication use - Plan: Drug Monitoring Panel [phone] , Urine  Chronic pain of both shoulders - Plan: traMADol  (ULTRAM ) 50 MG tablet  Chronic, stable.  Continue Norvasc  10 mg daily, Toprol -XL 25 mg daily.  Counseled on diet and exercise. Chronic, hopefully stable.  Continue levothyroxine  137 mcg daily. Chronic, stable.  Continue Ambien  5 to 10 mg daily as needed.  Update UDS and CSA today. Chronic, only stable.  Continue Zetia  10 mg daily, Crestor  40 mg daily. F/u in 6 months. The patient voiced understanding and agreement to the plan.  Shellie Dials [place], DO 07/11/23  10:05 AM

## 2023-07-12 ENCOUNTER — Ambulatory Visit: Payer: Self-pay | Admitting: Family Medicine

## 2023-07-12 LAB — T4, FREE: Free T4: 1.1 ng/dL (ref 0.60–1.60)

## 2023-07-12 LAB — TSH: TSH: 0.02 u[IU]/mL — ABNORMAL LOW (ref 0.35–5.50)

## 2023-07-13 LAB — DRUG MONITORING PANEL 376104, URINE
Amphetamines: NEGATIVE ng/mL (ref <500)
Barbiturates: NEGATIVE ng/mL (ref <300)
Benzodiazepines: NEGATIVE ng/mL (ref <100)
Cocaine Metabolite: NEGATIVE ng/mL (ref <150)
Desmethyltramadol: NEGATIVE ng/mL (ref <100)
Opiates: NEGATIVE ng/mL (ref <100)
Oxycodone: NEGATIVE ng/mL (ref <100)
Tramadol: NEGATIVE ng/mL (ref <100)

## 2023-07-13 LAB — DM TEMPLATE

## 2023-07-14 ENCOUNTER — Other Ambulatory Visit: Payer: Self-pay

## 2023-07-14 ENCOUNTER — Ambulatory Visit
Admission: EM | Admit: 2023-07-14 | Discharge: 2023-07-14 | Disposition: A | Discharge status: Home / Self Care | Attending: Emergency Medicine | Admitting: Emergency Medicine

## 2023-07-14 DIAGNOSIS — H6123 Impacted cerumen, bilateral: Secondary | ICD-10-CM

## 2023-07-14 NOTE — ED Triage Notes (Signed)
 Pt c/o era fullness bilat, but worse in right ear

## 2023-07-14 NOTE — ED Provider Notes (Signed)
 [facility]    CSN: [id number] Arrival date & time: 07/14/23  1632     History   Chief Complaint Chief Complaint  Patient presents with   Ear Fullness    HPI Nilay Mangrum is a 83 y.o. male.  Patient here with bilateral ear fullness.  Worse in the right ear.  He thinks he has wax buildup.  Reports history of this less than a year ago, needed irrigation. Not having ear pain, drainage, sinus pressure, fever  Past Medical History:  Diagnosis Date   Allergy    Arthritis    Cataract    Coronary artery disease    GERD (gastroesophageal reflux disease)    Glaucoma    Heart murmur    High cholesterol    Hypertension    Hypothyroid    Myocardial infarction [facility])     Patient Active Problem List   Diagnosis Date Noted   Abdominal aortic aneurysm (AAA) without rupture (HCC) 06/09/2020   Coronary artery disease involving native coronary artery of native heart with angina pectoris (HCC) 12/12/2019   Chronic pain of both shoulders 05/31/2018   Benign prostatic hyperplasia 12/12/2017   Prediabetes 12/12/2017   Arthritis 10/10/2017   Chest pain 04/27/2015   Esophageal reflux 10/05/2012   Non-ST elevation MI (NSTEMI) (HCC) 09/18/2012   S/P CABG (coronary artery bypass graft) 09/18/2012   Dyslipidemia 09/18/2012   Sinus arrhythmia 09/18/2012   Essential hypertension, benign 09/18/2012   Hypothyroidism 09/18/2012   Insomnia 09/18/2012    Past Surgical History:  Procedure Laterality Date   CORONARY ARTERY BYPASS GRAFT  2014   [place]   DENTAL RESTORATION/EXTRACTION WITH X-RAY         Home Medications    Prior to Admission medications   Medication Sig Start Date End Date Taking? Authorizing Provider  acetaminophen  (TYLENOL ) 500 MG tablet Take 500 mg by mouth every 8 (eight) hours as needed for mild pain.     [provider]  amLODipine  (NORVASC ) 10 MG tablet TAKE 1 TABLET BY MOUTH EVERY DAY 11/15/22   Gwenette Lennox, Shellie Dials, DO  aspirin  EC 81 MG  tablet Take 81 mg by mouth daily. Swallow whole.    [provider]  ezetimibe  (ZETIA ) 10 MG tablet Take 1 tablet (10 mg total) by mouth daily. 04/06/23   Lenise Quince, MD  fluticasone  (FLONASE ) 50 MCG/ACT nasal spray PLACE 2 SPRAYS INTO BOTH NOSTRILS DAILY AS NEEDED FOR ALLERGIES OR RHINITIS. 03/07/23   Wendling, Shellie Dials, DO  latanoprost (XALATAN) 0.005 % ophthalmic solution Place 1 drop into both eyes at bedtime.    [provider]  levothyroxine  (SYNTHROID ) 137 MCG tablet TAKE 1 TABLET BY MOUTH EVERY DAY BEFORE BREAKFAST 01/03/23   Wendling, Shellie Dials, DO  meloxicam  (MOBIC ) 15 MG tablet TAKE 1 TABLET (15 MG TOTAL) BY MOUTH DAILY. 05/27/23   Jobe Mulder, DO  metoprolol  succinate (TOPROL -XL) 25 MG 24 hr tablet Take 1 tablet (25 mg total) by mouth daily. 01/03/23   Jobe Mulder, DO  OVER THE COUNTER MEDICATION Super Beta Prostate    [provider]  rosuvastatin  (CRESTOR ) 40 MG tablet TAKE 1 TABLET BY MOUTH EVERY DAY 11/15/22   Jobe Mulder, DO  tamsulosin  (FLOMAX ) 0.4 MG CAPS capsule TAKE 1 CAPSULE BY MOUTH EVERY DAY 03/14/23   Jobe Mulder, DO  traMADol  (ULTRAM ) 50 MG tablet TAKE 1 TABLET NIGHTLY AS NEEDED FOR PAIN. 07/11/23   Jobe Mulder, DO  zolpidem  (AMBIEN ) 10 MG  tablet Take 0.5-1 tablets (5-10 mg total) by mouth at bedtime as needed for sleep. 05/12/23   Jobe Mulder, DO    Family History Family History  Problem Relation Age of Onset   Breast cancer Mother    Diabetes Brother    Heart disease Brother    Colon cancer Neg Hx    Esophageal cancer Neg Hx    Rectal cancer Neg Hx    Stomach cancer Neg Hx     Social History Social History   Tobacco Use   Smoking status: Former    Types: Cigarettes   Smokeless tobacco: Never   Tobacco comments:    2003 quit  Vaping Use   Vaping status: Never Used  Substance Use Topics   Alcohol use: Yes    Alcohol/week: 0.0 standard drinks of  alcohol    Comment: occasional   Drug use: No     Allergies   Bee venom, Lisinopril, and Losartan   Review of Systems Review of Systems Per HPI  Physical Exam Triage Vital Signs ED Triage Vitals  Encounter Vitals Group     BP 07/14/23 1731 (!) 152/77     Systolic BP Percentile --      Diastolic BP Percentile --      Pulse Rate 07/14/23 1731 (!) 52     Resp 07/14/23 1731 16     Temp 07/14/23 1731 98 F (36.7 C)     Temp Source 07/14/23 1731 Oral     SpO2 07/14/23 1731 93 %     Weight --      Height --      Head Circumference --      Peak Flow --      Pain Score 07/14/23 1729 0     Pain Loc --      Pain Education --      Exclude from Growth Chart --    No data found.  Updated Vital Signs BP (!) 152/77   Pulse (!) 52   Temp 98 F (36.7 C) (Oral)   Resp 16   SpO2 93%    Physical Exam Vitals and nursing note reviewed.  Constitutional:      Appearance: Normal appearance.  HENT:     Ears:     Comments: Bilateral ears with soft yellow wax. Left ear with more than right    Mouth/Throat:     Mouth: Mucous membranes are moist.     Pharynx: Oropharynx is clear.  Eyes:     Extraocular Movements: Extraocular movements intact.     Conjunctiva/sclera: Conjunctivae normal.     Pupils: Pupils are equal, round, and reactive to light.  Cardiovascular:     Rate and Rhythm: Normal rate and regular rhythm.     Heart sounds: Normal heart sounds.  Pulmonary:     Effort: Pulmonary effort is normal.     Breath sounds: Normal breath sounds.  Musculoskeletal:     Cervical back: Normal range of motion.  Skin:    General: Skin is warm and dry.  Neurological:     Mental Status: He is alert and oriented to person, place, and time.     UC Treatments / Results  Labs (all labs ordered are listed, but only abnormal results are displayed) Labs Reviewed - No data to display  EKG  Radiology No results found.  Procedures Procedures (including critical care  time)  Medications Ordered in UC Medications - No data to display  Initial Impression / Assessment and  Plan / UC Course  I have reviewed the triage vital signs and the nursing notes.  Pertinent labs & imaging results that were available during my care of the patient were reviewed by me and considered in my medical decision making (see chart for details).  Bilateral ear irrigation mostly successful There is still small amount of wax in both ears. Patient reports the left one is much improved, the right does feel better but still fullness. Recommend debrox BID x 5 days. Can return if needed for second irrigation. Patient agrees to plan, no questions   Final Clinical Impressions(s) / UC Diagnoses   Final diagnoses:  Excessive ear wax, bilateral     Discharge Instructions      Debrox ear drops -- 3 drops into both ears, twice daily, for 5 days in a row Please return if needed for additional ear flush out!   ED Prescriptions   None    PDMP not reviewed this encounter.   Creighton Doffing, PA-C 07/14/23 [id number]

## 2023-07-14 NOTE — Discharge Instructions (Addendum)
 Debrox ear drops -- 3 drops into both ears, twice daily, for 5 days in a row Please return if needed for additional ear flush out!

## 2023-08-02 ENCOUNTER — Other Ambulatory Visit: Payer: Self-pay | Admitting: Family Medicine

## 2023-08-02 DIAGNOSIS — E039 Hypothyroidism, unspecified: Secondary | ICD-10-CM

## 2023-09-06 ENCOUNTER — Ambulatory Visit
Admission: EM | Admit: 2023-09-06 | Discharge: 2023-09-06 | Disposition: A | Discharge status: Home / Self Care | Attending: Urgent Care | Admitting: Urgent Care

## 2023-09-06 DIAGNOSIS — H6123 Impacted cerumen, bilateral: Secondary | ICD-10-CM

## 2023-09-06 NOTE — ED Provider Notes (Signed)
 [facility]  Note:  This document was prepared using Conservation officer, historic buildings and may include unintentional dictation errors.  MRN: [id number] DOB: 03/27/1940  Subjective:   Jeffrey Rhodes is a 83 y.o. male presenting for 3-4 week history of recurrent bilateral ear fullness, decreased hearing.  Has previously had difficulty with earwax buildup.  No fever, sinus symptoms, ear pain, ear drainage.  No rashes.  No current facility-administered medications for this encounter.  Current Outpatient Medications:    acetaminophen  (TYLENOL ) 500 MG tablet, Take 500 mg by mouth every 8 (eight) hours as needed for mild pain. , Disp: , Rfl:    amLODipine  (NORVASC ) 10 MG tablet, TAKE 1 TABLET BY MOUTH EVERY DAY, Disp: 90 tablet, Rfl: 2   aspirin  EC 81 MG tablet, Take 81 mg by mouth daily. Swallow whole., Disp: , Rfl:    ezetimibe  (ZETIA ) 10 MG tablet, Take 1 tablet (10 mg total) by mouth daily., Disp: 90 tablet, Rfl: 3   fluticasone  (FLONASE ) 50 MCG/ACT nasal spray, PLACE 2 SPRAYS INTO BOTH NOSTRILS DAILY AS NEEDED FOR ALLERGIES OR RHINITIS., Disp: 48 mL, Rfl: 3   latanoprost (XALATAN) 0.005 % ophthalmic solution, Place 1 drop into both eyes at bedtime., Disp: , Rfl:    levothyroxine  (SYNTHROID ) 137 MCG tablet, TAKE 1 TABLET BY MOUTH EVERY DAY BEFORE BREAKFAST, Disp: 90 tablet, Rfl: 1   meloxicam  (MOBIC ) 15 MG tablet, TAKE 1 TABLET (15 MG TOTAL) BY MOUTH DAILY., Disp: 30 tablet, Rfl: 3   metoprolol  succinate (TOPROL -XL) 25 MG 24 hr tablet, Take 1 tablet (25 mg total) by mouth daily., Disp: 90 tablet, Rfl: 3   OVER THE COUNTER MEDICATION, Super Beta Prostate, Disp: , Rfl:    rosuvastatin  (CRESTOR ) 40 MG tablet, TAKE 1 TABLET BY MOUTH EVERY DAY, Disp: 90 tablet, Rfl: 3   tamsulosin  (FLOMAX ) 0.4 MG CAPS capsule, TAKE 1 CAPSULE BY MOUTH EVERY DAY, Disp: 90 capsule, Rfl: 3   traMADol  (ULTRAM ) 50 MG tablet, TAKE 1 TABLET NIGHTLY AS NEEDED FOR PAIN., Disp: 90 tablet, Rfl: 1    zolpidem  (AMBIEN ) 10 MG tablet, Take 0.5-1 tablets (5-10 mg total) by mouth at bedtime as needed for sleep., Disp: 30 tablet, Rfl: 5   Allergies  Allergen Reactions   Bee Venom Other (See Comments)    unknown unknown    Lisinopril Other (See Comments)    cough Other reaction(s): COUGH Other reaction(s): COUGH cough Other reaction(s): COUGH cough Other reaction(s): COUGH Other reaction(s): COUGH cough Other reaction(s): COUGH Other reaction(s): COUGH cough    Losartan Cough    Past Medical History:  Diagnosis Date   Allergy    Arthritis    Cataract    Coronary artery disease    GERD (gastroesophageal reflux disease)    Glaucoma    Heart murmur    High cholesterol    Hypertension    Hypothyroid    Myocardial infarction [facility])      Past Surgical History:  Procedure Laterality Date   CORONARY ARTERY BYPASS GRAFT  2014   [place]   DENTAL RESTORATION/EXTRACTION WITH X-RAY      Family History  Problem Relation Age of Onset   Breast cancer Mother    Diabetes Brother    Heart disease Brother    Colon cancer Neg Hx    Esophageal cancer Neg Hx    Rectal cancer Neg Hx    Stomach cancer Neg Hx     Social History   Tobacco Use  Smoking status: Former    Types: Cigarettes   Smokeless tobacco: Never   Tobacco comments:    2003 quit  Vaping Use   Vaping status: Never Used  Substance Use Topics   Alcohol use: Yes    Alcohol/week: 0.0 standard drinks of alcohol    Comment: occasional   Drug use: No    ROS   Objective:   Vitals: BP 108/66 (BP Location: Right Arm)   Pulse 64   Temp 98 F (36.7 C) (Oral)   Resp 18   SpO2 94%   Physical Exam Constitutional:      General: He is not in acute distress.    Appearance: Normal appearance. He is well-developed and normal weight. He is not ill-appearing, toxic-appearing or diaphoretic.  HENT:     Head: Normocephalic and atraumatic.     Right Ear: Tympanic membrane, ear canal and external ear normal.  There is impacted cerumen.     Left Ear: Tympanic membrane, ear canal and external ear normal. There is impacted cerumen.     Nose: Nose normal.     Mouth/Throat:     Pharynx: Oropharynx is clear.  Eyes:     General: No scleral icterus.       Right eye: No discharge.        Left eye: No discharge.     Extraocular Movements: Extraocular movements intact.  Cardiovascular:     Rate and Rhythm: Normal rate.  Pulmonary:     Effort: Pulmonary effort is normal.  Musculoskeletal:     Cervical back: Normal range of motion.  Neurological:     Mental Status: He is alert and oriented to person, place, and time.  Psychiatric:        Mood and Affect: Mood normal.        Behavior: Behavior normal.        Thought Content: Thought content normal.        Judgment: Judgment normal.     Ear lavage performed using mixture of peroxide and water.  Pressure irrigation performed using a bottle and a thin ear tube.  Bilateral ear lavage.  No curette was used.   Assessment and Plan :   PDMP not reviewed this encounter.  1. Bilateral impacted cerumen    Successful bilateral ear lavage.  General management of cerumen impaction reviewed with patient.  Anticipatory guidance provided. Counseled patient on potential for adverse effects with medications prescribed/recommended today, ER and return-to-clinic precautions discussed, patient verbalized understanding.    Christopher Savannah, [place] 09/06/23 TRENNA

## 2023-09-06 NOTE — ED Triage Notes (Signed)
 Pt reports bilateral ear fullness  x 3-4 weeks. States he was seeing and was told to use ear drops, but he has no relief.

## 2023-09-23 NOTE — Progress Notes (Deleted)
 HPI: Follow-up coronary artery disease. Patient had coronary artery bypass graft in [place] in 2014. In March 2017 he was admitted with chest pain and ruled out. His symptoms were felt to be atypical. Nuclear study showed an ejection fraction of 58%. No infarct or ischemia. Abd ultrasound 9/19 showed no AAA. Echocardiogram November 2024 showed normal LV function, mild to moderate biatrial enlargement, mild mitral regurgitation, mild to moderate aortic stenosis with mean gradient 14 mmHg and aortic valve area 1.38 cm, mildly dilated aortic root at 40 mm.  Carotid Dopplers November 2024 showed 1 to 39% bilateral stenosis.  Since last seen   Current Outpatient Medications  Medication Sig Dispense Refill   acetaminophen  (TYLENOL ) 500 MG tablet Take 500 mg by mouth every 8 (eight) hours as needed for mild pain.      amLODipine  (NORVASC ) 10 MG tablet TAKE 1 TABLET BY MOUTH EVERY DAY 90 tablet 2   aspirin  EC 81 MG tablet Take 81 mg by mouth daily. Swallow whole.     ezetimibe  (ZETIA ) 10 MG tablet Take 1 tablet (10 mg total) by mouth daily. 90 tablet 3   fluticasone  (FLONASE ) 50 MCG/ACT nasal spray PLACE 2 SPRAYS INTO BOTH NOSTRILS DAILY AS NEEDED FOR ALLERGIES OR RHINITIS. 48 mL 3   latanoprost (XALATAN) 0.005 % ophthalmic solution Place 1 drop into both eyes at bedtime.     levothyroxine  (SYNTHROID ) 137 MCG tablet TAKE 1 TABLET BY MOUTH EVERY DAY BEFORE BREAKFAST 90 tablet 1   meloxicam  (MOBIC ) 15 MG tablet TAKE 1 TABLET (15 MG TOTAL) BY MOUTH DAILY. 30 tablet 3   metoprolol  succinate (TOPROL -XL) 25 MG 24 hr tablet Take 1 tablet (25 mg total) by mouth daily. 90 tablet 3   OVER THE COUNTER MEDICATION Super Beta Prostate     rosuvastatin  (CRESTOR ) 40 MG tablet TAKE 1 TABLET BY MOUTH EVERY DAY 90 tablet 3   tamsulosin  (FLOMAX ) 0.4 MG CAPS capsule TAKE 1 CAPSULE BY MOUTH EVERY DAY 90 capsule 3   traMADol  (ULTRAM ) 50 MG tablet TAKE 1 TABLET NIGHTLY AS NEEDED FOR PAIN. 90 tablet 1   zolpidem   (AMBIEN ) 10 MG tablet Take 0.5-1 tablets (5-10 mg total) by mouth at bedtime as needed for sleep. 30 tablet 5   No current facility-administered medications for this visit.     Past Medical History:  Diagnosis Date   Allergy    Arthritis    Cataract    Coronary artery disease    GERD (gastroesophageal reflux disease)    Glaucoma    Heart murmur    High cholesterol    Hypertension    Hypothyroid    Myocardial infarction [facility])     Past Surgical History:  Procedure Laterality Date   CORONARY ARTERY BYPASS GRAFT  2014   [place]   DENTAL RESTORATION/EXTRACTION WITH X-RAY      Social History   Socioeconomic History   Marital status: Widowed    Spouse name: Not on file   Number of children: 2   Years of education: Not on file   Highest education level: Not on file  Occupational History   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  Tobacco Use   Smoking status: Former    Types: Cigarettes   Smokeless tobacco: Never   Tobacco comments:    2003 quit  Vaping Use   Vaping status: Never Used  Substance and Sexual Activity   Alcohol use: Yes    Alcohol/week: 0.0 standard drinks of alcohol    Comment:  occasional   Drug use: No   Sexual activity: Not Currently  Other Topics Concern   Not on file  Social History Narrative   Not on file   Social Drivers of Health   Financial Resource Strain: Low Risk  (12/28/2022)   Overall Financial Resource Strain (CARDIA)    Difficulty of Paying Living Expenses: Not hard at all  Food Insecurity: No Food Insecurity (12/28/2022)   Hunger Vital Sign    Worried About Running Out of Food in the Last Year: Never true    Ran Out of Food in the Last Year: Never true  Transportation Needs: No Transportation Needs (12/28/2022)   PRAPARE - Administrator, Civil Service (Medical): No    Lack of Transportation (Non-Medical): No  Physical Activity: Insufficiently Active (12/28/2022)   Exercise Vital Sign    Days of Exercise per Week: 7 days     Minutes of Exercise per Session: 20 min  Stress: No Stress Concern Present (12/28/2022)   [employer] of Occupational Health - Occupational Stress Questionnaire    Feeling of Stress : Not at all  Social Connections: Socially Isolated (12/28/2022)   Social Connection and Isolation Panel    Frequency of Communication with Friends and Family: More than three times a week    Frequency of Social Gatherings with Friends and Family: More than three times a week    Attends Religious Services: Never    Database administrator or Organizations: No    Attends Banker Meetings: Never    Marital Status: Widowed  Intimate Partner Violence: Not At Risk (12/28/2022)   Humiliation, Afraid, Rape, and Kick questionnaire    Fear of Current or Ex-Partner: No    Emotionally Abused: No    Physically Abused: No    Sexually Abused: No    Family History  Problem Relation Age of Onset   Breast cancer Mother    Diabetes Brother    Heart disease Brother    Colon cancer Neg Hx    Esophageal cancer Neg Hx    Rectal cancer Neg Hx    Stomach cancer Neg Hx     ROS: no fevers or chills, productive cough, hemoptysis, dysphasia, odynophagia, melena, hematochezia, dysuria, hematuria, rash, seizure activity, orthopnea, PND, pedal edema, claudication. Remaining systems are negative.  Physical Exam: Well-developed well-nourished in no acute distress.  Skin is warm and dry.  HEENT is normal.  Neck is supple.  Chest is clear to auscultation with normal expansion.  Cardiovascular exam is regular rate and rhythm.  Abdominal exam nontender or distended. No masses palpated. Extremities show no edema. neuro grossly intact  ECG- personally reviewed  A/P  1 aortic stenosis-follow-up echocardiogram November 2025.  Patient understands he may require TAVR in the near future.  He also understands the symptoms to be aware of including worsening dyspnea on exertion, chest pain and syncope.  2 coronary  artery disease status post coronary artery bypass and graft-continue aspirin  and statin.  3 hypertension-patient's blood pressure is controlled.  Continue present medical regimen.  4 hyperlipidemia-continue Zetia  and Crestor .  [name], MD

## 2023-09-26 ENCOUNTER — Other Ambulatory Visit: Payer: Self-pay | Admitting: Family Medicine

## 2023-09-28 ENCOUNTER — Encounter: Payer: Self-pay | Admitting: Internal Medicine

## 2023-09-28 ENCOUNTER — Ambulatory Visit (INDEPENDENT_AMBULATORY_CARE_PROVIDER_SITE_OTHER): Admitting: Internal Medicine

## 2023-09-28 ENCOUNTER — Ambulatory Visit (HOSPITAL_BASED_OUTPATIENT_CLINIC_OR_DEPARTMENT_OTHER)
Admission: RE | Admit: 2023-09-28 | Discharge: 2023-09-28 | Disposition: A | Discharge status: Home / Self Care | Source: Ambulatory Visit | Attending: Internal Medicine | Admitting: Internal Medicine

## 2023-09-28 VITALS — BP 126/70 | HR 52 | Temp 98.2°F | Resp 16 | Ht 73.0 in | Wt 215.1 lb

## 2023-09-28 DIAGNOSIS — M5416 Radiculopathy, lumbar region: Secondary | ICD-10-CM | POA: Insufficient documentation

## 2023-09-28 DIAGNOSIS — M5125 Other intervertebral disc displacement, thoracolumbar region: Secondary | ICD-10-CM | POA: Diagnosis not present

## 2023-09-28 DIAGNOSIS — M47817 Spondylosis without myelopathy or radiculopathy, lumbosacral region: Secondary | ICD-10-CM | POA: Diagnosis not present

## 2023-09-28 MED ORDER — PREDNISONE 10 MG PO TABS
ORAL_TABLET | ORAL | 0 refills | Status: DC
Start: 2023-09-28 — End: 2023-10-28

## 2023-09-28 NOTE — Patient Instructions (Addendum)
 Try to rest  Take prednisone  for few days  If you like to take meloxicam  is okay, keep in mind that it may upset your stomach and your kidney. Try to only take it 2-3 times a week at most.  Always take it with food.  Stop by the first floor and get a x-ray  We are referring you to orthopedic doctor

## 2023-09-28 NOTE — Progress Notes (Unsigned)
 Subjective:    Patient ID: Jeffrey Rhodes, male    DOB: 1940/05/29, 83 y.o.   MRN: [id number]  DOS:  09/28/2023 Type of visit - description: Acute  Symptoms started about 3 to 4 months ago. Pain located at the R low back, radiate to the posterior aspect of the tight and then to the inner side of the right foot. Symptoms increased when he stands for long. Unclear if he has claudication, has occasional aches and pains at the calf but again pain increases by standing up.  Went to urgent care twice with similar symptoms, 1 time was prescribed prednisone  with temporary help. Denies any injury. No rash anywhere in the lower extremity. No edema.  Review of Systems See above   Past Medical History:  Diagnosis Date   Allergy    Arthritis    Cataract    Coronary artery disease    GERD (gastroesophageal reflux disease)    Glaucoma    Heart murmur    High cholesterol    Hypertension    Hypothyroid    Myocardial infarction [facility])     Past Surgical History:  Procedure Laterality Date   CORONARY ARTERY BYPASS GRAFT  2014   [place]   DENTAL RESTORATION/EXTRACTION WITH X-RAY      Current Outpatient Medications  Medication Instructions   acetaminophen  (TYLENOL ) 500 mg, Every 8 hours PRN   amLODipine  (NORVASC ) 10 mg, Oral, Daily   aspirin  EC 81 mg, Daily   ezetimibe  (ZETIA ) 10 mg, Oral, Daily   fluticasone  (FLONASE ) 50 MCG/ACT nasal spray 2 sprays, Each Nare, Daily PRN   latanoprost (XALATAN) 0.005 % ophthalmic solution 1 drop, Daily at bedtime   levothyroxine  (SYNTHROID ) 137 MCG tablet TAKE 1 TABLET BY MOUTH EVERY DAY BEFORE BREAKFAST   meloxicam  (MOBIC ) 15 mg, Oral, Daily   metoprolol  succinate (TOPROL -XL) 25 mg, Oral, Daily   OVER THE COUNTER MEDICATION Super Beta Prostate   rosuvastatin  (CRESTOR ) 40 mg, Oral, Daily   tamsulosin  (FLOMAX ) 0.4 mg, Oral, Daily   traMADol  (ULTRAM ) 50 MG tablet TAKE 1 TABLET NIGHTLY AS NEEDED FOR PAIN.   zolpidem  (AMBIEN ) 5-10 mg, Oral, At  bedtime PRN       Objective:   Physical Exam BP 126/70   Pulse (!) 52   Temp 98.2 F (36.8 C) (Oral)   Resp 16   Ht 6' 1 (1.854 m)   Wt 215 lb 2 oz (97.6 kg)   SpO2 96%   BMI 28.38 kg/m  General:   Well developed, NAD, BMI noted.  HEENT:  Normocephalic . Face symmetric, atraumatic   Abdomen:  Not distended, soft, no murmur.   Skin: Not pale. Not jaundice Lower extremities: No edema. Femoral and pedal pulses present. Toes well-perfused. Neurologic:  alert & oriented X3.  Speech normal, gait appropriate for age and unassisted DTR symmetric.  Motor appropriate for age Psych--  Cognition and judgment appear intact.  Cooperative with normal attention span and concentration.  Behavior appropriate. No anxious or depressed appearing.     Assessment     83 year old gentleman, PMH includes high cholesterol, hypertension, CAD history of CABG, hypothyroidism, presents with  Lumbar pain with radicular symptoms. Symptoms started 3 to 4 months ago, no injury, no rash. L4 radiculopathy? Previously prednisone  helped.  Tylenol , tramadol : No help. Vascular exam benign nevertheless at some point may need ABIs if MSK treatment and workup negative. Plan: Run of prednisone , meloxicam  is on his medication list.  Recommend to minimize use with GI precautions.  Plain x-ray.  Ortho referral.  May need further evaluation, MRI?  Local injection?.   Chronic shoulder pain, saw PCP May 2025, on Ultram .

## 2023-10-04 DIAGNOSIS — M545 Low back pain, unspecified: Secondary | ICD-10-CM | POA: Diagnosis not present

## 2023-10-05 ENCOUNTER — Ambulatory Visit: Admitting: Cardiology

## 2023-10-12 ENCOUNTER — Ambulatory Visit: Payer: Self-pay | Admitting: Internal Medicine

## 2023-10-13 ENCOUNTER — Ambulatory Visit
Admission: RE | Admit: 2023-10-13 | Discharge: 2023-10-13 | Disposition: A | Discharge status: Home / Self Care | Source: Ambulatory Visit | Attending: Family Medicine | Admitting: Family Medicine

## 2023-10-13 VITALS — BP 142/76 | HR 67 | Temp 97.8°F | Resp 16

## 2023-10-13 DIAGNOSIS — N3000 Acute cystitis without hematuria: Secondary | ICD-10-CM | POA: Insufficient documentation

## 2023-10-13 DIAGNOSIS — R300 Dysuria: Secondary | ICD-10-CM | POA: Insufficient documentation

## 2023-10-13 LAB — POCT URINE DIPSTICK
Bilirubin, UA: NEGATIVE
Blood, UA: NEGATIVE
Glucose, UA: NEGATIVE mg/dL
Ketones, POC UA: NEGATIVE mg/dL
Leukocytes, UA: NEGATIVE
Nitrite, UA: NEGATIVE
Protein Ur, POC: NEGATIVE mg/dL
Spec Grav, UA: 1.015 (ref 1.010–1.025)
Urobilinogen, UA: 0.2 U/dL
pH, UA: 5 (ref 5.0–8.0)

## 2023-10-13 MED ORDER — SULFAMETHOXAZOLE-TRIMETHOPRIM 800-160 MG PO TABS: 1.0000 | ORAL_TABLET | Freq: Two times a day (BID) | ORAL | 0 refills | Status: DC

## 2023-10-13 NOTE — Discharge Instructions (Addendum)
Please start Bactrim to address an urinary tract infection. Make sure you hydrate very well with plain water and a quantity of 80 ounces of water a day.  Please limit drinks that are considered urinary irritants such as soda, sweet tea, coffee, energy drinks, alcohol.  These can worsen your urinary and genital symptoms but also be the source of them.  I will let you know about your urine culture results through MyChart to see if we need to prescribe or change your antibiotics based off of those results.  

## 2023-10-13 NOTE — ED Provider Notes (Signed)
 [facility]  Note:  This document was prepared using Conservation officer, historic buildings and may include unintentional dictation errors.  MRN: [id number] DOB: April 13, 1940  Subjective:   Jeffrey Rhodes is a 83 y.o. male presenting for 3 days of acute onset dysuria, urinary frequency and urgency, right flank/low back pain.  Patient admits that he is not drinking much water.  He does sodas twice daily.  Denies hematuria, penile discharge, penile swelling, testicular pain, testicular swelling, anal pain, groin pain.   No current facility-administered medications for this encounter.  Current Outpatient Medications:    acetaminophen  (TYLENOL ) 500 MG tablet, Take 500 mg by mouth every 8 (eight) hours as needed for mild pain. , Disp: , Rfl:    amLODipine  (NORVASC ) 10 MG tablet, TAKE 1 TABLET BY MOUTH EVERY DAY, Disp: 90 tablet, Rfl: 2   aspirin  EC 81 MG tablet, Take 81 mg by mouth daily. Swallow whole., Disp: , Rfl:    ezetimibe  (ZETIA ) 10 MG tablet, Take 1 tablet (10 mg total) by mouth daily., Disp: 90 tablet, Rfl: 3   fluticasone  (FLONASE ) 50 MCG/ACT nasal spray, PLACE 2 SPRAYS INTO BOTH NOSTRILS DAILY AS NEEDED FOR ALLERGIES OR RHINITIS., Disp: 48 mL, Rfl: 3   latanoprost (XALATAN) 0.005 % ophthalmic solution, Place 1 drop into both eyes at bedtime., Disp: , Rfl:    levothyroxine  (SYNTHROID ) 137 MCG tablet, TAKE 1 TABLET BY MOUTH EVERY DAY BEFORE BREAKFAST, Disp: 90 tablet, Rfl: 1   meloxicam  (MOBIC ) 15 MG tablet, TAKE 1 TABLET (15 MG TOTAL) BY MOUTH DAILY., Disp: 30 tablet, Rfl: 3   metoprolol  succinate (TOPROL -XL) 25 MG 24 hr tablet, Take 1 tablet (25 mg total) by mouth daily., Disp: 90 tablet, Rfl: 3   OVER THE COUNTER MEDICATION, Super Beta Prostate, Disp: , Rfl:    predniSONE  (DELTASONE ) 10 MG tablet, 4 tablets x 2 days, 3 tabs x 2 days, 2 tabs x 2 days, 1 tab x 2 days, Disp: 20 tablet, Rfl: 0   rosuvastatin  (CRESTOR ) 40 MG tablet, TAKE 1 TABLET BY MOUTH EVERY DAY,  Disp: 90 tablet, Rfl: 3   tamsulosin  (FLOMAX ) 0.4 MG CAPS capsule, TAKE 1 CAPSULE BY MOUTH EVERY DAY, Disp: 90 capsule, Rfl: 3   traMADol  (ULTRAM ) 50 MG tablet, TAKE 1 TABLET NIGHTLY AS NEEDED FOR PAIN., Disp: 90 tablet, Rfl: 1   zolpidem  (AMBIEN ) 10 MG tablet, Take 0.5-1 tablets (5-10 mg total) by mouth at bedtime as needed for sleep., Disp: 30 tablet, Rfl: 5   Allergies  Allergen Reactions   Bee Venom Other (See Comments)    unknown unknown    Lisinopril Other (See Comments)    cough Other reaction(s): COUGH Other reaction(s): COUGH cough Other reaction(s): COUGH cough Other reaction(s): COUGH Other reaction(s): COUGH cough Other reaction(s): COUGH Other reaction(s): COUGH cough    Losartan Cough    Past Medical History:  Diagnosis Date   Allergy    Arthritis    Cataract    Coronary artery disease    GERD (gastroesophageal reflux disease)    Glaucoma    Heart murmur    High cholesterol    Hypertension    Hypothyroid    Myocardial infarction [facility])      Past Surgical History:  Procedure Laterality Date   CORONARY ARTERY BYPASS GRAFT  2014   [place]   DENTAL RESTORATION/EXTRACTION WITH X-RAY      Family History  Problem Relation Age of Onset   Breast cancer Mother  Diabetes Brother    Heart disease Brother    Colon cancer Neg Hx    Esophageal cancer Neg Hx    Rectal cancer Neg Hx    Stomach cancer Neg Hx     Social History   Tobacco Use   Smoking status: Former    Types: Cigarettes   Smokeless tobacco: Never   Tobacco comments:    2003 quit  Vaping Use   Vaping status: Never Used  Substance Use Topics   Alcohol use: Yes    Alcohol/week: 0.0 standard drinks of alcohol    Comment: occasional   Drug use: No    ROS   Objective:   Vitals: BP (!) 142/76 (BP Location: Right Arm)   Pulse 67   Temp 97.8 F (36.6 C) (Oral)   Resp 16   SpO2 94%   Physical Exam Constitutional:      General: He is not in acute distress.     Appearance: Normal appearance. He is well-developed and normal weight. He is not ill-appearing, toxic-appearing or diaphoretic.  HENT:     Head: Normocephalic and atraumatic.     Right Ear: External ear normal.     Left Ear: External ear normal.     Nose: Nose normal.     Mouth/Throat:     Pharynx: Oropharynx is clear.  Eyes:     General: No scleral icterus.       Right eye: No discharge.        Left eye: No discharge.     Extraocular Movements: Extraocular movements intact.  Cardiovascular:     Rate and Rhythm: Normal rate.  Pulmonary:     Effort: Pulmonary effort is normal.  Abdominal:     General: Bowel sounds are normal. There is no distension.     Palpations: Abdomen is soft. There is no mass.     Tenderness: There is no abdominal tenderness. There is no right CVA tenderness, left CVA tenderness, guarding or rebound.  Musculoskeletal:     Cervical back: Normal range of motion.  Neurological:     Mental Status: He is alert and oriented to person, place, and time.  Psychiatric:        Mood and Affect: Mood normal.        Behavior: Behavior normal.        Thought Content: Thought content normal.        Judgment: Judgment normal.     Results for orders placed or performed during the hospital encounter of 10/13/23 (from the past 24 hours)  POCT URINE DIPSTICK     Status: Normal   Collection Time: 10/13/23  4:58 PM  Result Value Ref Range   Color, UA yellow yellow   Clarity, UA clear clear   Glucose, UA negative negative mg/dL   Bilirubin, UA negative negative   Ketones, POC UA negative negative mg/dL   Spec Grav, UA 8.984 8.989 - 1.025   Blood, UA negative negative   pH, UA 5.0 5.0 - 8.0   Protein Ur, POC negative negative mg/dL   Urobilinogen, UA 0.2 0.2 or 1.0 E.U./dL   Nitrite, UA Negative Negative   Leukocytes, UA Negative Negative    Assessment and Plan :   PDMP not reviewed this encounter.  1. Acute cystitis without hematuria   2. Dysuria       Creatinine clearance calculated at 5mL/min-89mL/min using the last creatinine level available from 01/03/2023. Start Bactrim  to cover for acute cystitis, urine culture pending.  Recommended  aggressive hydration, limiting urinary irritants. Counseled patient on potential for adverse effects with medications prescribed/recommended today, ER and return-to-clinic precautions discussed, patient verbalized understanding.    Christopher Savannah, [place] 10/13/23 [id number]

## 2023-10-13 NOTE — ED Triage Notes (Signed)
 Pt c/o dysuria, freq, right flank pain x 3 days-denies fever-no meds PTA-NAD-steady gait

## 2023-10-15 LAB — URINE CULTURE: Culture: 10000 — AB

## 2023-10-17 ENCOUNTER — Ambulatory Visit (HOSPITAL_COMMUNITY): Payer: Self-pay

## 2023-10-17 DIAGNOSIS — M545 Low back pain, unspecified: Secondary | ICD-10-CM | POA: Diagnosis not present

## 2023-10-17 DIAGNOSIS — M25562 Pain in left knee: Secondary | ICD-10-CM | POA: Diagnosis not present

## 2023-10-17 MED ORDER — NITROFURANTOIN MONOHYD MACRO 100 MG PO CAPS: 100.0000 mg | ORAL_CAPSULE | Freq: Two times a day (BID) | ORAL | 0 refills | Status: DC

## 2023-10-26 ENCOUNTER — Ambulatory Visit: Payer: Self-pay

## 2023-10-26 NOTE — Telephone Encounter (Signed)
  FYI Only or Action Required?: Action required by provider: medication refill request and update on patient condition.  Patient was last seen in primary care on 09/28/2023 by Amon Aloysius BRAVO, MD.  Called Nurse Triage reporting Medication Problem.  Symptoms began a week ago.  Interventions attempted: Prescription medications: macrobid .  Symptoms are: gradually improving.  Triage Disposition:   Patient/caregiver understands and will follow disposition?:  Copied from CRM #[id number]. Topic: Clinical - Red Word Triage >> Oct 26, 2023 11:32 AM Deleta RAMAN wrote: Red Word that prompted transfer to Nurse Triage: patient has kidney issues states he has bathroom issues. Answer Assessment - Initial Assessment Questions 1. NAME of MEDICINE: What medicine(s) are you calling about?     Macrobid  100 mg BID x 5 days 2. QUESTION: What is your question? (e.g., double dose of medicine, side effect)     Patient prescribed Macrobid  for UTI on 10/17/2023.  Patient dropped his medication in water  3. PRESCRIBER: Who prescribed the medicine? Reason: if prescribed by specialist, call should be referred to that group.     [facility] provider, Bette Dural, PA-C 4. SYMPTOMS: Do you have any symptoms? If Yes, ask: What symptoms are you having?  How bad are the symptoms (e.g., mild, moderate, severe)     Symptoms began to improve, but he stopped taking medication after a couple of days.  Patient unsure of how many doses were taken.     Patient requests REFILL of Macrobid  to complete treatment of UTI  Protocols used: Medication Question Call-A-AH

## 2023-10-27 ENCOUNTER — Telehealth: Payer: Self-pay

## 2023-10-27 NOTE — Telephone Encounter (Signed)
 Can be discussed tomorrow at appt      Copied from CRM [id number]. Topic: Clinical - Medication Question >> Oct 26, 2023  4:52 PM Jasmin G wrote: Reason for CRM: Pt called to get on the status of his Macrobid  100 mg, I informed him of usual 24 hour turnaround time for prescriptions and that refill request was sent to Dr. Gena nurses and is still pending for approval, pt requested a call back at [phone] to let him know when med gets to be refilled.

## 2023-10-28 ENCOUNTER — Ambulatory Visit (INDEPENDENT_AMBULATORY_CARE_PROVIDER_SITE_OTHER): Admitting: Family Medicine

## 2023-10-28 ENCOUNTER — Encounter: Payer: Self-pay | Admitting: Family Medicine

## 2023-10-28 ENCOUNTER — Other Ambulatory Visit: Payer: Self-pay | Admitting: Family Medicine

## 2023-10-28 VITALS — BP 138/84 | HR 80 | Temp 98.4°F | Resp 18 | Ht 73.0 in | Wt 218.4 lb

## 2023-10-28 DIAGNOSIS — R300 Dysuria: Secondary | ICD-10-CM

## 2023-10-28 DIAGNOSIS — N3000 Acute cystitis without hematuria: Secondary | ICD-10-CM

## 2023-10-28 DIAGNOSIS — I10 Essential (primary) hypertension: Secondary | ICD-10-CM

## 2023-10-28 LAB — POCT URINALYSIS DIPSTICK
Bilirubin, UA: NEGATIVE
Blood, UA: NEGATIVE
Glucose, UA: POSITIVE — AB
Ketones, UA: NEGATIVE
Leukocytes, UA: NEGATIVE
Nitrite, UA: NEGATIVE
Protein, UA: NEGATIVE
Spec Grav, UA: 1.025 (ref 1.010–1.025)
Urobilinogen, UA: 0.2 U/dL
pH, UA: 5 (ref 5.0–8.0)

## 2023-10-28 MED ORDER — NITROFURANTOIN MONOHYD MACRO 100 MG PO CAPS: 100.0000 mg | ORAL_CAPSULE | Freq: Two times a day (BID) | ORAL | 0 refills | Status: AC

## 2023-10-28 NOTE — Patient Instructions (Addendum)
Stay hydrated.   Warning signs/symptoms: Uncontrollable nausea/vomiting, fevers, worsening symptoms despite treatment, confusion.  Give [place] around 2 business days to get culture back to you.  Take Metamucil or Benefiber daily.  Let us know if you need anything.

## 2023-10-28 NOTE — Progress Notes (Signed)
 Chief Complaint  Patient presents with   Urinary Tract Infection    Pt diagnosed with UTI- didn't finish antibiotics     Jeffrey Rhodes is a 83 y.o. male here for possible UTI.  Duration: 2 weeks. Symptoms: Dysuria, urinary frequency, urinary retention, and slight constipation.  He is not circumcised.  Denies: hematuria, urinary hesitancy, fever, nausea, vomiting, urgency, flank pain, discharge Hx of recurrent UTI? No Denies new sexual partners. Cx showed small amount of bacteria, susceptible to Macrobid . He took 2 pills and the rest fell down the sink.   Past Medical History:  Diagnosis Date   Allergy    Arthritis    Cataract    Coronary artery disease    GERD (gastroesophageal reflux disease)    Glaucoma    Heart murmur    High cholesterol    Hypertension    Hypothyroid    Myocardial infarction (HCC)      BP 138/84 (BP Location: Left Arm, Cuff Size: Normal)   Pulse 80   Temp 98.4 F (36.9 C)   Resp 18   Ht 6' 1 (1.854 m)   Wt 218 lb 6.4 oz (99.1 kg)   SpO2 95%   BMI 28.81 kg/m  General: Awake, alert, appears stated age Heart: RRR Lungs: CTAB, normal respiratory effort, no accessory muscle usage Abd: BS+, soft, NT, ND, no masses or organomegaly MSK: No CVA tenderness, neg Lloyd's sign Psych: Age appropriate judgment and insight  Acute cystitis without hematuria - Plan: nitrofurantoin , macrocrystal-monohydrate, (MACROBID ) 100 MG capsule  Dysuria - Plan: POCT Urinalysis Dipstick, Urine Culture  Stay hydrated.  Will resend 7 days of Macrobid . Seek immediate care if pt starts to develop fevers, new/worsening symptoms, uncontrollable N/V. F/u prn. The patient voiced understanding and agreement to the plan.  Mabel Mt Thayer, DO 10/28/23 2:31 PM

## 2023-10-29 DIAGNOSIS — H40029 Open angle with borderline findings, high risk, unspecified eye: Secondary | ICD-10-CM | POA: Diagnosis not present

## 2023-10-29 LAB — URINE CULTURE
MICRO NUMBER:: 16929080
SPECIMEN QUALITY:: ADEQUATE

## 2023-10-30 ENCOUNTER — Ambulatory Visit: Payer: Self-pay | Admitting: Family Medicine

## 2023-11-06 ENCOUNTER — Other Ambulatory Visit: Payer: Self-pay | Admitting: Family Medicine

## 2023-11-06 DIAGNOSIS — G4700 Insomnia, unspecified: Secondary | ICD-10-CM

## 2023-11-07 MED ORDER — ZOLPIDEM TARTRATE 10 MG PO TABS: 5.0000 mg | ORAL_TABLET | Freq: Every evening | ORAL | 5 refills | Status: DC | PRN

## 2023-11-07 NOTE — Addendum Note (Signed)
 Addended by: FRANN MABEL SQUIBB on: 11/07/2023 07:25 AM   Modules accepted: Orders

## 2023-11-22 DIAGNOSIS — M545 Low back pain, unspecified: Secondary | ICD-10-CM | POA: Diagnosis not present

## 2023-11-25 DIAGNOSIS — M4807 Spinal stenosis, lumbosacral region: Secondary | ICD-10-CM | POA: Diagnosis not present

## 2023-11-25 DIAGNOSIS — M5459 Other low back pain: Secondary | ICD-10-CM | POA: Diagnosis not present

## 2023-11-27 ENCOUNTER — Other Ambulatory Visit: Payer: Self-pay | Admitting: Family Medicine

## 2023-11-27 DIAGNOSIS — E785 Hyperlipidemia, unspecified: Secondary | ICD-10-CM

## 2023-11-30 ENCOUNTER — Telehealth: Payer: Self-pay

## 2023-11-30 ENCOUNTER — Other Ambulatory Visit: Payer: Self-pay | Admitting: Neurological Surgery

## 2023-11-30 DIAGNOSIS — M5116 Intervertebral disc disorders with radiculopathy, lumbar region: Secondary | ICD-10-CM | POA: Diagnosis not present

## 2023-11-30 DIAGNOSIS — M4316 Spondylolisthesis, lumbar region: Secondary | ICD-10-CM | POA: Diagnosis not present

## 2023-11-30 DIAGNOSIS — M5416 Radiculopathy, lumbar region: Secondary | ICD-10-CM | POA: Diagnosis not present

## 2023-11-30 NOTE — Telephone Encounter (Signed)
 Med Rec and Consent done.    Patient Consent for Virtual Visit        Jeffrey Rhodes has provided verbal consent on 11/30/2023 for a virtual visit (video or telephone).   CONSENT FOR VIRTUAL VISIT FOR:  Jeffrey Rhodes  By participating in this virtual visit I agree to the following:  I hereby voluntarily request, consent and authorize [facility] and its employed or contracted physicians, physician assistants, nurse practitioners or other licensed health care professionals (the Practitioner), to provide me with telemedicine health care services (the "Services) as deemed necessary by the treating Practitioner. I acknowledge and consent to receive the Services by the Practitioner via telemedicine. I understand that the telemedicine visit will involve communicating with the Practitioner through live audiovisual communication technology and the disclosure of certain medical information by electronic transmission. I acknowledge that I have been given the opportunity to request an in-person assessment or other available alternative prior to the telemedicine visit and am voluntarily participating in the telemedicine visit.  I understand that I have the right to withhold or withdraw my consent to the use of telemedicine in the course of my care at any time, without affecting my right to future care or treatment, and that the Practitioner or I may terminate the telemedicine visit at any time. I understand that I have the right to inspect all information obtained and/or recorded in the course of the telemedicine visit and may receive copies of available information for a reasonable fee.  I understand that some of the potential risks of receiving the Services via telemedicine include:  Delay or interruption in medical evaluation due to technological equipment failure or disruption; Information transmitted may not be sufficient (e.g. poor resolution of images) to allow for appropriate medical  decision making by the Practitioner; and/or  In rare instances, security protocols could fail, causing a breach of personal health information.  Furthermore, I acknowledge that it is my responsibility to provide information about my medical history, conditions and care that is complete and accurate to the best of my ability. I acknowledge that Practitioner's advice, recommendations, and/or decision may be based on factors not within their control, such as incomplete or inaccurate data provided by me or distortions of diagnostic images or specimens that may result from electronic transmissions. I understand that the practice of medicine is not an exact science and that Practitioner makes no warranties or guarantees regarding treatment outcomes. I acknowledge that a copy of this consent can be made available to me via my patient portal [facility] MyChart), or I can request a printed copy by calling the office of [facility].    I understand that my insurance will be billed for this visit.   I have read or had this consent read to me. I understand the contents of this consent, which adequately explains the benefits and risks of the Services being provided via telemedicine.  I have been provided ample opportunity to ask questions regarding this consent and the Services and have had my questions answered to my satisfaction. I give my informed consent for the services to be provided through the use of telemedicine in my medical care

## 2023-11-30 NOTE — Telephone Encounter (Signed)
 1st attempt: Left message for pt to call our office and ask for the preop team to schedule TELE Preop appt.

## 2023-11-30 NOTE — Telephone Encounter (Signed)
 S/W pt and scheduled TELE Preop appt 12/02/23. Med Rec and Consent done.  Will update the surgeons office.

## 2023-11-30 NOTE — Telephone Encounter (Signed)
   Pre-operative Risk Assessment    Patient Name: Jeffrey Rhodes  DOB: 10/10/1940 MRN: [id number]   Date of last office visit: 04/06/23 [name], MD Date of next office visit: 01/18/24 [name], MD   Request for Surgical Clearance    Procedure:  PLIF - POSTERIOR LATERAL AND INTERBODY FUSION - L3-L4 - L4-L5  Date of Surgery:  Clearance TBD    PER REQUEST URGENT NEED TO SCHEDULE ASAP                            Surgeon:  DR VICTORY GENS Surgeon's Group or Practice Name:  [facility] Phone number:  [phone]   EXT 8221 Fax number:  [phone]   OR   [phone]   ATTN: KATIE   Type of Clearance Requested:   - Medical  - Pharmacy:  Hold Aspirin      Type of Anesthesia:  Not Indicated   Additional requests/questions:    SignedLucie DELENA Ku   11/30/2023, 2:27 PM '

## 2023-11-30 NOTE — Telephone Encounter (Signed)
   Name: Jeffrey Rhodes  DOB: 10-15-1940  MRN: [id number]  Primary Cardiologist: [name], MD   Preoperative team, please contact this patient and set up a phone call appointment for further preoperative risk assessment. Please obtain consent and complete medication review. Thank you for your help.  I confirm that guidance regarding antiplatelet and oral anticoagulation therapy has been completed and, if necessary, noted below.  Per office protocol, if patient is without any new symptoms or concerns at the time of their virtual visit, he may hold ASA for 7 days prior to procedure. Please resume ASA as soon as possible postprocedure, at the discretion of the surgeon.    I also confirmed the patient resides in the state of [place] . As per [facility] Medical Board telemedicine laws, the patient must reside in the state in which the provider is licensed.   Lamarr Satterfield, NP 11/30/2023, 2:52 PM [facility]

## 2023-12-01 NOTE — Progress Notes (Unsigned)
 Virtual Visit via Telephone Note   Because of Jeffrey Rhodes co-morbid illnesses, he is at least at moderate risk for complications without adequate follow up.  This format is felt to be most appropriate for this patient at this time.  Due to technical limitations with video connection (technology), today's appointment will be conducted as an audio only telehealth visit, and Jeffrey Rhodes verbally agreed to proceed in this manner.   All issues noted in this document were discussed and addressed.  No physical exam could be performed with this format.  Evaluation Performed:  Preoperative cardiovascular risk assessment _____________   Date:  12/01/2023   Patient ID:  Jeffrey Rhodes, DOB 1940/10/28, MRN [id number] Patient Location:  Home Provider location:   Office  Primary Care Provider:  Frann Mabel Mt, DO Primary Cardiologist:  [name], MD  Chief Complaint / Patient Profile   83 y.o. y/o male with a h/o coronary artery disease, HTN, hyperlipidemia who is pending PLIF - POSTERIOR LATERAL AND INTERBODY FUSION - L3-L4 - L4-L5 and presents today for telephonic preoperative cardiovascular risk assessment.  History of Present Illness    Jeffrey Rhodes is a 83 y.o. male who presents via audio/video conferencing for a telehealth visit today.  Pt was last seen in cardiology clinic on 04/06/2023 by Dr. Shallow.  At that time Jeffrey Rhodes was doing well .  The patient is now pending procedure as outlined above. Since his last visit, he remains stable from a cardiac standpoint.  Today he denies chest pain, shortness of breath, lower extremity edema, fatigue, palpitations, melena, hematuria, hemoptysis, diaphoresis, weakness, presyncope, syncope, orthopnea, and PND.   Past Medical History    Past Medical History:  Diagnosis Date   Allergy    Arthritis    Cataract    Coronary artery disease    GERD (gastroesophageal reflux disease)    Glaucoma    Heart murmur    High  cholesterol    Hypertension    Hypothyroid    Myocardial infarction [facility])    Past Surgical History:  Procedure Laterality Date   CORONARY ARTERY BYPASS GRAFT  2014   [place]   DENTAL RESTORATION/EXTRACTION WITH X-RAY      Allergies  Allergies  Allergen Reactions   Bee Venom Other (See Comments)    unknown unknown    Lisinopril Other (See Comments)    cough Other reaction(s): COUGH Other reaction(s): COUGH cough Other reaction(s): COUGH cough Other reaction(s): COUGH Other reaction(s): COUGH cough Other reaction(s): COUGH Other reaction(s): COUGH cough    Losartan Cough    Home Medications    Prior to Admission medications   Medication Sig Start Date End Date Taking? Authorizing Provider  acetaminophen  (TYLENOL ) 500 MG tablet Take 500 mg by mouth every 8 (eight) hours as needed for mild pain.     [provider]  amLODipine  (NORVASC ) 10 MG tablet TAKE 1 TABLET BY MOUTH EVERY DAY 10/28/23   Frann, Mabel Mt, DO  aspirin  EC 81 MG tablet Take 81 mg by mouth daily. Swallow whole.    [provider]  ezetimibe  (ZETIA ) 10 MG tablet Take 1 tablet (10 mg total) by mouth daily. 04/06/23   Shallow Redell RAMAN, MD  fluticasone  (FLONASE ) 50 MCG/ACT nasal spray PLACE 2 SPRAYS INTO BOTH NOSTRILS DAILY AS NEEDED FOR ALLERGIES OR RHINITIS. 03/07/23   Wendling, Mabel Mt, DO  latanoprost (XALATAN) 0.005 % ophthalmic solution Place 1 drop into both eyes at bedtime.  [provider]  levothyroxine  (SYNTHROID ) 137 MCG tablet TAKE 1 TABLET BY MOUTH EVERY DAY BEFORE BREAKFAST 08/02/23   Wendling, Mabel Mt, DO  metoprolol  succinate (TOPROL -XL) 25 MG 24 hr tablet Take 1 tablet (25 mg total) by mouth daily. 01/03/23   Frann Mabel Mt, DO  OVER THE COUNTER MEDICATION Super Beta Prostate    [provider]  rosuvastatin  (CRESTOR ) 40 MG tablet TAKE 1 TABLET BY MOUTH EVERY DAY 11/28/23   Wendling, Mabel Mt, DO   sulfamethoxazole -trimethoprim  (BACTRIM  DS) 800-160 MG tablet Take 1 tablet by mouth 2 (two) times daily. 10/13/23   Christopher Savannah, PA-C  tamsulosin  (FLOMAX ) 0.4 MG CAPS capsule TAKE 1 CAPSULE BY MOUTH EVERY DAY 03/14/23   Frann Mabel Mt, DO  traMADol  (ULTRAM ) 50 MG tablet TAKE 1 TABLET NIGHTLY AS NEEDED FOR PAIN. 07/11/23   Frann Mabel Mt, DO  zolpidem  (AMBIEN ) 10 MG tablet Take 0.5-1 tablets (5-10 mg total) by mouth at bedtime as needed. for sleep 11/07/23   Frann Mabel Mt, DO    Physical Exam    Vital Signs:  Jeffrey Rhodes does not have vital signs available for review today.  Given telephonic nature of communication, physical exam is limited. AAOx3. NAD. Normal affect.  Speech and respirations are unlabored.  Accessory Clinical Findings    None  Assessment & Plan    1.  Preoperative Cardiovascular Risk Assessment: PLIF - POSTERIOR LATERAL AND INTERBODY FUSION - L3-L4 - L4-L5   Date of Surgery:  Clearance TBD    PER REQUEST URGENT NEED TO SCHEDULE ASAP                              Surgeon:  DR VICTORY GENS Surgeon's Group or Practice Name:  [facility] Phone number:  [phone]   EXT 8221 Fax number:  [phone]   OR   [phone]      Primary Cardiologist: [name], MD  Chart reviewed as part of pre-operative protocol coverage. Given past medical history and time since last visit, based on ACC/AHA guidelines, Jeffrey Rhodes would be at acceptable risk for the planned procedure without further cardiovascular testing.   His RCRI is low risk, 0.9% risk of major cardiac event.  He is able to complete greater than 4 METS of physical activity.  Patient was advised that if he develops new symptoms prior to surgery to contact our office to arrange a follow-up appointment.  He verbalized understanding.  Per office protocol, if patient is without any new symptoms or concerns at the time of their virtual visit, he may hold ASA for 7  days prior to procedure. Please resume ASA as soon as possible postprocedure, at the discretion of the surgeon.   I will route this recommendation to the requesting party via Epic fax function and remove from pre-op pool.       Time:   Today, I have spent 5 minutes with the patient with telehealth technology discussing medical history, symptoms, and management plan.  I spent 10 minutes reviewing patient's past cardiac history and cardiac medications.    Jeffrey CHRISTELLA Beauvais, Jeffrey Rhodes  12/01/2023, 4:17 PM

## 2023-12-02 ENCOUNTER — Ambulatory Visit

## 2023-12-02 DIAGNOSIS — Z01810 Encounter for preprocedural cardiovascular examination: Secondary | ICD-10-CM | POA: Diagnosis not present

## 2023-12-06 ENCOUNTER — Telehealth (HOSPITAL_COMMUNITY): Payer: Self-pay | Admitting: Cardiology

## 2023-12-06 ENCOUNTER — Other Ambulatory Visit: Payer: Self-pay | Admitting: *Deleted

## 2023-12-06 DIAGNOSIS — I350 Nonrheumatic aortic (valve) stenosis: Secondary | ICD-10-CM

## 2023-12-06 NOTE — Telephone Encounter (Signed)
 I called to schedule the ordered echocardiogram and patient declines to schedule at this time due to him having back surgery. He states that he has appt soon with MD and will discuss then. Order will be removed from the active echo Wq and if patient decides to do we can reinstate the order. Thank you.

## 2023-12-07 ENCOUNTER — Other Ambulatory Visit: Payer: Self-pay | Admitting: Neurological Surgery

## 2023-12-07 ENCOUNTER — Ambulatory Visit (HOSPITAL_COMMUNITY)
Admission: RE | Admit: 2023-12-07 | Discharge: 2023-12-07 | Disposition: A | Discharge status: Home / Self Care | Source: Ambulatory Visit | Attending: Cardiology | Admitting: Cardiology

## 2023-12-07 ENCOUNTER — Other Ambulatory Visit: Payer: Self-pay | Admitting: Family Medicine

## 2023-12-07 ENCOUNTER — Ambulatory Visit: Payer: Self-pay | Admitting: Cardiology

## 2023-12-07 DIAGNOSIS — G4700 Insomnia, unspecified: Secondary | ICD-10-CM

## 2023-12-07 DIAGNOSIS — I350 Nonrheumatic aortic (valve) stenosis: Secondary | ICD-10-CM

## 2023-12-07 DIAGNOSIS — I082 Rheumatic disorders of both aortic and tricuspid valves: Secondary | ICD-10-CM | POA: Diagnosis not present

## 2023-12-07 DIAGNOSIS — I517 Cardiomegaly: Secondary | ICD-10-CM | POA: Insufficient documentation

## 2023-12-07 LAB — ECHOCARDIOGRAM COMPLETE
AR max vel: 1.25 cm2
AV Area VTI: 1.1 cm2
AV Area mean vel: 1.4 cm2
AV Mean grad: 16.8 mmHg
AV Peak grad: 35.9 mmHg
Ao pk vel: 2.99 m/s
Area-P 1/2: 3.47 cm2
S' Lateral: 2.9 cm

## 2023-12-07 MED ORDER — ZOLPIDEM TARTRATE 10 MG PO TABS: 5.0000 mg | ORAL_TABLET | Freq: Every evening | ORAL | 5 refills | Status: DC | PRN

## 2023-12-07 NOTE — Progress Notes (Signed)
 Surgical Instructions   Your procedure is scheduled on Monday, October 20th, 2025. Report to [facility] Main Entrance A at 10:30 A.M., then check in with the Admitting office. Any questions or running late day of surgery: call [phone]  Questions prior to your surgery date: call [phone], Monday-Friday, 8am-4pm. If you experience any cold or flu symptoms such as cough, fever, chills, shortness of breath, etc. between now and your scheduled surgery, please notify us  at the above number.     Remember:  Do not eat or drink after midnight the night before your surgery    Take these medicines the morning of surgery with A SIP OF WATER: Amlodipine  (Norvasc ) Ezetimibe  (Zetia ) Levothyroxine  (Synthroid ) Metoprolol  Succinate (Toprol -XL) Rosuvastatin  (Crestor ) Tamsulosin  (Flomax )   May take these medicines IF NEEDED: Fluticasone  (Flonase ) Oxycodone-acetaminophen  (Percocet)   Per your cardiologist, stop taking your Aspirin  7 days prior to your surgery.  Your last dose of Aspirin  should be on Monday, October 13th.    One week prior to surgery, STOP taking any Aleve, Naproxen, Ibuprofen, Motrin, Advil, Goody's, BC's, all herbal medications, fish oil, and non-prescription vitamins.                     Do NOT Smoke (Tobacco/Vaping) for 24 hours prior to your procedure.  If you use a CPAP at night, you may bring your mask/headgear for your overnight stay.   You will be asked to remove any contacts, glasses, piercing's, hearing aid's, dentures/partials prior to surgery. Please bring cases for these items if needed.    Patients discharged the day of surgery will not be allowed to drive home, and someone needs to stay with them for 24 hours.  SURGICAL WAITING ROOM VISITATION Patients may have no more than 2 support people in the waiting area - these visitors may rotate.   Pre-op nurse will coordinate an appropriate time for 1 ADULT support person, who may not rotate, to accompany  patient in pre-op.  Children under the age of 50 must have an adult with them who is not the patient and must remain in the main waiting area with an adult.  If the patient needs to stay at the hospital during part of their recovery, the visitor guidelines for inpatient rooms apply.  Please refer to the [facility] website for the visitor guidelines for any additional information.   If you received a COVID test during your pre-op visit  it is requested that you wear a mask when out in public, stay away from anyone that may not be feeling well and notify your surgeon if you develop symptoms. If you have been in contact with anyone that has tested positive in the last 10 days please notify you surgeon.      Pre-operative 4 CHG Bathing Instructions   You can play a key role in reducing the risk of infection after surgery. Your skin needs to be as free of germs as possible. You can reduce the number of germs on your skin by washing with CHG (chlorhexidine gluconate) soap before surgery. CHG is an antiseptic soap that kills germs and continues to kill germs even after washing.   DO NOT use if you have an allergy to chlorhexidine/CHG or antibacterial soaps. If your skin becomes reddened or irritated, stop using the CHG and notify one of our RNs at [phone].   Please shower with the CHG soap starting 4 days before surgery using the following schedule:     Please keep in  mind the following:  DO NOT shave, including legs and underarms, starting the day of your first shower.   You may shave your face at any point before/day of surgery.  Place clean sheets on your bed the day you start using CHG soap. Use a clean washcloth (not used since being washed) for each shower. DO NOT sleep with pets once you start using the CHG.   CHG Shower Instructions:  Wash your face and private area with normal soap. If you choose to wash your hair, wash first with your normal shampoo.  After you use  shampoo/soap, rinse your hair and body thoroughly to remove shampoo/soap residue.  Turn the water OFF and apply  bottle of CHG soap to a CLEAN washcloth.  Apply CHG soap ONLY FROM YOUR NECK DOWN TO YOUR TOES (washing for 3-5 minutes)  DO NOT use CHG soap on face, private areas, open wounds, or sores.  Pay special attention to the area where your surgery is being performed.  If you are having back surgery, having someone wash your back for you may be helpful. Wait 2 minutes after CHG soap is applied, then you may rinse off the CHG soap.  Pat dry with a clean towel  Put on clean clothes/pajamas   If you choose to wear lotion, please use ONLY the CHG-compatible lotions that are listed below.  Additional instructions for the day of surgery:  If you choose, you may shower the morning of surgery with an antibacterial soap.  DO NOT APPLY any lotions, deodorants, cologne, or perfumes.   Do not bring valuables to the hospital. [facility] is not responsible for any belongings/valuables. Do not wear nail polish, gel polish, artificial nails, or any other type of covering on natural nails (fingers and toes) Do not wear jewelry or makeup Put on clean/comfortable clothes.  Please brush your teeth.  Ask your nurse before applying any prescription medications to the skin.     CHG Compatible Lotions   Aveeno Moisturizing lotion  Cetaphil Moisturizing Cream  Cetaphil Moisturizing Lotion  Clairol Herbal Essence Moisturizing Lotion, Dry Skin  Clairol Herbal Essence Moisturizing Lotion, Extra Dry Skin  Clairol Herbal Essence Moisturizing Lotion, Normal Skin  Curel Age Defying Therapeutic Moisturizing Lotion with Alpha Hydroxy  Curel Extreme Care Body Lotion  Curel Soothing Hands Moisturizing Hand Lotion  Curel Therapeutic Moisturizing Cream, Fragrance-Free  Curel Therapeutic Moisturizing Lotion, Fragrance-Free  Curel Therapeutic Moisturizing Lotion, Original Formula  Eucerin Daily Replenishing  Lotion  Eucerin Dry Skin Therapy Plus Alpha Hydroxy Crme  Eucerin Dry Skin Therapy Plus Alpha Hydroxy Lotion  Eucerin Original Crme  Eucerin Original Lotion  Eucerin Plus Crme Eucerin Plus Lotion  Eucerin TriLipid Replenishing Lotion  Keri Anti-Bacterial Hand Lotion  Keri Deep Conditioning Original Lotion Dry Skin Formula Softly Scented  Keri Deep Conditioning Original Lotion, Fragrance Free Sensitive Skin Formula  Keri Lotion Fast Absorbing Fragrance Free Sensitive Skin Formula  Keri Lotion Fast Absorbing Softly Scented Dry Skin Formula  Keri Original Lotion  Keri Skin Renewal Lotion Keri Silky Smooth Lotion  Keri Silky Smooth Sensitive Skin Lotion  Nivea Body Creamy Conditioning Oil  Nivea Body Extra Enriched Lotion  Nivea Body Original Lotion  Nivea Body Sheer Moisturizing Lotion Nivea Crme  Nivea Skin Firming Lotion  NutraDerm 30 Skin Lotion  NutraDerm Skin Lotion  NutraDerm Therapeutic Skin Cream  NutraDerm Therapeutic Skin Lotion  ProShield Protective Hand Cream  Provon moisturizing lotion  Please read over the following fact sheets that you were given.

## 2023-12-07 NOTE — Telephone Encounter (Signed)
 Copied from CRM [id number]. Topic: Clinical - Medication Refill >> Dec 07, 2023  9:04 AM Burnard DEL wrote: Medication: zolpidem  (AMBIEN ) 10 MG tablet  Has the patient contacted their pharmacy? Yes (Agent: If no, request that the patient contact the pharmacy for the refill. If patient does not wish to contact the pharmacy document the reason why and proceed with request.) (Agent: If yes, when and what did the pharmacy advise?)  This is the patient's preferred pharmacy:   [id number] - [place], [place] - [address]  Phone: [phone] Fax: [phone]     Is this the correct pharmacy for this prescription? Yes If no, delete pharmacy and type the correct one.   Has the prescription been filled recently? No  Is the patient out of the medication? Yes  Has the patient been seen for an appointment in the last year OR does the patient have an upcoming appointment? Yes  Can we respond through MyChart? Yes  Agent: Please be advised that Rx refills may take up to 3 business days. We ask that you follow-up with your pharmacy.  **Patient has [facility] using [facility] [place] ,and would like for prescription to be sent to new pharmacy in [place].

## 2023-12-08 ENCOUNTER — Encounter (HOSPITAL_COMMUNITY): Payer: Self-pay

## 2023-12-08 ENCOUNTER — Encounter (HOSPITAL_COMMUNITY)
Admission: RE | Admit: 2023-12-08 | Discharge: 2023-12-08 | Disposition: A | Discharge status: Home / Self Care | Source: Ambulatory Visit | Attending: Neurological Surgery | Admitting: Neurological Surgery

## 2023-12-08 ENCOUNTER — Other Ambulatory Visit: Payer: Self-pay

## 2023-12-08 VITALS — BP 141/75 | HR 97 | Temp 98.2°F | Resp 16 | Ht 72.0 in | Wt 203.0 lb

## 2023-12-08 DIAGNOSIS — I2510 Atherosclerotic heart disease of native coronary artery without angina pectoris: Secondary | ICD-10-CM | POA: Insufficient documentation

## 2023-12-08 DIAGNOSIS — Z951 Presence of aortocoronary bypass graft: Secondary | ICD-10-CM | POA: Insufficient documentation

## 2023-12-08 DIAGNOSIS — Z01818 Encounter for other preprocedural examination: Secondary | ICD-10-CM

## 2023-12-08 DIAGNOSIS — Z01812 Encounter for preprocedural laboratory examination: Secondary | ICD-10-CM | POA: Diagnosis not present

## 2023-12-08 DIAGNOSIS — E785 Hyperlipidemia, unspecified: Secondary | ICD-10-CM | POA: Insufficient documentation

## 2023-12-08 DIAGNOSIS — I10 Essential (primary) hypertension: Secondary | ICD-10-CM | POA: Diagnosis not present

## 2023-12-08 LAB — TYPE AND SCREEN
ABO/RH(D): O POS
Antibody Screen: NEGATIVE

## 2023-12-08 LAB — BASIC METABOLIC PANEL WITH GFR
Anion gap: 10 (ref 5–15)
BUN: 36 mg/dL — ABNORMAL HIGH (ref 8–23)
CO2: 23 mmol/L (ref 22–32)
Calcium: 8.8 mg/dL — ABNORMAL LOW (ref 8.9–10.3)
Chloride: 103 mmol/L (ref 98–111)
Creatinine, Ser: 1.05 mg/dL (ref 0.61–1.24)
GFR, Estimated: >60 mL/min (ref >60)
Glucose, Bld: 154 mg/dL — ABNORMAL HIGH (ref 70–99)
Potassium: 4 mmol/L (ref 3.5–5.1)
Sodium: 136 mmol/L (ref 135–145)

## 2023-12-08 LAB — CBC
HCT: 42.6 % (ref 39.0–52.0)
Hemoglobin: 14.7 g/dL (ref 13.0–17.0)
MCH: 31.5 pg (ref 26.0–34.0)
MCHC: 34.5 g/dL (ref 30.0–36.0)
MCV: 91.2 fL (ref 80.0–100.0)
Platelets: 291 K/uL (ref 150–400)
RBC: 4.67 MIL/uL (ref 4.22–5.81)
RDW: 13.1 % (ref 11.5–15.5)
WBC: 12.8 K/uL — ABNORMAL HIGH (ref 4.0–10.5)
nRBC: 0 % (ref 0.0–0.2)

## 2023-12-08 LAB — SURGICAL PCR SCREEN
MRSA, PCR: NEGATIVE
Staphylococcus aureus: NEGATIVE

## 2023-12-08 NOTE — Progress Notes (Signed)
 PCP - Mabel Pry Cardiologist - Dr. [name]  PPM/ICD - denies Device Orders - n/a Rep Notified - n/a  Chest x-ray - denies EKG - 12/14/22 Stress Test - 04/28/15 ECHO - 01/07/23 Cardiac Cath - denies  Sleep Study - denies CPAP - n/a  No DM  Last dose of GLP1 agonist-  n/a GLP1 instructions:  n/a  Blood Thinner Instructions:  n/a Aspirin  Instructions: per cardiologist, stop Aspirin  7 days prior to surgery; last dose was 10/11  ERAS Protcol - NPO PRE-SURGERY Ensure or G2- n/a  COVID TEST- n/a   Anesthesia review: yes - cardiac history; CABG - 2014  Patient denies shortness of breath, fever, cough and chest pain at PAT appointment   All instructions explained to the patient, with a verbal understanding of the material. Patient agrees to go over the instructions while at home for a better understanding. Patient also instructed to self quarantine after being tested for COVID-19. The opportunity to ask questions was provided.

## 2023-12-11 NOTE — Progress Notes (Signed)
 Anesthesia Chart Review:  83 yo male follows with cardiology for hx of HTN, HLD, CAD s/p CABG 2014, mild to moderate AS. Preop eval by Josefa Beauvais, NP on 12/02/23 states, Chart reviewed as part of pre-operative protocol coverage. Given past medical history and time since last visit, based on ACC/AHA guidelines, FRANCOIS ELK would be at acceptable risk for the planned procedure without further cardiovascular testing. His RCRI is low risk, 0.9% risk of major cardiac event.  He is able to complete greater than 4 METS of physical activity. Patient was advised that if he develops new symptoms prior to surgery to contact our office to arrange a follow-up appointment.  He verbalized understanding.  Dr. Pietro subsequently ordered updated echo which was done 12/07/23 and showed LVEF 60-65%, normal RV, mild to moderate AS (MG , AVA 1.10 cm2).  Other pertinent hx includes hypothyrodism, GERD  Preop labs reviewed, unremarkable.  EKG 12/14/22: Sinus bradycardia with 1st degree A-V block with Premature atrial complexes. Rate 56. Right bundle branch block. Inferior Q waves noted. T wave inversions in leads III and aVF. T wave abnoramlities in leads V2-V3. Slight Down-slowing ST segment in V3-V4. No significant ST/ T wave changes compared to prior tracings.  TTE 12/07/23:  1. Left ventricular ejection fraction, by estimation, is 60 to 65%. The  left ventricle has normal function. The left ventricle has no regional  wall motion abnormalities. There is mild left ventricular hypertrophy.  Left ventricular diastolic parameters  were normal.   2. Right ventricular systolic function is normal. The right ventricular  size is normal. There is normal pulmonary artery systolic pressure. The  estimated right ventricular systolic pressure is 25.5 mmHg.   3. The mitral valve is degenerative. No evidence of mitral valve  regurgitation. No evidence of mitral stenosis.   4. Native aortic valve, trileaflet, NCC  is less mobile compared to  RCC/LCC, calcified leaflets, no regurgitation, mild to moderate aortic  stenosis (peak velocity 85m/s, MG , AVA VTI 1.10cm2, DI 0.29).   5. The inferior vena cava is normal in size with greater than 50%  respiratory variability, suggesting right atrial pressure of 3 mmHg.   Comparison(s): A prior study was performed on 01/07/2023. LVEF 55-60%,  moderate LAE and RAE, mild MR, mild/moderate AS, aortic root 40mm and  ascending aorta 38mm.   Nuclear stress 04/28/2015: IMPRESSION: 1. No scintigraphic evidence of prior infarction or pharmacologically induced ischemia.   2. Normal left ventricular wall motion.   3. Left ventricular ejection fraction 58%   4. Low-risk stress test findings*.    Jabier, Deese [facility]/Anesthesiology Phone [phone] 12/11/2023 8:53 PM

## 2023-12-11 NOTE — Anesthesia Preprocedure Evaluation (Signed)
 Anesthesia Evaluation  Patient identified by MRN, date of birth, ID band Patient awake    Reviewed: Allergy & Precautions, NPO status , Patient's Chart, lab work & pertinent test results  Airway Mallampati: IV  TM Distance: <3 FB Neck ROM: Full    Dental  (+) Dental Advisory Given   Pulmonary former smoker   breath sounds clear to auscultation       Cardiovascular hypertension, Pt. on medications and Pt. on home beta blockers + CAD, + Past MI and + CABG  + Valvular Problems/Murmurs AS  Rhythm:Regular Rate:Normal     Neuro/Psych negative neurological ROS     GI/Hepatic Neg liver ROS,GERD  ,,  Endo/Other  Hypothyroidism    Renal/GU negative Renal ROS     Musculoskeletal  (+) Arthritis ,    Abdominal   Peds  Hematology negative hematology ROS (+)   Anesthesia Other Findings   Reproductive/Obstetrics                              Anesthesia Physical Anesthesia Plan  ASA: 3  Anesthesia Plan: General   Post-op Pain Management: Ofirmev  IV (intra-op)*   Induction: Intravenous  PONV Risk Score and Plan: 2 and Dexamethasone , Ondansetron  and Treatment may vary due to age or medical condition  Airway Management Planned: Oral ETT  Additional Equipment:   Intra-op Plan:   Post-operative Plan: Extubation in OR  Informed Consent: I have reviewed the patients History and Physical, chart, labs and discussed the procedure including the risks, benefits and alternatives for the proposed anesthesia with the patient or authorized representative who has indicated his/her understanding and acceptance.     Dental advisory given  Plan Discussed with: CRNA  Anesthesia Plan Comments: (PAT note by Lynwood Hope, PA-C:  83 yo male follows with cardiology for hx of HTN, HLD, CAD s/p CABG 2014, mild to moderate AS. Preop eval by Josefa Beauvais, NP on 12/02/23 states, Chart reviewed as part of  pre-operative protocol coverage. Given past medical history and time since last visit, based on ACC/AHA guidelines, Jeffrey Rhodes would be at acceptable risk for the planned procedure without further cardiovascular testing. His RCRI is low risk, 0.9% risk of major cardiac event.  He is able to complete greater than 4 METS of physical activity. Patient was advised that if he develops new symptoms prior to surgery to contact our office to arrange a follow-up appointment.  He verbalized understanding.  Dr. Pietro subsequently ordered updated echo which was done 12/07/23 and showed LVEF 60-65%, normal RV, mild to moderate AS (MG , AVA 1.10 cm2).  Other pertinent hx includes hypothyrodism, GERD  Preop labs reviewed, unremarkable.  EKG 12/14/22: Sinus bradycardia with 1st degree A-V block with Premature atrial complexes. Rate 56. Right bundle branch block. Inferior Q waves noted. T wave inversions in leads III and aVF. T wave abnoramlities in leads V2-V3. Slight Down-slowing ST segment in V3-V4. No significant ST/ T wave changes compared to prior tracings.  TTE 12/07/23: 1. Left ventricular ejection fraction, by estimation, is 60 to 65%. The  left ventricle has normal function. The left ventricle has no regional  wall motion abnormalities. There is mild left ventricular hypertrophy.  Left ventricular diastolic parameters  were normal.  2. Right ventricular systolic function is normal. The right ventricular  size is normal. There is normal pulmonary artery systolic pressure. The  estimated right ventricular systolic pressure is 25.5 mmHg.  3. The mitral  valve is degenerative. No evidence of mitral valve  regurgitation. No evidence of mitral stenosis.  4. Native aortic valve, trileaflet, NCC is less mobile compared to  RCC/LCC, calcified leaflets, no regurgitation, mild to moderate aortic  stenosis (peak velocity 70m/s, MG , AVA VTI 1.10cm2, DI 0.29).  5. The inferior vena cava is  normal in size with greater than 50%  respiratory variability, suggesting right atrial pressure of 3 mmHg.   Comparison(s): A prior study was performed on 01/07/2023. LVEF 55-60%,  moderate LAE and RAE, mild MR, mild/moderate AS, aortic root 40mm and  ascending aorta 38mm.   Nuclear stress 04/28/2015: IMPRESSION: 1. No scintigraphic evidence of prior infarction or pharmacologically induced ischemia.  2. Normal left ventricular wall motion.  3. Left ventricular ejection fraction 58%  4. Low-risk stress test findings*.   )         Anesthesia Quick Evaluation

## 2023-12-12 ENCOUNTER — Ambulatory Visit (HOSPITAL_COMMUNITY)

## 2023-12-12 ENCOUNTER — Observation Stay (HOSPITAL_COMMUNITY)
Admission: RE | Admit: 2023-12-12 | Discharge: 2023-12-14 | Disposition: A | Discharge status: Home / Self Care | Attending: Neurological Surgery | Admitting: Neurological Surgery

## 2023-12-12 ENCOUNTER — Encounter (HOSPITAL_COMMUNITY)
Admission: RE | Disposition: A | Discharge status: Home / Self Care | Payer: Self-pay | Source: Home / Self Care | Attending: Neurological Surgery

## 2023-12-12 ENCOUNTER — Ambulatory Visit (HOSPITAL_COMMUNITY): Payer: Self-pay | Admitting: Physician Assistant

## 2023-12-12 DIAGNOSIS — Z87891 Personal history of nicotine dependence: Secondary | ICD-10-CM | POA: Insufficient documentation

## 2023-12-12 DIAGNOSIS — Z981 Arthrodesis status: Secondary | ICD-10-CM | POA: Diagnosis not present

## 2023-12-12 DIAGNOSIS — I10 Essential (primary) hypertension: Secondary | ICD-10-CM | POA: Diagnosis not present

## 2023-12-12 DIAGNOSIS — I252 Old myocardial infarction: Secondary | ICD-10-CM | POA: Diagnosis not present

## 2023-12-12 DIAGNOSIS — Z951 Presence of aortocoronary bypass graft: Secondary | ICD-10-CM | POA: Diagnosis not present

## 2023-12-12 DIAGNOSIS — M4316 Spondylolisthesis, lumbar region: Secondary | ICD-10-CM

## 2023-12-12 DIAGNOSIS — Z79899 Other long term (current) drug therapy: Secondary | ICD-10-CM | POA: Diagnosis not present

## 2023-12-12 DIAGNOSIS — E039 Hypothyroidism, unspecified: Secondary | ICD-10-CM | POA: Diagnosis not present

## 2023-12-12 DIAGNOSIS — M5416 Radiculopathy, lumbar region: Secondary | ICD-10-CM | POA: Diagnosis not present

## 2023-12-12 DIAGNOSIS — M48062 Spinal stenosis, lumbar region with neurogenic claudication: Secondary | ICD-10-CM | POA: Diagnosis not present

## 2023-12-12 DIAGNOSIS — Z7982 Long term (current) use of aspirin: Secondary | ICD-10-CM | POA: Diagnosis not present

## 2023-12-12 DIAGNOSIS — M47816 Spondylosis without myelopathy or radiculopathy, lumbar region: Secondary | ICD-10-CM | POA: Diagnosis not present

## 2023-12-12 DIAGNOSIS — I2510 Atherosclerotic heart disease of native coronary artery without angina pectoris: Secondary | ICD-10-CM | POA: Diagnosis not present

## 2023-12-12 LAB — ABO/RH: ABO/RH(D): O POS

## 2023-12-12 SURGERY — POSTERIOR LUMBAR FUSION 2 LEVEL
Anesthesia: General | Site: Back

## 2023-12-12 MED ORDER — FENTANYL CITRATE (PF) 250 MCG/5ML IJ SOLN
INTRAMUSCULAR | Status: DC | PRN
  Administered 2023-12-12 (×2): 50 ug via INTRAVENOUS
  Administered 2023-12-12: 100 ug via INTRAVENOUS
  Administered 2023-12-12: 50 ug via INTRAVENOUS

## 2023-12-12 MED ORDER — ACETAMINOPHEN 500 MG PO TABS: 1000.0000 mg | ORAL_TABLET | Freq: Once | ORAL | Status: DC

## 2023-12-12 MED ORDER — OXYCODONE HCL 5 MG/5ML PO SOLN: 5.0000 mg | Freq: Once | ORAL | Status: DC | PRN

## 2023-12-12 MED ORDER — PHENYLEPHRINE 80 MCG/ML (10ML) SYRINGE FOR IV PUSH (FOR BLOOD PRESSURE SUPPORT)
PREFILLED_SYRINGE | INTRAVENOUS | Status: AC
Start: 2023-12-12 — End: 2023-12-12
  Filled 2023-12-12: qty 10

## 2023-12-12 MED ORDER — DEXAMETHASONE SOD PHOSPHATE PF 10 MG/ML IJ SOLN
INTRAMUSCULAR | Status: DC | PRN
  Administered 2023-12-12: 10 mg via INTRAVENOUS

## 2023-12-12 MED ORDER — LIDOCAINE-EPINEPHRINE 1 %-1:100000 IJ SOLN
INTRAMUSCULAR | Status: DC | PRN
  Administered 2023-12-12: 5 mL

## 2023-12-12 MED ORDER — PHENYLEPHRINE HCL-NACL 20-0.9 MG/250ML-% IV SOLN
INTRAVENOUS | Status: DC | PRN
  Administered 2023-12-12: 20 ug/min via INTRAVENOUS

## 2023-12-12 MED ORDER — FENTANYL CITRATE (PF) 100 MCG/2ML IJ SOLN
INTRAMUSCULAR | Status: AC
  Filled 2023-12-12: qty 2

## 2023-12-12 MED ORDER — METOPROLOL SUCCINATE ER 25 MG PO TB24
25.0000 mg | ORAL_TABLET | Freq: Every day | ORAL | Status: DC
Start: 2023-12-13 — End: 2023-12-14
  Administered 2023-12-13 – 2023-12-14 (×2): 25 mg via ORAL
  Filled 2023-12-12 (×2): qty 1

## 2023-12-12 MED ORDER — OXYCODONE HCL 5 MG PO TABS: 5.0000 mg | ORAL_TABLET | Freq: Once | ORAL | Status: DC | PRN

## 2023-12-12 MED ORDER — HYDROMORPHONE HCL 1 MG/ML IJ SOLN
INTRAMUSCULAR | Status: DC | PRN
  Administered 2023-12-12: .25 mg via INTRAVENOUS

## 2023-12-12 MED ORDER — ROCURONIUM BROMIDE 10 MG/ML (PF) SYRINGE
PREFILLED_SYRINGE | INTRAVENOUS | Status: AC
Start: 2023-12-12 — End: 2023-12-12
  Filled 2023-12-12: qty 10

## 2023-12-12 MED ORDER — ALBUMIN HUMAN 5 % IV SOLN: INTRAVENOUS | Status: DC | PRN

## 2023-12-12 MED ORDER — ACETAMINOPHEN 325 MG PO TABS: 650.0000 mg | ORAL_TABLET | ORAL | Status: DC | PRN

## 2023-12-12 MED ORDER — SENNA 8.6 MG PO TABS
1.0000 | ORAL_TABLET | Freq: Two times a day (BID) | ORAL | Status: DC
  Administered 2023-12-12 – 2023-12-13 (×2): 8.6 mg via ORAL
  Filled 2023-12-12 (×3): qty 1

## 2023-12-12 MED ORDER — ROCURONIUM BROMIDE 10 MG/ML (PF) SYRINGE
PREFILLED_SYRINGE | INTRAVENOUS | Status: AC
  Filled 2023-12-12: qty 10

## 2023-12-12 MED ORDER — LEVOTHYROXINE SODIUM 137 MCG PO TABS
137.0000 ug | ORAL_TABLET | Freq: Every day | ORAL | Status: DC
  Administered 2023-12-13 – 2023-12-14 (×2): 137 ug via ORAL
  Filled 2023-12-12 (×2): qty 1

## 2023-12-12 MED ORDER — ROSUVASTATIN CALCIUM 20 MG PO TABS
40.0000 mg | ORAL_TABLET | Freq: Every day | ORAL | Status: DC
Start: 2023-12-12 — End: 2023-12-14
  Administered 2023-12-12 – 2023-12-14 (×3): 40 mg via ORAL
  Filled 2023-12-12 (×3): qty 2

## 2023-12-12 MED ORDER — ROCURONIUM BROMIDE 10 MG/ML (PF) SYRINGE
PREFILLED_SYRINGE | INTRAVENOUS | Status: DC | PRN
  Administered 2023-12-12: 30 mg via INTRAVENOUS
  Administered 2023-12-12: 50 mg via INTRAVENOUS
  Administered 2023-12-12: 10 mg via INTRAVENOUS
  Administered 2023-12-12: 20 mg via INTRAVENOUS

## 2023-12-12 MED ORDER — BISACODYL 10 MG RE SUPP: 10.0000 mg | Freq: Every day | RECTAL | Status: DC | PRN

## 2023-12-12 MED ORDER — METHOCARBAMOL 500 MG PO TABS
500.0000 mg | ORAL_TABLET | Freq: Four times a day (QID) | ORAL | Status: DC | PRN
  Administered 2023-12-12 – 2023-12-14 (×4): 500 mg via ORAL
  Filled 2023-12-12 (×4): qty 1

## 2023-12-12 MED ORDER — MENTHOL 3 MG MT LOZG: 1.0000 | LOZENGE | OROMUCOSAL | Status: DC | PRN

## 2023-12-12 MED ORDER — CHLORHEXIDINE GLUCONATE CLOTH 2 % EX PADS: 6.0000 | MEDICATED_PAD | Freq: Once | CUTANEOUS | Status: DC

## 2023-12-12 MED ORDER — HYDROMORPHONE HCL 1 MG/ML IJ SOLN
INTRAMUSCULAR | Status: AC
  Filled 2023-12-12: qty 0.5

## 2023-12-12 MED ORDER — HYDROMORPHONE HCL 1 MG/ML IJ SOLN: 0.5000 mg | INTRAMUSCULAR | Status: DC | PRN

## 2023-12-12 MED ORDER — SODIUM CHLORIDE 0.9% FLUSH: 3.0000 mL | INTRAVENOUS | Status: DC | PRN

## 2023-12-12 MED ORDER — TAMSULOSIN HCL 0.4 MG PO CAPS
0.4000 mg | ORAL_CAPSULE | Freq: Every day | ORAL | Status: DC
Start: 2023-12-12 — End: 2023-12-14
  Administered 2023-12-13 – 2023-12-14 (×2): 0.4 mg via ORAL
  Filled 2023-12-12 (×3): qty 1

## 2023-12-12 MED ORDER — METOPROLOL SUCCINATE ER 25 MG PO TB24
25.0000 mg | ORAL_TABLET | Freq: Every day | ORAL | Status: DC
  Administered 2023-12-12: 25 mg via ORAL
  Filled 2023-12-12: qty 1

## 2023-12-12 MED ORDER — ONDANSETRON HCL 4 MG/2ML IJ SOLN: 4.0000 mg | Freq: Four times a day (QID) | INTRAMUSCULAR | Status: DC | PRN

## 2023-12-12 MED ORDER — ZOLPIDEM TARTRATE 5 MG PO TABS
5.0000 mg | ORAL_TABLET | Freq: Every evening | ORAL | Status: DC | PRN
  Administered 2023-12-12 – 2023-12-13 (×2): 10 mg via ORAL
  Filled 2023-12-12 (×2): qty 2

## 2023-12-12 MED ORDER — PROPOFOL 10 MG/ML IV BOLUS
INTRAVENOUS | Status: DC | PRN
  Administered 2023-12-12: 120 mg via INTRAVENOUS

## 2023-12-12 MED ORDER — LIDOCAINE-EPINEPHRINE 1 %-1:100000 IJ SOLN
INTRAMUSCULAR | Status: AC
  Filled 2023-12-12: qty 1

## 2023-12-12 MED ORDER — CEFAZOLIN SODIUM-DEXTROSE 2-4 GM/100ML-% IV SOLN
INTRAVENOUS | Status: AC
  Filled 2023-12-12: qty 100

## 2023-12-12 MED ORDER — EZETIMIBE 10 MG PO TABS
10.0000 mg | ORAL_TABLET | Freq: Every day | ORAL | Status: DC
Start: 2023-12-12 — End: 2023-12-14
  Administered 2023-12-12 – 2023-12-14 (×3): 10 mg via ORAL
  Filled 2023-12-12 (×3): qty 1

## 2023-12-12 MED ORDER — DOCUSATE SODIUM 100 MG PO CAPS
100.0000 mg | ORAL_CAPSULE | Freq: Two times a day (BID) | ORAL | Status: DC
  Administered 2023-12-12 – 2023-12-13 (×2): 100 mg via ORAL
  Filled 2023-12-12 (×2): qty 1

## 2023-12-12 MED ORDER — ONDANSETRON HCL 4 MG PO TABS: 4.0000 mg | ORAL_TABLET | Freq: Four times a day (QID) | ORAL | Status: DC | PRN

## 2023-12-12 MED ORDER — CEFAZOLIN SODIUM-DEXTROSE 2-4 GM/100ML-% IV SOLN
2.0000 g | Freq: Three times a day (TID) | INTRAVENOUS | Status: AC
  Administered 2023-12-13: 2 g via INTRAVENOUS
  Filled 2023-12-12: qty 100

## 2023-12-12 MED ORDER — LIDOCAINE 2% (20 MG/ML) 5 ML SYRINGE
INTRAMUSCULAR | Status: DC | PRN
  Administered 2023-12-12: 40 mg via INTRAVENOUS

## 2023-12-12 MED ORDER — ACETAMINOPHEN 10 MG/ML IV SOLN
INTRAVENOUS | Status: DC | PRN
Start: 2023-12-12 — End: 2023-12-12
  Administered 2023-12-12: 1000 mg via INTRAVENOUS

## 2023-12-12 MED ORDER — AMLODIPINE BESYLATE 10 MG PO TABS
10.0000 mg | ORAL_TABLET | Freq: Every day | ORAL | Status: DC
  Administered 2023-12-12 – 2023-12-14 (×3): 10 mg via ORAL
  Filled 2023-12-12 (×3): qty 1

## 2023-12-12 MED ORDER — AMISULPRIDE (ANTIEMETIC) 5 MG/2ML IV SOLN: 10.0000 mg | Freq: Once | INTRAVENOUS | Status: DC | PRN

## 2023-12-12 MED ORDER — THROMBIN 5000 UNITS EX SOLR
OROMUCOSAL | Status: DC | PRN
  Administered 2023-12-12: 5 mL via TOPICAL

## 2023-12-12 MED ORDER — LATANOPROST 0.005 % OP SOLN
1.0000 [drp] | Freq: Every day | OPHTHALMIC | Status: DC
  Administered 2023-12-12 – 2023-12-13 (×2): 1 [drp] via OPHTHALMIC
  Filled 2023-12-12: qty 2.5

## 2023-12-12 MED ORDER — POLYETHYLENE GLYCOL 3350 17 G PO PACK: 17.0000 g | PACK | Freq: Every day | ORAL | Status: DC | PRN

## 2023-12-12 MED ORDER — CHLORHEXIDINE GLUCONATE 0.12 % MT SOLN
OROMUCOSAL | Status: AC
  Administered 2023-12-12: 15 mL via OROMUCOSAL
  Filled 2023-12-12: qty 15

## 2023-12-12 MED ORDER — SODIUM CHLORIDE 0.9 % IV SOLN: 250.0000 mL | INTRAVENOUS | Status: AC

## 2023-12-12 MED ORDER — ACETAMINOPHEN 650 MG RE SUPP
650.0000 mg | RECTAL | Status: DC | PRN
  Filled 2023-12-12: qty 1

## 2023-12-12 MED ORDER — BUPIVACAINE HCL (PF) 0.5 % IJ SOLN
INTRAMUSCULAR | Status: AC
  Filled 2023-12-12: qty 30

## 2023-12-12 MED ORDER — FLEET ENEMA RE ENEM: 1.0000 | ENEMA | Freq: Once | RECTAL | Status: DC | PRN

## 2023-12-12 MED ORDER — CEFAZOLIN SODIUM-DEXTROSE 2-4 GM/100ML-% IV SOLN
2.0000 g | INTRAVENOUS | Status: AC
  Administered 2023-12-12 (×2): 2 g via INTRAVENOUS

## 2023-12-12 MED ORDER — ORAL CARE MOUTH RINSE: 15.0000 mL | Freq: Once | OROMUCOSAL | Status: AC

## 2023-12-12 MED ORDER — CHLORHEXIDINE GLUCONATE CLOTH 2 % EX PADS
6.0000 | MEDICATED_PAD | Freq: Once | CUTANEOUS | Status: AC
  Administered 2023-12-12: 6 via TOPICAL

## 2023-12-12 MED ORDER — ACETAMINOPHEN 10 MG/ML IV SOLN
INTRAVENOUS | Status: AC
  Filled 2023-12-12: qty 100

## 2023-12-12 MED ORDER — METHOCARBAMOL 1000 MG/10ML IJ SOLN: 500.0000 mg | Freq: Four times a day (QID) | INTRAMUSCULAR | Status: DC | PRN

## 2023-12-12 MED ORDER — CHLORHEXIDINE GLUCONATE 0.12 % MT SOLN: 15.0000 mL | Freq: Once | OROMUCOSAL | Status: AC

## 2023-12-12 MED ORDER — LACTATED RINGERS IV SOLN: INTRAVENOUS | Status: DC

## 2023-12-12 MED ORDER — ONDANSETRON HCL 4 MG/2ML IJ SOLN
INTRAMUSCULAR | Status: AC
Start: 2023-12-12 — End: 2023-12-12
  Filled 2023-12-12: qty 2

## 2023-12-12 MED ORDER — 0.9 % SODIUM CHLORIDE (POUR BTL) OPTIME
TOPICAL | Status: DC | PRN
  Administered 2023-12-12: 1000 mL

## 2023-12-12 MED ORDER — PHENYLEPHRINE 80 MCG/ML (10ML) SYRINGE FOR IV PUSH (FOR BLOOD PRESSURE SUPPORT)
PREFILLED_SYRINGE | INTRAVENOUS | Status: DC | PRN
  Administered 2023-12-12: 240 ug via INTRAVENOUS
  Administered 2023-12-12: 80 ug via INTRAVENOUS
  Administered 2023-12-12 (×3): 160 ug via INTRAVENOUS

## 2023-12-12 MED ORDER — SODIUM CHLORIDE 0.9% FLUSH
3.0000 mL | Freq: Two times a day (BID) | INTRAVENOUS | Status: DC
  Administered 2023-12-12: 3 mL via INTRAVENOUS

## 2023-12-12 MED ORDER — BUPIVACAINE HCL (PF) 0.5 % IJ SOLN
INTRAMUSCULAR | Status: DC | PRN
  Administered 2023-12-12: 5 mL

## 2023-12-12 MED ORDER — SUGAMMADEX SODIUM 200 MG/2ML IV SOLN
INTRAVENOUS | Status: DC | PRN
  Administered 2023-12-12 (×2): 100 mg via INTRAVENOUS

## 2023-12-12 MED ORDER — SULFAMETHOXAZOLE-TRIMETHOPRIM 800-160 MG PO TABS: 1.0000 | ORAL_TABLET | Freq: Two times a day (BID) | ORAL | Status: DC

## 2023-12-12 MED ORDER — FENTANYL CITRATE (PF) 100 MCG/2ML IJ SOLN
25.0000 ug | INTRAMUSCULAR | Status: DC | PRN
  Administered 2023-12-12 (×3): 50 ug via INTRAVENOUS

## 2023-12-12 MED ORDER — PHENOL 1.4 % MT LIQD: 1.0000 | OROMUCOSAL | Status: DC | PRN

## 2023-12-12 MED ORDER — FENTANYL CITRATE (PF) 250 MCG/5ML IJ SOLN
INTRAMUSCULAR | Status: AC
  Filled 2023-12-12: qty 5

## 2023-12-12 MED ORDER — ALUM & MAG HYDROXIDE-SIMETH 200-200-20 MG/5ML PO SUSP: 30.0000 mL | Freq: Four times a day (QID) | ORAL | Status: DC | PRN

## 2023-12-12 MED ORDER — LIDOCAINE 2% (20 MG/ML) 5 ML SYRINGE
INTRAMUSCULAR | Status: AC
  Filled 2023-12-12: qty 5

## 2023-12-12 MED ORDER — FLUTICASONE PROPIONATE 50 MCG/ACT NA SUSP
2.0000 | Freq: Every day | NASAL | Status: DC | PRN
Start: 2023-12-12 — End: 2023-12-14

## 2023-12-12 MED ORDER — PROPOFOL 10 MG/ML IV BOLUS
INTRAVENOUS | Status: AC
  Filled 2023-12-12: qty 20

## 2023-12-12 MED ORDER — OXYCODONE-ACETAMINOPHEN 5-325 MG PO TABS
1.0000 | ORAL_TABLET | Freq: Four times a day (QID) | ORAL | Status: DC | PRN
  Administered 2023-12-13: 1 via ORAL
  Administered 2023-12-13: 2 via ORAL
  Administered 2023-12-13: 1 via ORAL
  Administered 2023-12-13 – 2023-12-14 (×4): 2 via ORAL
  Filled 2023-12-12: qty 1
  Filled 2023-12-12 (×3): qty 2
  Filled 2023-12-12: qty 1
  Filled 2023-12-12 (×2): qty 2

## 2023-12-12 MED ORDER — ONDANSETRON HCL 4 MG/2ML IJ SOLN
INTRAMUSCULAR | Status: DC | PRN
  Administered 2023-12-12: 4 mg via INTRAVENOUS

## 2023-12-12 SURGICAL SUPPLY — 57 items
BAG COUNTER SPONGE SURGICOUNT (BAG) ×1 IMPLANT
BASKET BONE COLLECTION (BASKET) ×1 IMPLANT
BLADE BONE MILL MEDIUM (MISCELLANEOUS) ×1 IMPLANT
BLADE CLIPPER SURG (BLADE) IMPLANT
BUR MATCHSTICK NEURO 3.0 LAGG (BURR) ×1 IMPLANT
CAGE COROENT LG 12X9X23-12 (Cage) IMPLANT
CAGE PLIF 8X9X23-12 LUMBAR (Cage) IMPLANT
CANISTER SUCTION 3000ML PPV (SUCTIONS) ×1 IMPLANT
CNTNR URN SCR LID CUP LEK RST (MISCELLANEOUS) ×1 IMPLANT
COVER BACK TABLE 60X90IN (DRAPES) ×1 IMPLANT
DERMABOND ADVANCED .7 DNX12 (GAUZE/BANDAGES/DRESSINGS) ×1 IMPLANT
DEVICE DISSECT PLASMABLAD 3.0S (MISCELLANEOUS) ×1 IMPLANT
DRAPE C-ARM 42X72 X-RAY (DRAPES) ×2 IMPLANT
DRAPE HALF SHEET 40X57 (DRAPES) IMPLANT
DRAPE LAPAROTOMY 100X72X124 (DRAPES) ×1 IMPLANT
DRSG OPSITE POSTOP 4X10 (GAUZE/BANDAGES/DRESSINGS) IMPLANT
DRSG OPSITE POSTOP 4X6 (GAUZE/BANDAGES/DRESSINGS) IMPLANT
DURAPREP 26ML APPLICATOR (WOUND CARE) ×1 IMPLANT
DURASEAL APPLICATOR TIP (TIP) IMPLANT
DURASEAL SPINE SEALANT 3ML (MISCELLANEOUS) IMPLANT
ELECTRODE REM PT RTRN 9FT ADLT (ELECTROSURGICAL) ×1 IMPLANT
GAUZE 4X4 16PLY ~~LOC~~+RFID DBL (SPONGE) IMPLANT
GAUZE SPONGE 4X4 12PLY STRL (GAUZE/BANDAGES/DRESSINGS) ×1 IMPLANT
GLOVE BIOGEL PI IND STRL 8.5 (GLOVE) ×2 IMPLANT
GLOVE ECLIPSE 8.5 STRL (GLOVE) ×2 IMPLANT
GOWN STRL REUS W/ TWL LRG LVL3 (GOWN DISPOSABLE) IMPLANT
GOWN STRL REUS W/ TWL XL LVL3 (GOWN DISPOSABLE) IMPLANT
GOWN STRL REUS W/TWL 2XL LVL3 (GOWN DISPOSABLE) ×2 IMPLANT
GRAFT BONE PROTEIOS LRG 5CC (Orthopedic Implant) IMPLANT
HEMOSTAT POWDER KIT SURGIFOAM (HEMOSTASIS) ×1 IMPLANT
KIT BASIN OR (CUSTOM PROCEDURE TRAY) ×1 IMPLANT
KIT TURNOVER KIT B (KITS) ×1 IMPLANT
MILL BONE PREP (MISCELLANEOUS) ×1 IMPLANT
NDL HYPO 22X1.5 SAFETY MO (MISCELLANEOUS) ×1 IMPLANT
NDL SPNL 18GX3.5 QUINCKE PK (NEEDLE) IMPLANT
NEEDLE HYPO 22X1.5 SAFETY MO (MISCELLANEOUS) ×1 IMPLANT
NEEDLE SPNL 18GX3.5 QUINCKE PK (NEEDLE) IMPLANT
PACK LAMINECTOMY NEURO (CUSTOM PROCEDURE TRAY) ×1 IMPLANT
PAD ARMBOARD POSITIONER FOAM (MISCELLANEOUS) ×3 IMPLANT
PATTIES SURGICAL .5 X1 (DISPOSABLE) ×1 IMPLANT
ROD RELINE LOROTIC TI 5.5X55MM (Rod) IMPLANT
SCREW LOCK RELINE 5.5 TULIP (Screw) IMPLANT
SCREW RELINE 2FS POLY 6.5X45 (Screw) IMPLANT
SOLN 0.9% NACL POUR BTL 1000ML (IV SOLUTION) ×1 IMPLANT
SOLN STERILE WATER BTL 1000 ML (IV SOLUTION) ×1 IMPLANT
SPIKE FLUID TRANSFER (MISCELLANEOUS) ×1 IMPLANT
SPONGE T-LAP 4X18 ~~LOC~~+RFID (SPONGE) IMPLANT
SUT PROLENE 6 0 BV (SUTURE) IMPLANT
SUT VIC AB 1 CT1 18XBRD ANBCTR (SUTURE) ×1 IMPLANT
SUT VIC AB 2-0 CP2 18 (SUTURE) ×1 IMPLANT
SUT VIC AB 3-0 SH 8-18 (SUTURE) ×1 IMPLANT
SUT VIC AB 4-0 RB1 18 (SUTURE) ×1 IMPLANT
SYR 3ML LL SCALE MARK (SYRINGE) ×4 IMPLANT
SYR 5ML LL (SYRINGE) IMPLANT
TOWEL GREEN STERILE (TOWEL DISPOSABLE) ×1 IMPLANT
TOWEL GREEN STERILE FF (TOWEL DISPOSABLE) ×1 IMPLANT
TRAY FOLEY MTR SLVR 16FR STAT (SET/KITS/TRAYS/PACK) ×1 IMPLANT

## 2023-12-12 NOTE — Op Note (Signed)
 Date of surgery: 12/12/2023 Preoperative diagnosis spondylolisthesis L3-4 L4-5 with stenosis neurogenic claudication lumbar radiculopathy. Postoperative diagnosis: Same Procedure: Lumbar laminectomy L3-4 and L4-5 with more worked and required for simple interbody technique.  Posterior lumbar interbody arthrodesis with peek spacers local autograft  and Proteus, L3-4 and L4-5 pedicle screw fixation L3-L4-L5 with fluoroscopic guidance posterolateral arthrodesis with local autograft and Proteus. Surgeon: Victory Gens Anesthesia: General Endotracheal Indications: Jeffrey Rhodes is an 83 year old individual whose had significant back and bilateral leg pain with substantial weakness that came up on him rather quickly he has evidence of a spondylolisthesis at L4-L5 with an additional herniated nucleus pulposus in this region in addition there is severe stenosis at L3-L4.  He is advised regarding surgery to decompress L3-4 and L4-5 and stabilize it secondary to the spondylolisthesis.  Procedure: The patient was brought to the operating room supine on the stretcher.  After the smooth induction of general tracheal anesthesia, he was carefully turned prone.  The back was prepped with alcohol DuraPrep and draped in a sterile fashion.  Midline incision was created and carried down to the lumbodorsal fascia which was opened on the right side initially and the interlaminar space was marked at L3-4.  Then by further dissection we took a second x-ray to identify L3-4 and L4-5 positively.  Self-retaining retractor was placed in the wound the facets were noted to be quite steep and the patient had a profound lordosis in the lower lumbar spine.  Then laminectomies were created removing the inferior margin lamina of L4 and L3 out to and including the entirety of the facet ultimately the entire laminar arch of L4 was removed.  This was saved for autograft.  The facets were then carefully decorticated at each level and care was taken  to take up the yellow ligament which was thickened and redundant there is noted to be a very large disc herniation on the right side at L4-L5 and this extended over to the left side creating severe stenosis and compression.  L3-4 was decompressed in a similar fashion here the path of the common dural tube was very tight against the posterior elements.  Once the ligament was released and the dura could be mobilized the nerve roots could be mobilized and the spaces were isolated L3-4 and L4-5.  Then a complete discectomy was performed at L3-4 and L4-5 using a combination of curettes and rongeurs and working from side-to-side to free up the disc and also to mobilize the interspace.  Ultimately it was felt that a 8 x 9 x 23 mm spacer with 12 degrees of lordosis would fit into L3-4 to these were prepared at L for L5 a 12 x 9 x 23 mm spacer with 12 degrees lordosis was prepared filled with bone graft a total of 16 cc of bone graft was packed into the L4-5 space total of 9 cc of bone graft was packed into the L3-4 space.  Lateral gutters were decorticated and bone was packed into the lateral gutters between L3-L4 and L5.  6.5 x 45 mm pedicle screws were then placed fluoroscopically at L3-L4 and L5.'s were checked individually.  Then the pedicle screws were connected together with a 55 mm precontoured rods on each side.  The radiographs were obtained with AP and lateral projections identifying good position of the hardware.  There was good reduction of the spondylolisthesis and there was ample decompression of the L3-L4 and L5 nerve roots individually.  With this hemostasis was well-established 25 cc of  half percent Marcaine was injected into the paraspinous fascia and then the lumbodorsal fascia was closed with #1 Vicryl in interrupted fashion 2-0 Vicryl was used in the subcutaneous tissues 3-0 and 4-0 Vicryl in the subcuticular skin.  Dermabond was placed on the skin.  Blood loss was estimated 200 cc.

## 2023-12-12 NOTE — Transfer of Care (Signed)
 Immediate Anesthesia Transfer of Care Note  Patient: Jeffrey Rhodes  Procedure(s) Performed: LUMBAR THREE-FOUR, LUMBAR FOUR-FIVE POSTERIOR LUMBAR INTERBODY FUSION (Back)  Patient Location: PACU  Anesthesia Type:General  Level of Consciousness: awake, alert , and oriented  Airway & Oxygen Therapy: Patient Spontanous Breathing  Post-op Assessment: Report given to RN and Post -op Vital signs reviewed and stable  Post vital signs: Reviewed and stable  Last Vitals:  Vitals Value Taken Time  BP 128/93 12/12/23 19:15  Temp    Pulse 75 12/12/23 19:16  Resp 15 12/12/23 19:16  SpO2 98 % 12/12/23 19:16  Vitals shown include unfiled device data.  Last Pain:  Vitals:   12/12/23 1052  TempSrc:   PainSc: 6       Patients Stated Pain Goal: 1 (12/12/23 1052)  Complications: No notable events documented.

## 2023-12-12 NOTE — H&P (Signed)
 Jeffrey Rhodes is an 83 y.o. male.   Chief Complaint: Back pain bilateral leg weakness HPI: Patient is an 83 year old individual who has in the past couple of months developed progressively worsening back pain with significant leg weakness.  He notes that he has a significant difficulty with maintaining his balance and up until a few months ago was walking unimpeded.  On testing and note that he has significant gastroc weakness and MRI findings are consistent with high-grade stenosis at L3-4 and L4-5.  He has a degenerative spondylolisthesis at L4-L5.  He has failed all manner of conservative management including epidural steroid injections and was advised regarding the need for surgical decompression and stabilization.  Past Medical History:  Diagnosis Date   Allergy    Arthritis    Cataract    Coronary artery disease    GERD (gastroesophageal reflux disease)    Glaucoma    Heart murmur    High cholesterol    Hypertension    Hypothyroid    Myocardial infarction [facility])     Past Surgical History:  Procedure Laterality Date   CORONARY ARTERY BYPASS GRAFT  2014   [place]   DENTAL RESTORATION/EXTRACTION WITH X-RAY      Family History  Problem Relation Age of Onset   Breast cancer Mother    Diabetes Brother    Heart disease Brother    Colon cancer Neg Hx    Esophageal cancer Neg Hx    Rectal cancer Neg Hx    Stomach cancer Neg Hx    Social History:  reports that he has quit smoking. His smoking use included cigarettes. He has never used smokeless tobacco. He reports current alcohol use. He reports that he does not use drugs.  Allergies:  Allergies  Allergen Reactions   Bee Venom Other (See Comments)    unknown unknown    Lisinopril Cough   Losartan Cough    No medications prior to admission.    No results found for this or any previous visit (from the past 48 hours). No results found.  Review of Systems  Constitutional:  Positive for activity change.   Musculoskeletal:  Positive for back pain and gait problem.  Neurological:  Positive for weakness and numbness.  All other systems reviewed and are negative.   There were no vitals taken for this visit. Physical Exam Constitutional:      Appearance: Normal appearance. He is normal weight.  HENT:     Head: Normocephalic and atraumatic.     Right Ear: Tympanic membrane, ear canal and external ear normal.     Left Ear: Tympanic membrane, ear canal and external ear normal.     Nose: Nose normal.     Mouth/Throat:     Mouth: Mucous membranes are moist.     Pharynx: Oropharynx is clear.  Eyes:     Extraocular Movements: Extraocular movements intact.     Conjunctiva/sclera: Conjunctivae normal.     Pupils: Pupils are equal, round, and reactive to light.  Cardiovascular:     Rate and Rhythm: Normal rate and regular rhythm.     Pulses: Normal pulses.     Heart sounds: Normal heart sounds.  Pulmonary:     Effort: Pulmonary effort is normal.     Breath sounds: Normal breath sounds.  Abdominal:     General: Abdomen is flat. Bowel sounds are normal.     Palpations: Abdomen is soft.  Musculoskeletal:     Cervical back: Normal range of motion  and neck supple.  Skin:    General: Skin is warm and dry.     Capillary Refill: Capillary refill takes less than 2 seconds.  Neurological:     Mental Status: He is alert and oriented to person, place, and time.     Comments: Marked weakness in the right gastroc at 3 out of 5 left gastroc is 4 out of 5 tibialis anterior is also weak and at 4 out of 5 bilaterally.  Absent reflexes in the patellae and the Achilles proximal leg strength is decreased in the quadriceps bilaterally at 3 out of 5.  Sensation is diminished in the distal lower extremities in the lateral aspects of the legs.  Dorsum of the feet has decreased pin and light touch sensation.  Otherwise the patient has good cranial nerve function and good upper extremity function.  Psychiatric:         Mood and Affect: Mood normal.        Thought Content: Thought content normal.        Judgment: Judgment normal.      Assessment/Plan Spondylolisthesis and stenosis L3-4 L4-5 with neurogenic claudication, lumbar radiculopathy.  Plan: Posterior decompression arthrodesis L3-4 and L4-5.  [name], MD 12/12/2023, 8:05 AM

## 2023-12-12 NOTE — Anesthesia Procedure Notes (Addendum)
 Procedure Name: Intubation Date/Time: 12/12/2023 2:30 PM  Performed by: Hedy Jarred, CRNAPre-anesthesia Checklist: Patient identified, Emergency Drugs available, Suction available and Patient being monitored Patient Re-evaluated:Patient Re-evaluated prior to induction Oxygen Delivery Method: Circle System Utilized Preoxygenation: Pre-oxygenation with 100% oxygen Induction Type: IV induction Ventilation: Mask ventilation without difficulty Laryngoscope Size: Miller and 2 Grade View: Grade I Tube type: Oral Tube size: 8.0 mm Number of attempts: 1 Airway Equipment and Method: Stylet and Oral airway Placement Confirmation: ETT inserted through vocal cords under direct vision, positive ETCO2 and breath sounds checked- equal and bilateral Secured at: 23 cm Tube secured with: Tape Dental Injury: Teeth and Oropharynx as per pre-operative assessment

## 2023-12-12 NOTE — Interval H&P Note (Signed)
 History and Physical Interval Note:  12/12/2023 2:26 PM  Jeffrey Rhodes  has presented today for surgery, with the diagnosis of Spondylolisthesis of lumber region.  The various methods of treatment have been discussed with the patient and family. After consideration of risks, benefits and other options for treatment, the patient has consented to  Procedure(s) with comments: POSTERIOR LUMBAR FUSION 2 LEVEL (N/A) - PLIF L3-4, L4-5 as a surgical intervention.  The patient's history has been reviewed, patient examined, no change in status, stable for surgery.  I have reviewed the patient's chart and labs.  Questions were answered to the patient's satisfaction.     [name]

## 2023-12-13 DIAGNOSIS — M4316 Spondylolisthesis, lumbar region: Secondary | ICD-10-CM | POA: Diagnosis not present

## 2023-12-13 LAB — CBC
HCT: 32.3 % — ABNORMAL LOW (ref 39.0–52.0)
Hemoglobin: 11.5 g/dL — ABNORMAL LOW (ref 13.0–17.0)
MCH: 32.5 pg (ref 26.0–34.0)
MCHC: 35.6 g/dL (ref 30.0–36.0)
MCV: 91.2 fL (ref 80.0–100.0)
Platelets: 231 K/uL (ref 150–400)
RBC: 3.54 MIL/uL — ABNORMAL LOW (ref 4.22–5.81)
RDW: 13.1 % (ref 11.5–15.5)
WBC: 18.3 K/uL — ABNORMAL HIGH (ref 4.0–10.5)
nRBC: 0 % (ref 0.0–0.2)

## 2023-12-13 LAB — BASIC METABOLIC PANEL WITH GFR
Anion gap: 10 (ref 5–15)
BUN: 28 mg/dL — ABNORMAL HIGH (ref 8–23)
CO2: 21 mmol/L — ABNORMAL LOW (ref 22–32)
Calcium: 8.5 mg/dL — ABNORMAL LOW (ref 8.9–10.3)
Chloride: 105 mmol/L (ref 98–111)
Creatinine, Ser: 1.03 mg/dL (ref 0.61–1.24)
GFR, Estimated: >60 mL/min (ref >60)
Glucose, Bld: 162 mg/dL — ABNORMAL HIGH (ref 70–99)
Potassium: 4.2 mmol/L (ref 3.5–5.1)
Sodium: 136 mmol/L (ref 135–145)

## 2023-12-13 MED ORDER — DEXAMETHASONE 4 MG PO TABS
2.0000 mg | ORAL_TABLET | Freq: Two times a day (BID) | ORAL | Status: DC
  Administered 2023-12-13 – 2023-12-14 (×3): 2 mg via ORAL
  Filled 2023-12-13 (×3): qty 1

## 2023-12-13 NOTE — Progress Notes (Signed)
 Orthopedic Tech Progress Note Patient Details:  QUADIR MUNS 1940/12/28  [id number]  Left LSO at bedside per RN instructions. Advised RN to call if the patient would like us  to apply LSO and we would return   Ortho Devices Type of Ortho Device: Lumbar corsett Ortho Device/Splint Interventions: Alyse Bernarda JAYSON Tere 12/13/2023, 12:56 AM

## 2023-12-13 NOTE — Evaluation (Signed)
 Occupational Therapy Evaluation Patient Details Name: Jeffrey Rhodes MRN: [id number] DOB: June 06, 1940 Today's Date: 12/13/2023   History of Present Illness   Jeffrey Rhodes is a 83 yo male who is s/p Lumbar laminectomy L3-4 and L4-5, L3-4 and L4-5 pedicle screw fixation L3-L4-L5 10/20. PMHx: allergy, arthritis, cataract, CAD, GERD, glaucoma, high cholesterol, HTN, hypothyroid, MI     Clinical Impressions [name] was evaluated s/p the above admission list. He is mod I with RW at baseline, however he states he has been very limited by pain for the last few weeks and has gotten weak. Upon evaluation the pt was limited by surgical pain, weakness, spinal precautions, compensatory techniques, insight and decreased activity tolerance. Overall he needed min A for bed mobility and to stand from the EOB. He mobilized with RW given CGA for safety. Due to the deficits listed below the pt also needs up to min A for LB ADLs and set up A for UB ADLs in sitting. Pt will benefit from continued acute OT services and HHOT.      If plan is discharge home, recommend the following:   A little help with walking and/or transfers;A little help with bathing/dressing/bathroom;Assistance with cooking/housework;Assist for transportation     Functional Status Assessment   Patient has had a recent decline in their functional status and demonstrates the ability to make significant improvements in function in a reasonable and predictable amount of time.     Equipment Recommendations   BSC/3in1      Precautions/Restrictions   Precautions Precautions: Fall Recall of Precautions/Restrictions: Intact Required Braces or Orthoses: Spinal Brace Spinal Brace: Lumbar corset;Applied in sitting position Restrictions Weight Bearing Restrictions Per Provider Order: No     Mobility Bed Mobility Overal bed mobility: Needs Assistance Bed Mobility: Rolling, Sidelying to Sit Rolling: Min assist Sidelying to sit: Min  assist       General bed mobility comments: from a clat surface, use of rail    Transfers Overall transfer level: Needs assistance Equipment used: Rolling walker (2 wheels) Transfers: Sit to/from Stand Sit to Stand: Min assist                  Balance Overall balance assessment: Needs assistance Sitting-balance support: Feet supported Sitting balance-Leahy Scale: Good     Standing balance support: No upper extremity supported, During functional activity Standing balance-Leahy Scale: Fair Standing balance comment: statically at the sink                           ADL either performed or assessed with clinical judgement   ADL Overall ADL's : Needs assistance/impaired Eating/Feeding: Independent   Grooming: Supervision/safety;Standing   Upper Body Bathing: Set up;Sitting   Lower Body Bathing: Minimal assistance;Sit to/from stand   Upper Body Dressing : Set up;Standing Upper Body Dressing Details (indicate cue type and reason): total A for brace Lower Body Dressing: Moderate assistance;Sit to/from stand   Toilet Transfer: Minimal assistance;Ambulation;Rolling walker (2 wheels)   Toileting- Clothing Manipulation and Hygiene: Modified independent       Functional mobility during ADLs: Minimal assistance;Rolling walker (2 wheels) General ADL Comments: min A for STS, cues for safety, pt is not able to get into figure four position     Vision Baseline Vision/History: 1 Wears glasses Vision Assessment?: No apparent visual deficits     Perception Perception: Within Functional Limits       Praxis Praxis: [facility]       Pertinent  Vitals/Pain Pain Assessment Pain Assessment: Faces Faces Pain Scale: Hurts even more Pain Location: back Pain Descriptors / Indicators: Discomfort Pain Intervention(s): Limited activity within patient's tolerance, Monitored during session     Extremity/Trunk Assessment Upper Extremity Assessment Upper Extremity  Assessment: Generalized weakness   Lower Extremity Assessment Lower Extremity Assessment: Defer to PT evaluation   Cervical / Trunk Assessment Cervical / Trunk Assessment: Back Surgery   Communication Communication Communication: No apparent difficulties   Cognition Arousal: Alert Behavior During Therapy: WFL for tasks assessed/performed Cognition: No apparent impairments                               Following commands: Intact       Cueing  General Comments   Cueing Techniques: Verbal cues  VSS on RA   Exercises     Shoulder Instructions      Home Living Family/patient expects to be discharged to:: Private residence Living Arrangements: Children Available Help at Discharge: Family;Available 24 hours/day Type of Home: House Home Access: Stairs to enter [employer] of Steps: 3   Home Layout: One level         Firefighter: Standard     Home Equipment: Agricultural consultant (2 wheels)          Prior Functioning/Environment Prior Level of Function : Independent/Modified Independent             Mobility Comments: RW for mobility, he was very limited by pain for the last ~month ADLs Comments: he was managing mod I, he had to stop working and driving due to back pain    OT Problem List: Decreased strength;Decreased activity tolerance;Decreased range of motion;Decreased safety awareness;Decreased knowledge of precautions   OT Treatment/Interventions: Self-care/ADL training;Therapeutic exercise;DME and/or AE instruction;Therapeutic activities;Patient/family education;Balance training      OT Goals(Current goals can be found in the care plan section)   Acute Rehab OT Goals Patient Stated Goal: less pain OT Goal Formulation: With patient Time For Goal Achievement: 12/27/23 Potential to Achieve Goals: Good ADL Goals Pt Will Perform Lower Body Bathing: with modified independence;sit to/from stand Pt Will Perform Lower Body  Dressing: with modified independence;sit to/from stand Pt Will Transfer to Toilet: with modified independence Additional ADL Goal #1: Pt will indep follow spinal precautions 100% of the time during ADLs   OT Frequency:  Min 2X/week    Co-evaluation              AM-PAC OT 6 Clicks Daily Activity     Outcome Measure Help from another person eating meals?: None Help from another person taking care of personal grooming?: A Little Help from another person toileting, which includes using toliet, bedpan, or urinal?: A Little Help from another person bathing (including washing, rinsing, drying)?: A Little Help from another person to put on and taking off regular upper body clothing?: A Little Help from another person to put on and taking off regular lower body clothing?: A Lot 6 Click Score: 18   End of Session Equipment Utilized During Treatment: Gait belt;Rolling walker (2 wheels);Back brace Nurse Communication: Mobility status  Activity Tolerance: Patient tolerated treatment well Patient left: in chair;with call bell/phone within reach  OT Visit Diagnosis: Unsteadiness on feet (R26.81);Other abnormalities of gait and mobility (R26.89)                Time: 9174-9157 OT Time Calculation (min): 17 min Charges:  OT General Charges $OT Visit: 1  Visit OT Evaluation $OT Eval Moderate Complexity: 1 Mod  Lucie Kendall, OTR/L Acute Rehabilitation Services Office [phone] Secure Chat Communication Preferred   Lucie JONETTA Kendall 12/13/2023, 10:01 AM

## 2023-12-13 NOTE — TOC Transition Note (Signed)
 Transition of Care [facility]) - Discharge Note   Patient Details  Name: Jeffrey Rhodes MRN: [id number] Date of Birth: 22-Aug-1940  Transition of Care [facility]) CM/SW Contact:  Andrez JULIANNA George, RN Phone Number: 12/13/2023, 1:24 PM   Clinical Narrative:     Pt will discharge home with home health services through [facility]. Information on the AVS. [name] will contact the patient for the first home visit.  Pt has transportation  home.  Final next level of care: Home w Home Health Services Barriers to Discharge: No Barriers Identified   Patient Goals and CMS Choice   CMS Medicare.gov Compare Post Acute Care list provided to:: Patient Represenative (must comment) Choice offered to / list presented to : Patient, Spouse      Discharge Placement                       Discharge Plan and Services Additional resources added to the After Visit Summary for                            [facility] Arranged: PT, OT, Nurse's Aide [facility] Agency: [facility] Date [facility] Agency Contacted: 12/13/23   Representative spoke with at [facility] Agency: Darleene  Social Drivers of Health (SDOH) Interventions SDOH Screenings   Food Insecurity: No Food Insecurity (10/27/2023)  Housing: Unknown (10/27/2023)  Transportation Needs: Unknown (10/27/2023)  Utilities: Not At Risk (12/28/2022)  Alcohol Screen: Low Risk  (10/27/2023)  Depression (PHQ2-9): Low Risk  (09/28/2023)  Financial Resource Strain: Low Risk  (10/27/2023)  Physical Activity: Insufficiently Active (10/27/2023)  Social Connections: Moderately Isolated (10/27/2023)  Stress: No Stress Concern Present (10/27/2023)  Tobacco Use: Medium Risk (12/08/2023)  Health Literacy: Adequate Health Literacy (12/28/2022)     Readmission Risk Interventions     No data to display

## 2023-12-13 NOTE — Progress Notes (Signed)
 Subjective: Patient reports offers no complaints  Objective: Vital signs in last 24 hours: Temp:  [98.2 F (36.8 C)-98.5 F (36.9 C)] 98.4 F (36.9 C) (10/21 0754) Pulse Rate:  [54-94] 94 (10/21 0754) Resp:  [11-18] 17 (10/21 0754) BP: (109-138)/(57-93) 109/78 (10/21 0754) SpO2:  [93 %-99 %] 97 % (10/21 0754)  Intake/Output from previous day: 10/20 0701 - 10/21 0700 In: 2380 [P.O.:480; I.V.:1400; IV Piggyback:500] Out: 790 [Urine:590; Blood:200] Intake/Output this shift: Total I/O In: 240 [P.O.:240] Out: -   Motor function doing well, better than pre op with improved gastroc strength.  Lab Results: Recent Labs    12/13/23 0516  WBC 18.3*  HGB 11.5*  HCT 32.3*  PLT 231   BMET Recent Labs    12/13/23 0516  NA 136  K 4.2  CL 105  CO2 21*  GLUCOSE 162*  BUN 28*  CREATININE 1.03  CALCIUM  8.5*    Studies/Results: DG Lumbar Spine 2-3 Views Result Date: 12/12/2023 EXAM: 3 VIEW(S) XRAY OF THE LUMBAR SPINE 12/12/2023 06:41:00 PM COMPARISON: None available. CLINICAL HISTORY: [id number] Surgery, elective [id number]. Surgery: L3-5 PLIF; RSTO: BIW; Fluoro time: 17 seconds; mGy: 9.07. FINDINGS: LUMBAR SPINE: BONES: 3 intraoperative fluoroscopic images demonstrate L3-L5 PLIF with paired transpedicular screws and intervertebral spacers at L3-L4 and L4-L5. Fluoroscopy time reported as 17 seconds, 9.07 mGy. No acute fracture. No aggressive appearing osseous lesion. IMPRESSION: 1. L3-L5 posterior lumbar interbody fusion with paired pedicle screws and intervertebral spacers at both levels Electronically signed by: Franky Stanford MD 12/12/2023 07:36 PM EDT RP Workstation: [id number]   DG Lumbar Spine 2-3 Views Result Date: 12/12/2023 EXAM: 2 VIEW(S) XRAY OF THE LUMBAR SPINE 12/12/2023 03:30:00 PM COMPARISON: None available. CLINICAL HISTORY: [id number] Surgery, elective [id number]. Intra-op localizing images for L3-5 PLIF; RSTO: BIW. Intra-op localizing images for L3-5 PLIF; RSTO: BIW. FINDINGS:  LUMBAR SPINE: BONES: Intraoperative nerve hook at the level of the L3-L4 disc space and at the level of the L4 pedicle. Grade 1 anterolisthesis at L4-L5. No acute fracture. No aggressive appearing osseous lesion. DISCS AND DEGENERATIVE CHANGES: Grade 1 anterolisthesis at L4-L5. SOFT TISSUES: No acute abnormality. IMPRESSION: 1. Intraoperative localization with nerve hooks at the L3-L4 disc space and L4 pedicle. 2. Grade 1 anterolisthesis at L4-L5. Electronically signed by: Franky Stanford MD 12/12/2023 07:34 PM EDT RP Workstation: [id number]   DG C-Arm 1-60 Min-No Report Result Date: 12/12/2023 Fluoroscopy was utilized by the requesting physician.  No radiographic interpretation.   DG C-Arm 1-60 Min-No Report Result Date: 12/12/2023 Fluoroscopy was utilized by the requesting physician.  No radiographic interpretation.    Assessment/Plan: Stable post op day one, labs are good HGB 11.3. Plan discharge in am with home health.  LOS: 0 days  As above   Jeffrey Rhodes 12/13/2023, 4:02 PM

## 2023-12-13 NOTE — Evaluation (Signed)
 Physical Therapy Evaluation  Patient Details Name: Jeffrey Rhodes MRN: [id number] DOB: 1940/09/08 Today's Date: 12/13/2023  History of Present Illness  Jeffrey Rhodes is a 83 yo male who is s/p Lumbar laminectomy L3-4 and L4-5, L3-4 and L4-5 pedicle screw fixation L3-L4-L5 10/20. PMHx: allergy, arthritis, cataract, CAD, GERD, glaucoma, high cholesterol, HTN, hypothyroid, MI  Clinical Impression  Pt admitted with above diagnosis. At the time of PT eval, pt was able to demonstrate transfers and ambulation with gross CGA and RW for support. Pt was educated on precautions, brace application/wearing schedule, appropriate activity progression, and car transfer. Pt currently with functional limitations due to the deficits listed below (see PT Problem List). Pt will benefit from skilled PT to increase their independence and safety with mobility to allow discharge to the venue listed below.          If plan is discharge home, recommend the following: A little help with walking and/or transfers;A little help with bathing/dressing/bathroom;Assistance with cooking/housework;Assist for transportation;Help with stairs or ramp for entrance   Can travel by private vehicle        Equipment Recommendations BSC/3in1  Recommendations for Other Services       Functional Status Assessment Patient has had a recent decline in their functional status and demonstrates the ability to make significant improvements in function in a reasonable and predictable amount of time.     Precautions / Restrictions Precautions Precautions: Fall;Back Precaution Booklet Issued: Yes (comment) Recall of Precautions/Restrictions: Intact Precaution/Restrictions Comments: Reviewed handout and pt was cued for precautions during functional mobility. Required Braces or Orthoses: Spinal Brace Spinal Brace: Lumbar corset;Applied in sitting position Restrictions Weight Bearing Restrictions Per Provider Order: No      Mobility   Bed Mobility               General bed mobility comments: Pt received sitting up in the recliner    Transfers Overall transfer level: Needs assistance Equipment used: Rolling walker (2 wheels) Transfers: Sit to/from Stand Sit to Stand: Contact guard assist           General transfer comment: VC's for hand placement on seated surface for safety.    Ambulation/Gait Ambulation/Gait assistance: Contact guard assist, Supervision Gait Distance (Feet): 200 Feet Assistive device: Rolling walker (2 wheels) Gait Pattern/deviations: Step-through pattern, Trunk flexed, Decreased stride length Gait velocity: Decreased Gait velocity interpretation: <1.31 ft/sec, indicative of household ambulator   General Gait Details: VC's for improved posture, closer walker proximity and forward gaze. Hands on guarding provided for safety. No overt LOB noted.  Stairs Stairs: Yes Stairs assistance: Contact guard assist Stair Management: One rail Right Number of Stairs: 1 (x3) General stair comments: VC's for sequencing and general safety. Pt practiced 1 step 3 times.  Wheelchair Mobility     Tilt Bed    Modified Rankin (Stroke Patients Only)       Balance Overall balance assessment: Needs assistance Sitting-balance support: Feet supported Sitting balance-Leahy Scale: Good     Standing balance support: No upper extremity supported, During functional activity Standing balance-Leahy Scale: Fair Standing balance comment: statically at the sink                             Pertinent Vitals/Pain Pain Assessment Pain Assessment: Faces Faces Pain Scale: Hurts even more Pain Location: back Pain Descriptors / Indicators: Discomfort Pain Intervention(s): Limited activity within patient's tolerance, Monitored during session, Repositioned    Home Living  Family/patient expects to be discharged to:: Private residence Living Arrangements: Children Available Help at Discharge:  Family;Available 24 hours/day Type of Home: House Home Access: Stairs to enter   [employer] of Steps: 3   Home Layout: One level Home Equipment: Agricultural consultant (2 wheels)      Prior Function Prior Level of Function : Independent/Modified Independent             Mobility Comments: RW for mobility, he was very limited by pain for the last ~month ADLs Comments: he was managing mod I, he had to stop working and driving due to back pain     Extremity/Trunk Assessment   Upper Extremity Assessment Upper Extremity Assessment: Generalized weakness    Lower Extremity Assessment Lower Extremity Assessment: Generalized weakness    Cervical / Trunk Assessment Cervical / Trunk Assessment: Back Surgery  Communication   Communication Communication: No apparent difficulties    Cognition Arousal: Alert Behavior During Therapy: WFL for tasks assessed/performed   PT - Cognitive impairments: No apparent impairments                         Following commands: Intact       Cueing Cueing Techniques: Verbal cues     General Comments General comments (skin integrity, edema, etc.): VSS on RA    Exercises     Assessment/Plan    PT Assessment Patient needs continued PT services  PT Problem List Decreased strength;Decreased activity tolerance;Decreased balance;Decreased mobility;Decreased knowledge of use of DME;Pain;Decreased safety awareness       PT Treatment Interventions DME instruction;Gait training;Stair training;Functional mobility training;Therapeutic activities;Therapeutic exercise;Balance training;Patient/family education    PT Goals (Current goals can be found in the Care Plan section)  Acute Rehab PT Goals Patient Stated Goal: Return home at d/c PT Goal Formulation: With patient Time For Goal Achievement: 12/20/23 Potential to Achieve Goals: Good    Frequency Min 5X/week     Co-evaluation               AM-PAC PT 6 Clicks  Mobility  Outcome Measure Help needed turning from your back to your side while in a flat bed without using bedrails?: A Little Help needed moving from lying on your back to sitting on the side of a flat bed without using bedrails?: A Little Help needed moving to and from a bed to a chair (including a wheelchair)?: A Little Help needed standing up from a chair using your arms (e.g., wheelchair or bedside chair)?: A Little Help needed to walk in hospital room?: A Little Help needed climbing 3-5 steps with a railing? : A Little 6 Click Score: 18    End of Session Equipment Utilized During Treatment: Gait belt;Back brace Activity Tolerance: Patient tolerated treatment well Patient left: in bed;with call bell/phone within reach;with family/visitor present Nurse Communication: Mobility status PT Visit Diagnosis: Unsteadiness on feet (R26.81);Pain Pain - part of body:  (back)    Time: 9150-9079 PT Time Calculation (min) (ACUTE ONLY): 31 min   Charges:   PT Evaluation $PT Eval Low Complexity: 1 Low PT Treatments $Gait Training: 8-22 mins PT General Charges $$ ACUTE PT VISIT: 1 Visit         Jeffrey Rhodes, PT, DPT Acute Rehabilitation Services Secure Chat Preferred Office: [phone]   Jeffrey Rhodes 12/13/2023, 12:34 PM

## 2023-12-13 NOTE — Care Management Obs Status (Signed)
 MEDICARE OBSERVATION STATUS NOTIFICATION   Patient Details  Name: CALIPH BOROWIAK MRN: [id number] Date of Birth: [birth year]   Medicare Observation Status Notification Given:  Yes    Jon Cruel 12/13/2023, 10:57 AM

## 2023-12-14 DIAGNOSIS — M4316 Spondylolisthesis, lumbar region: Secondary | ICD-10-CM | POA: Diagnosis not present

## 2023-12-14 MED ORDER — OXYCODONE-ACETAMINOPHEN 5-325 MG PO TABS: 1.0000 | ORAL_TABLET | Freq: Four times a day (QID) | ORAL | 0 refills | Status: DC | PRN

## 2023-12-14 MED ORDER — METHOCARBAMOL 500 MG PO TABS: 500.0000 mg | ORAL_TABLET | Freq: Four times a day (QID) | ORAL | 3 refills | Status: AC | PRN

## 2023-12-14 MED ORDER — DEXAMETHASONE 1 MG PO TABS: ORAL_TABLET | ORAL | 0 refills | Status: DC

## 2023-12-14 MED FILL — Sodium Chloride IV Soln 0.9%: INTRAVENOUS | Qty: 2000 | Status: AC

## 2023-12-14 MED FILL — Heparin Sodium (Porcine) Inj 1000 Unit/ML: INTRAMUSCULAR | Qty: 30 | Status: AC

## 2023-12-14 NOTE — Progress Notes (Signed)
 Occupational Therapy Treatment Patient Details Name: Jeffrey Rhodes MRN: [id number] DOB: [birth year] Today's Date: 12/14/2023   History of present illness Jeffrey Rhodes is a 83 yo male who is s/p Lumbar laminectomy L3-4 and L4-5, L3-4 and L4-5 pedicle screw fixation L3-L4-L5 10/20. PMHx: allergy, arthritis, cataract, CAD, GERD, glaucoma, high cholesterol, HTN, hypothyroid, MI   OT comments  Patient demonstrating good gains with OT treatment. Patient continues to be min assist to get to EOB and was educated on bed rail for possible purchase to assist with bed mobility at home. Patient was able to ambulate to bathroom with CGA and stood at sink for grooming and bathing tasks with supervision. Dressing performed seated in recliner with education on AE use for LB dressing to maintain back precautions with min assist.  Discharge recommendations continue to be appropriate for home with HHOT to follow.       If plan is discharge home, recommend the following:  A little help with walking and/or transfers;A little help with bathing/dressing/bathroom;Assistance with cooking/housework;Assist for transportation   Equipment Recommendations  BSC/3in1    Recommendations for Other Services      Precautions / Restrictions Precautions Precautions: Fall;Back Precaution Booklet Issued: Yes (comment) Recall of Precautions/Restrictions: Intact Precaution/Restrictions Comments: reviewed precautions Required Braces or Orthoses: Spinal Brace Spinal Brace: Lumbar corset;Applied in sitting position Restrictions Weight Bearing Restrictions Per Provider Order: No       Mobility Bed Mobility Overal bed mobility: Needs Assistance Bed Mobility: Rolling, Sidelying to Sit Rolling: Contact guard assist, Used rails Sidelying to sit: Min assist, Used rails       General bed mobility comments: min assist to raise trunk, education on bed rail for home    Transfers Overall transfer level: Needs  assistance Equipment used: Rolling walker (2 wheels) Transfers: Sit to/from Stand Sit to Stand: Contact guard assist           General transfer comment: cues for hand placement     Balance Overall balance assessment: Needs assistance Sitting-balance support: Feet supported Sitting balance-Leahy Scale: Good     Standing balance support: No upper extremity supported, During functional activity Standing balance-Leahy Scale: Fair Standing balance comment: statically at the sink                           ADL either performed or assessed with clinical judgement   ADL Overall ADL's : Needs assistance/impaired     Grooming: Jeffrey/dry hands;Jeffrey/dry face;Oral care;Supervision/safety;Standing   Upper Body Bathing: Supervision/ safety;Standing   Lower Body Bathing: Minimal assistance;Sit to/from stand Lower Body Bathing Details (indicate cue type and reason): supervision standing at sink, assistance with feet Upper Body Dressing : Set up;Sitting Upper Body Dressing Details (indicate cue type and reason): mod assist with brace Lower Body Dressing: Minimal assistance;With adaptive equipment;Sit to/from stand Lower Body Dressing Details (indicate cue type and reason): education on reacher and sock aide use for LB dressing with min assist Toilet Transfer: Contact guard assist;Ambulation;Regular Toilet;Rolling walker (2 wheels)   Toileting- Clothing Manipulation and Hygiene: Modified independent       Functional mobility during ADLs: Contact guard assist;Rolling walker (2 wheels) General ADL Comments: education on AE use to maintain back precautions    Extremity/Trunk Assessment              Vision       Perception     Praxis     Communication Communication Communication: No apparent difficulties   Cognition Arousal:  Alert Behavior During Therapy: WFL for tasks assessed/performed Cognition: No apparent impairments                                Following commands: Intact        Cueing   Cueing Techniques: Verbal cues  Exercises      Shoulder Instructions       General Comments education on AE use for LB dressing and bed rail to assist with bed mobility at home    Pertinent Vitals/ Pain       Pain Assessment Pain Assessment: 0-10 Pain Score: 3  Pain Location: back Pain Descriptors / Indicators: Discomfort Pain Intervention(s): Limited activity within patient's tolerance, Monitored during session, Repositioned  Home Living                                          Prior Functioning/Environment              Frequency  Min 2X/week        Progress Toward Goals  OT Goals(current goals can now be found in the care plan section)  Progress towards OT goals: Progressing toward goals  Acute Rehab OT Goals Patient Stated Goal: to go home OT Goal Formulation: With patient Time For Goal Achievement: 12/27/23 Potential to Achieve Goals: Good ADL Goals Pt Will Perform Lower Body Bathing: with modified independence;sit to/from stand Pt Will Perform Lower Body Dressing: with modified independence;sit to/from stand Pt Will Transfer to Toilet: with modified independence Additional ADL Goal #1: Pt will indep follow spinal precautions 100% of the time during ADLs  Plan      Co-evaluation                 AM-PAC OT 6 Clicks Daily Activity     Outcome Measure   Help from another person eating meals?: None Help from another person taking care of personal grooming?: A Little Help from another person toileting, which includes using toliet, bedpan, or urinal?: A Little Help from another person bathing (including washing, rinsing, drying)?: A Little Help from another person to put on and taking off regular upper body clothing?: A Little Help from another person to put on and taking off regular lower body clothing?: A Little 6 Click Score: 19    End of Session Equipment Utilized During  Treatment: Gait belt;Rolling walker (2 wheels);Back brace  OT Visit Diagnosis: Unsteadiness on feet (R26.81);Other abnormalities of gait and mobility (R26.89)   Activity Tolerance Patient tolerated treatment well   Patient Left in chair;with call bell/phone within reach   Nurse Communication Mobility status        Time: 0728-0803 OT Time Calculation (min): 35 min  Charges: OT General Charges $OT Visit: 1 Visit OT Treatments $Self Care/Home Management : 23-37 mins  Dick Laine, OTA Acute Rehabilitation Services  Office [phone]   Jeb LITTIE Laine 12/14/2023, 8:11 AM

## 2023-12-14 NOTE — Progress Notes (Addendum)
 Physical Therapy Treatment  Patient Details Name: Jeffrey Rhodes MRN: [id number] DOB: [birth year] Today's Date: 12/14/2023   History of Present Illness Jeffrey Rhodes is a 83 yo male who is s/p Lumbar laminectomy L3-4 and L4-5, L3-4 and L4-5 pedicle screw fixation L3-L4-L5 10/20. PMHx: allergy, arthritis, cataract, CAD, GERD, glaucoma, high cholesterol, HTN, hypothyroid, MI    PT Comments  Pt progressing well with post-op mobility. He was able to demonstrate transfers and ambulation with gross CGA to supervision for safety. Son present for education. Reinforced education on precautions, brace application/wearing schedule, appropriate activity progression, and car transfer. Will continue to follow.      If plan is discharge home, recommend the following: A little help with walking and/or transfers;A little help with bathing/dressing/bathroom;Assistance with cooking/housework;Assist for transportation;Help with stairs or ramp for entrance   Can travel by private vehicle        Equipment Recommendations  BSC/3in1; Agricultural consultant     Recommendations for Other Services       Precautions / Restrictions Precautions Precautions: Fall;Back Precaution Booklet Issued: Yes (comment) Recall of Precautions/Restrictions: Intact Precaution/Restrictions Comments: reviewed precautions Required Braces or Orthoses: Spinal Brace Spinal Brace: Lumbar corset;Applied in sitting position Restrictions Weight Bearing Restrictions Per Provider Order: No     Mobility  Bed Mobility Overal bed mobility: Needs Assistance Bed Mobility: Rolling, Sidelying to Sit, Sit to Sidelying Rolling: Modified independent (Device/Increase time) Sidelying to sit: Supervision     Sit to sidelying: Contact guard assist General bed mobility comments: HOB flat and rails lowered to simulate home environment. No assist required.    Transfers Overall transfer level: Needs assistance Equipment used: Rolling walker (2  wheels) Transfers: Sit to/from Stand Sit to Stand: Contact guard assist           General transfer comment: VC's for hand placement on seated surface for safety. No assist to power up to full stand    Ambulation/Gait Ambulation/Gait assistance: Contact guard assist, Supervision Gait Distance (Feet): 200 Feet Assistive device: Rolling walker (2 wheels) Gait Pattern/deviations: Step-through pattern, Trunk flexed, Decreased stride length Gait velocity: Decreased Gait velocity interpretation: <1.8 ft/sec, indicate of risk for recurrent falls   General Gait Details: VC's for improved posture, closer walker proximity and forward gaze. Hands on guarding provided for safety. No overt LOB noted.   Stairs Stairs: Yes Stairs assistance: Contact guard assist Stair Management: Step to pattern, Forwards Number of Stairs: 3 General stair comments: Son present for education and provided hands on assist. VC's throughout for sequencing and safety.   Wheelchair Mobility     Tilt Bed    Modified Rankin (Stroke Patients Only)       Balance Overall balance assessment: Needs assistance Sitting-balance support: Feet supported Sitting balance-Leahy Scale: Good     Standing balance support: No upper extremity supported, During functional activity Standing balance-Leahy Scale: Fair                              Hotel manager: No apparent difficulties  Cognition Arousal: Alert Behavior During Therapy: WFL for tasks assessed/performed   PT - Cognitive impairments: No apparent impairments                         Following commands: Intact      Cueing Cueing Techniques: Verbal cues  Exercises      General Comments        Pertinent  Vitals/Pain Pain Assessment Pain Assessment: Faces Faces Pain Scale: Hurts a little bit Pain Location: back Pain Descriptors / Indicators: Discomfort Pain Intervention(s): Limited activity within  patient's tolerance, Monitored during session, Repositioned    Home Living                          Prior Function            PT Goals (current goals can now be found in the care plan section) Acute Rehab PT Goals Patient Stated Goal: Return home at d/c PT Goal Formulation: With patient Time For Goal Achievement: 12/20/23 Potential to Achieve Goals: Good Progress towards PT goals: Progressing toward goals    Frequency    Min 5X/week      PT Plan      Co-evaluation              AM-PAC PT 6 Clicks Mobility   Outcome Measure  Help needed turning from your back to your side while in a flat bed without using bedrails?: None Help needed moving from lying on your back to sitting on the side of a flat bed without using bedrails?: A Little Help needed moving to and from a bed to a chair (including a wheelchair)?: A Little Help needed standing up from a chair using your arms (e.g., wheelchair or bedside chair)?: A Little Help needed to walk in hospital room?: A Little Help needed climbing 3-5 steps with a railing? : A Little 6 Click Score: 19    End of Session Equipment Utilized During Treatment: Gait belt;Back brace Activity Tolerance: Patient tolerated treatment well Patient left: in bed;with call bell/phone within reach;with family/visitor present Nurse Communication: Mobility status PT Visit Diagnosis: Unsteadiness on feet (R26.81);Pain Pain - part of body:  (back)     Time: 8861-8787 PT Time Calculation (min) (ACUTE ONLY): 34 min  Charges:    $Gait Training: 23-37 mins PT General Charges $$ ACUTE PT VISIT: 1 Visit                     Leita Sable, PT, DPT Acute Rehabilitation Services Secure Chat Preferred Office: [phone]    Leita JONETTA Sable 12/14/2023, 12:17 PM

## 2023-12-14 NOTE — Plan of Care (Signed)
 Pt doing well. Pt and son given D/C instructions with verbal understanding. Rx's were sent to the pharmacy by MD. Pt's incision is clean and dry with no sign of infection. Pt's IV was removed prior to D/C. Pt D/C'd home via wheelchair per MD order. Pt is stable @ D/C and has no other needs at this time. Rema Fendt, RN

## 2023-12-14 NOTE — Discharge Summary (Signed)
 Physician Discharge Summary  Patient ID: Jeffrey Rhodes MRN: [id number] DOB/AGE: 05/06/1940 83 y.o.  Admit date: 12/12/2023 Discharge date: 12/14/2023  Admission Diagnoses: Lumbar spondylolisthesis with stenosis and neurogenic claudication  Discharge Diagnoses: Lumbar spondylolisthesis.  Lumbar stenosis.  Neurogenic claudication. Principal Problem:   Spondylolisthesis at L4-L5 level   Discharged Condition: good  Hospital Course: Patient tolerated surgery well  Consults: None  Significant Diagnostic Studies: None  Treatments: surgery: See op note  Discharge Exam: Blood pressure 131/86, pulse 83, temperature 97.6 F (36.4 C), temperature source Oral, resp. rate 18, height 6' (1.829 m), weight 92.5 kg, SpO2 96%. Incision is clean and dry Station and gait are intact  Disposition: Discharge disposition: 01-Home or Self Care       Discharge Instructions     Call MD for:  redness, tenderness, or signs of infection (pain, swelling, redness, odor or green/yellow discharge around incision site)   Complete by: As directed    Call MD for:  severe uncontrolled pain   Complete by: As directed    Call MD for:  temperature >100.4   Complete by: As directed    Diet - low sodium heart healthy   Complete by: As directed    Discharge instructions   Complete by: As directed    Okay to shower. Do not apply salves or appointments to incision. No heavy lifting with the upper extremities greater than 10 pounds. May resume driving when not requiring pain medication and patient feels comfortable with doing so.   Discharge wound care:   Complete by: As directed    Okay to shower. Do not apply salves or appointments to incision. No heavy lifting with the upper extremities greater than 10 pounds. May resume driving when not requiring pain medication and patient feels comfortable with doing so.   Incentive spirometry RT   Complete by: As directed    Increase activity slowly   Complete by: As  directed       Allergies as of 12/14/2023       Reactions   Bee Venom Other (See Comments)   unknown unknown   Lisinopril Cough   Losartan Cough        Medication List     TAKE these medications    amLODipine  10 MG tablet Commonly known as: NORVASC  TAKE 1 TABLET BY MOUTH EVERY DAY   aspirin  EC 81 MG tablet Take 81 mg by mouth daily. Swallow whole.   dexamethasone  1 MG tablet Commonly known as: DECADRON  2 tablets twice daily for 2 days, one tablet twice daily for 2 days, one tablet daily for 2 days.   ezetimibe  10 MG tablet Commonly known as: ZETIA  Take 1 tablet (10 mg total) by mouth daily.   fluticasone  50 MCG/ACT nasal spray Commonly known as: FLONASE  PLACE 2 SPRAYS INTO BOTH NOSTRILS DAILY AS NEEDED FOR ALLERGIES OR RHINITIS.   latanoprost 0.005 % ophthalmic solution Commonly known as: XALATAN Place 1 drop into both eyes at bedtime.   levothyroxine  137 MCG tablet Commonly known as: Synthroid  TAKE 1 TABLET BY MOUTH EVERY DAY BEFORE BREAKFAST   methocarbamol 500 MG tablet Commonly known as: ROBAXIN Take 1 tablet (500 mg total) by mouth every 6 (six) hours as needed for muscle spasms.   metoprolol  succinate 25 MG 24 hr tablet Commonly known as: TOPROL -XL Take 1 tablet (25 mg total) by mouth daily.   oxyCODONE-acetaminophen  5-325 MG tablet Commonly known as: PERCOCET/ROXICET Take 1-2 tablets by mouth every 6 (six) hours as needed for  moderate pain (pain score 4-6) or severe pain (pain score 7-10). What changed:  how much to take when to take this reasons to take this   polyethylene glycol 17 g packet Commonly known as: MIRALAX / GLYCOLAX Take 17 g by mouth at bedtime.   rosuvastatin  40 MG tablet Commonly known as: CRESTOR  TAKE 1 TABLET BY MOUTH EVERY DAY   tamsulosin  0.4 MG Caps capsule Commonly known as: FLOMAX  TAKE 1 CAPSULE BY MOUTH EVERY DAY   traMADol  50 MG tablet Commonly known as: ULTRAM  TAKE 1 TABLET NIGHTLY AS NEEDED FOR PAIN.    zolpidem  10 MG tablet Commonly known as: AMBIEN  Take 0.5-1 tablets (5-10 mg total) by mouth at bedtime as needed. for sleep               Discharge Care Instructions  (From admission, onward)           Start     Ordered   12/14/23 0000  Discharge wound care:       Comments: Okay to shower. Do not apply salves or appointments to incision. No heavy lifting with the upper extremities greater than 10 pounds. May resume driving when not requiring pain medication and patient feels comfortable with doing so.   12/14/23 0939            Contact information for follow-up providers     [facility] Follow up.   Specialty: Home Health Services Why: Jeffrey Rhodes will contact you for the first home visit. Contact information: [address] [place] [address] [phone]              Contact information for after-discharge care     Home Medical Care     [facility]) .   Service: Home Health Services Contact information: [address] [place]  [phone]                     Signed: Victory JINNY Rhodes 12/14/2023, 9:41 AM

## 2023-12-14 NOTE — Anesthesia Postprocedure Evaluation (Signed)
 Anesthesia Post Note  Patient: Jeffrey Rhodes  Procedure(s) Performed: LUMBAR THREE-FOUR, LUMBAR FOUR-FIVE POSTERIOR LUMBAR INTERBODY FUSION (Back)     Patient location during evaluation: PACU Anesthesia Type: General Level of consciousness: sedated and patient cooperative Pain management: pain level controlled Vital Signs Assessment: post-procedure vital signs reviewed and stable Respiratory status: spontaneous breathing Cardiovascular status: stable Anesthetic complications: no   No notable events documented.  Last Vitals:  Vitals:   12/13/23 2236 12/14/23 0357  BP: 131/74 124/78  Pulse: 83 76  Resp: 16 18  Temp: 36.7 C 36.8 C  SpO2: 97% 98%    Last Pain:  Vitals:   12/14/23 0357  TempSrc: Oral  PainSc: 7                  Norleen Pope

## 2023-12-17 DIAGNOSIS — M48062 Spinal stenosis, lumbar region with neurogenic claudication: Secondary | ICD-10-CM | POA: Diagnosis not present

## 2023-12-17 DIAGNOSIS — Z4789 Encounter for other orthopedic aftercare: Secondary | ICD-10-CM | POA: Diagnosis not present

## 2023-12-17 DIAGNOSIS — M4316 Spondylolisthesis, lumbar region: Secondary | ICD-10-CM | POA: Diagnosis not present

## 2023-12-17 DIAGNOSIS — Z9181 History of falling: Secondary | ICD-10-CM | POA: Diagnosis not present

## 2023-12-17 DIAGNOSIS — Z951 Presence of aortocoronary bypass graft: Secondary | ICD-10-CM | POA: Diagnosis not present

## 2023-12-17 DIAGNOSIS — Z7982 Long term (current) use of aspirin: Secondary | ICD-10-CM | POA: Diagnosis not present

## 2023-12-17 DIAGNOSIS — E7800 Pure hypercholesterolemia, unspecified: Secondary | ICD-10-CM | POA: Diagnosis not present

## 2023-12-17 DIAGNOSIS — Z7952 Long term (current) use of systemic steroids: Secondary | ICD-10-CM | POA: Diagnosis not present

## 2023-12-30 ENCOUNTER — Other Ambulatory Visit: Payer: Self-pay | Admitting: Cardiology

## 2023-12-30 ENCOUNTER — Telehealth: Payer: Self-pay | Admitting: Cardiology

## 2023-12-30 DIAGNOSIS — E785 Hyperlipidemia, unspecified: Secondary | ICD-10-CM

## 2023-12-30 MED ORDER — EZETIMIBE 10 MG PO TABS: 10.0000 mg | ORAL_TABLET | Freq: Every day | ORAL | 0 refills | Status: AC

## 2023-12-30 NOTE — Telephone Encounter (Signed)
 Refill sent to [facility] [place].

## 2023-12-30 NOTE — Telephone Encounter (Signed)
*  STAT* If patient is at the pharmacy, call can be transferred to refill team.   1. Which medications need to be refilled? (please list name of each medication and dose if known) ezetimibe  (ZETIA ) 10 MG tablet   2. Which pharmacy/location (including street and city if local pharmacy) is medication to be sent to? [id number] [place], [place] - [address]  3. Do they need a 30 day or 90 day supply?    90 day supply  [facility] informed patient that he doesn't have any refills remaining. He would like a new prescription [facility] [place] if possible.

## 2024-01-06 DIAGNOSIS — M4316 Spondylolisthesis, lumbar region: Secondary | ICD-10-CM | POA: Diagnosis not present

## 2024-01-09 ENCOUNTER — Encounter: Admitting: Family Medicine

## 2024-01-12 NOTE — Progress Notes (Signed)
 HPI: Follow-up coronary artery disease. Patient had coronary artery bypass graft in [place] in 2014. Nuclear study 3/17 showed ejection fraction of 58%. No infarct or ischemia. Abd ultrasound 9/19 showed no AAA.  Carotid Dopplers November 2024 showed 1 to 39% bilateral stenosis.  Echocardiogram October 2025 showed normal LV function, mild left ventricular hypertrophy, mild to moderate aortic stenosis with mean gradient 17 mmHg, aortic valve area 1.1 cm and dimensionless index 0.29. Since last seen patient denies dyspnea, chest pain, palpitations or syncope.  Current Outpatient Medications  Medication Sig Dispense Refill   amLODipine  (NORVASC ) 10 MG tablet TAKE 1 TABLET BY MOUTH EVERY DAY 90 tablet 2   aspirin  EC 81 MG tablet Take 81 mg by mouth daily. Swallow whole.     ezetimibe  (ZETIA ) 10 MG tablet Take 1 tablet (10 mg total) by mouth daily. 90 tablet 0   fluticasone  (FLONASE ) 50 MCG/ACT nasal spray PLACE 2 SPRAYS INTO BOTH NOSTRILS DAILY AS NEEDED FOR ALLERGIES OR RHINITIS. 48 mL 3   latanoprost  (XALATAN ) 0.005 % ophthalmic solution Place 1 drop into both eyes at bedtime.     levothyroxine  (SYNTHROID ) 137 MCG tablet Take 1 tablet (137 mcg total) by mouth daily before breakfast. 90 tablet 0   methocarbamol  (ROBAXIN ) 500 MG tablet Take 1 tablet (500 mg total) by mouth every 6 (six) hours as needed for muscle spasms. 30 tablet 3   metoprolol  succinate (TOPROL -XL) 25 MG 24 hr tablet Take 1 tablet (25 mg total) by mouth daily. Take with or immediately following a meal 90 tablet 0   oxyCODONE -acetaminophen  (PERCOCET/ROXICET) 5-325 MG tablet Take 1-2 tablets by mouth every 6 (six) hours as needed for moderate pain (pain score 4-6) or severe pain (pain score 7-10). 40 tablet 0   polyethylene glycol (MIRALAX  / GLYCOLAX ) 17 g packet Take 17 g by mouth at bedtime.     rosuvastatin  (CRESTOR ) 40 MG tablet TAKE 1 TABLET BY MOUTH EVERY DAY 90 tablet 3   tamsulosin  (FLOMAX ) 0.4 MG CAPS capsule TAKE 1  CAPSULE BY MOUTH EVERY DAY 90 capsule 3   zolpidem  (AMBIEN ) 10 MG tablet Take 0.5-1 tablets (5-10 mg total) by mouth at bedtime as needed. for sleep 30 tablet 5   No current facility-administered medications for this visit.     Past Medical History:  Diagnosis Date   Allergy    Arthritis    Cataract    Coronary artery disease    GERD (gastroesophageal reflux disease)    Glaucoma    Heart murmur    High cholesterol    Hypertension    Hypothyroid    Myocardial infarction [facility])     Past Surgical History:  Procedure Laterality Date   CORONARY ARTERY BYPASS GRAFT  2014   [place]   DENTAL RESTORATION/EXTRACTION WITH X-RAY      Social History   Socioeconomic History   Marital status: Widowed    Spouse name: Not on file   Number of children: 2   Years of education: Not on file   Highest education level: Associate degree: occupational, scientist, product/process development, or vocational program  Occupational History   Occupation: Paramedic  Tobacco Use   Smoking status: Former    Types: Cigarettes   Smokeless tobacco: Never   Tobacco comments:    2003 quit  Vaping Use   Vaping status: Never Used  Substance and Sexual Activity   Alcohol use: Yes    Alcohol/week: 0.0 standard drinks of alcohol    Comment:  occasional   Drug use: No   Sexual activity: Not Currently  Other Topics Concern   Not on file  Social History Narrative   Not on file   Social Drivers of Health   Financial Resource Strain: Low Risk  (10/27/2023)   Overall Financial Resource Strain (CARDIA)    Difficulty of Paying Living Expenses: Not very hard  Food Insecurity: No Food Insecurity (10/27/2023)   Hunger Vital Sign    Worried About Running Out of Food in the Last Year: Never true    Ran Out of Food in the Last Year: Never true  Transportation Needs: Unknown (10/27/2023)   PRAPARE - Administrator, Civil Service (Medical): No    Lack of Transportation (Non-Medical): Not on file  Physical Activity:  Insufficiently Active (10/27/2023)   Exercise Vital Sign    Days of Exercise per Week: 5 days    Minutes of Exercise per Session: 10 min  Stress: No Stress Concern Present (10/27/2023)   [employer] of Occupational Health - Occupational Stress Questionnaire    Feeling of Stress: Only a little  Social Connections: Moderately Isolated (10/27/2023)   Social Connection and Isolation Panel    Frequency of Communication with Friends and Family: Three times a week    Frequency of Social Gatherings with Friends and Family: Once a week    Attends Religious Services: 1 to 4 times per year    Active Member of [employer] or Organizations: No    Attends Banker Meetings: Not on file    Marital Status: Widowed  Intimate Partner Violence: Not At Risk (12/28/2022)   Humiliation, Afraid, Rape, and Kick questionnaire    Fear of Current or Ex-Partner: No    Emotionally Abused: No    Physically Abused: No    Sexually Abused: No    Family History  Problem Relation Age of Onset   Breast cancer Mother    Diabetes Brother    Heart disease Brother    Colon cancer Neg Hx    Esophageal cancer Neg Hx    Rectal cancer Neg Hx    Stomach cancer Neg Hx     ROS: Back pain following recent surgery but no fevers or chills, productive cough, hemoptysis, dysphasia, odynophagia, melena, hematochezia, dysuria, hematuria, rash, seizure activity, orthopnea, PND, pedal edema, claudication. Remaining systems are negative.  Physical Exam: Well-developed well-nourished in no acute distress.  Skin is warm and dry.  HEENT is normal.  Neck is supple.  Chest is clear to auscultation with normal expansion.  Cardiovascular exam is regular rate and rhythm.  2/6 systolic murmur left sternal border. Abdominal exam nontender or distended. No masses palpated. Extremities show no edema. neuro grossly intact  EKG Interpretation Date/Time:  Wednesday January 25 2024 08:54:07 EST Ventricular Rate:  82 PR  Interval:  176 QRS Duration:  132 QT Interval:  402 QTC Calculation: 469 R Axis:   10  Text Interpretation: Sinus rhythm with sinus arrhythmia with occasional Premature ventricular complexes Right bundle branch block Inferior infarct Confirmed by Pietro Rogue ([id number]) on 01/25/2024 9:02:55 AM    A/P  1 aortic stenosis-mild to moderate on most recent echocardiogram.  Plan follow-up study October 2026.  Patient understands he will likely require TAVR in the future.  We discussed the symptoms to be aware of including increasing dyspnea, chest pain or syncope.  2 coronary artery disease status post coronary bypass and graft-he is not having chest pain.  Continue medical therapy with aspirin  and  statin.  3 hypertension-patient's blood pressure is controlled.  Continue present medical regimen.  4 hyperlipidemia-continue statin.  [name], MD

## 2024-01-18 ENCOUNTER — Ambulatory Visit: Admitting: Cardiology

## 2024-01-22 ENCOUNTER — Other Ambulatory Visit: Payer: Self-pay | Admitting: Family Medicine

## 2024-01-22 DIAGNOSIS — I10 Essential (primary) hypertension: Secondary | ICD-10-CM

## 2024-01-24 ENCOUNTER — Other Ambulatory Visit: Payer: Self-pay | Admitting: Family Medicine

## 2024-01-24 DIAGNOSIS — E039 Hypothyroidism, unspecified: Secondary | ICD-10-CM

## 2024-01-25 ENCOUNTER — Encounter: Payer: Self-pay | Admitting: Cardiology

## 2024-01-25 ENCOUNTER — Ambulatory Visit: Attending: Cardiology | Admitting: Cardiology

## 2024-01-25 VITALS — BP 100/60 | HR 82 | Ht 72.0 in | Wt 199.0 lb

## 2024-01-25 DIAGNOSIS — I10 Essential (primary) hypertension: Secondary | ICD-10-CM

## 2024-01-25 DIAGNOSIS — Z951 Presence of aortocoronary bypass graft: Secondary | ICD-10-CM

## 2024-01-25 DIAGNOSIS — E785 Hyperlipidemia, unspecified: Secondary | ICD-10-CM

## 2024-01-25 DIAGNOSIS — I350 Nonrheumatic aortic (valve) stenosis: Secondary | ICD-10-CM | POA: Diagnosis not present

## 2024-01-25 NOTE — Patient Instructions (Signed)

## 2024-01-30 ENCOUNTER — Ambulatory Visit: Admitting: Family Medicine

## 2024-01-30 ENCOUNTER — Encounter: Payer: Self-pay | Admitting: Family Medicine

## 2024-01-30 ENCOUNTER — Ambulatory Visit: Payer: Self-pay | Admitting: Family Medicine

## 2024-01-30 VITALS — BP 110/64 | HR 86 | Temp 98.0°F | Resp 16 | Ht 73.0 in | Wt 202.2 lb

## 2024-01-30 DIAGNOSIS — R42 Dizziness and giddiness: Secondary | ICD-10-CM | POA: Diagnosis not present

## 2024-01-30 DIAGNOSIS — Z23 Encounter for immunization: Secondary | ICD-10-CM

## 2024-01-30 DIAGNOSIS — G4700 Insomnia, unspecified: Secondary | ICD-10-CM

## 2024-01-30 DIAGNOSIS — I10 Essential (primary) hypertension: Secondary | ICD-10-CM

## 2024-01-30 DIAGNOSIS — E039 Hypothyroidism, unspecified: Secondary | ICD-10-CM

## 2024-01-30 DIAGNOSIS — E785 Hyperlipidemia, unspecified: Secondary | ICD-10-CM | POA: Diagnosis not present

## 2024-01-30 LAB — CBC
HCT: 36.2 % — ABNORMAL LOW (ref 39.0–52.0)
Hemoglobin: 12.2 g/dL — ABNORMAL LOW (ref 13.0–17.0)
MCHC: 33.8 g/dL (ref 30.0–36.0)
MCV: 94.9 fl (ref 78.0–100.0)
Platelets: 238 K/uL (ref 150.0–400.0)
RBC: 3.81 Mil/uL — ABNORMAL LOW (ref 4.22–5.81)
RDW: 14 % (ref 11.5–15.5)
WBC: 10.9 K/uL — ABNORMAL HIGH (ref 4.0–10.5)

## 2024-01-30 LAB — MAGNESIUM: Magnesium: 1.8 mg/dL (ref 1.5–2.5)

## 2024-01-30 LAB — COMPREHENSIVE METABOLIC PANEL WITH GFR
ALT: 13 U/L (ref 0–53)
AST: 14 U/L (ref 0–37)
Albumin: 3.8 g/dL (ref 3.5–5.2)
Alkaline Phosphatase: 69 U/L (ref 39–117)
BUN: 18 mg/dL (ref 6–23)
CO2: 30 meq/L (ref 19–32)
Calcium: 9.6 mg/dL (ref 8.4–10.5)
Chloride: 102 meq/L (ref 96–112)
Creatinine, Ser: 0.89 mg/dL (ref 0.40–1.50)
GFR: 80.68 mL/min (ref >60.00)
Glucose, Bld: 101 mg/dL — ABNORMAL HIGH (ref 70–99)
Potassium: 4.3 meq/L (ref 3.5–5.1)
Sodium: 140 meq/L (ref 135–145)
Total Bilirubin: 0.5 mg/dL (ref 0.2–1.2)
Total Protein: 6.9 g/dL (ref 6.0–8.3)

## 2024-01-30 LAB — LIPID PANEL
Cholesterol: 117 mg/dL (ref 0–200)
HDL: 44.3 mg/dL (ref >39.00)
LDL Cholesterol: 49 mg/dL (ref 0–99)
NonHDL: 73
Total CHOL/HDL Ratio: 3
Triglycerides: 121 mg/dL (ref 0.0–149.0)
VLDL: 24.2 mg/dL (ref 0.0–40.0)

## 2024-01-30 LAB — T4, FREE: Free T4: 1.65 ng/dL — ABNORMAL HIGH (ref 0.60–1.60)

## 2024-01-30 LAB — TSH: TSH: 0.13 u[IU]/mL — ABNORMAL LOW (ref 0.35–5.50)

## 2024-01-30 MED ORDER — ZOLPIDEM TARTRATE 10 MG PO TABS: 5.0000 mg | ORAL_TABLET | Freq: Every evening | ORAL | 1 refills | Status: AC | PRN

## 2024-01-30 MED ORDER — ROSUVASTATIN CALCIUM 40 MG PO TABS: 40.0000 mg | ORAL_TABLET | Freq: Every day | ORAL | 3 refills | Status: AC

## 2024-01-30 MED ORDER — METOPROLOL SUCCINATE ER 25 MG PO TB24: 25.0000 mg | ORAL_TABLET | Freq: Every day | ORAL | 3 refills | Status: AC

## 2024-01-30 MED ORDER — AMLODIPINE BESYLATE 10 MG PO TABS: 10.0000 mg | ORAL_TABLET | Freq: Every day | ORAL | 2 refills | Status: AC

## 2024-01-30 MED ORDER — TAMSULOSIN HCL 0.4 MG PO CAPS: 0.4000 mg | ORAL_CAPSULE | Freq: Every day | ORAL | 3 refills | Status: AC

## 2024-01-30 MED ORDER — LEVOTHYROXINE SODIUM 137 MCG PO TABS: 137.0000 ug | ORAL_TABLET | Freq: Every day | ORAL | 3 refills | Status: DC

## 2024-01-30 NOTE — Progress Notes (Signed)
 Chief Complaint  Patient presents with   Follow-up    Follow Up    Subjective Jeffrey Rhodes is a 83 y.o. male who presents for hypertension follow up. He does not routinely monitor home blood pressures. He is compliant with medications- Norvasc  10 mg/d, Toprol  XL 25 mg/d. Patient has these side effects of medication: none He is adhering to a healthy diet overall. Current exercise: walking No current CP or SOB.  Jeffrey Rhodes been having intermittent lightheadedness over the past couple days.  Nothing currently today.  He does stay well-hydrated overall.  Hypothyroidism Patient presents for follow-up of hypothyroidism.  Reports compliance with medication- levothyroxine  137 mcg/d. Current symptoms include: denies fatigue, weight changes, heat/cold intolerance, bowel/skin changes or CVS symptoms He believes his dose should be not significantly changed  Hyperlipidemia Patient presents for dyslipidemia follow up. Currently being treated with Crestor  40 mg/d and compliance with treatment thus far has been good. He denies myalgias. Diet/exercise as above. The patient is known to have coexisting coronary artery disease.   Past Medical History:  Diagnosis Date   Allergy    Arthritis    Cataract    Coronary artery disease    GERD (gastroesophageal reflux disease)    Glaucoma    Heart murmur    High cholesterol    Hypertension    Hypothyroid    Myocardial infarction (HCC)     Exam BP 110/64 (BP Location: Left Arm, Patient Position: Sitting)   Pulse 86   Temp 98 F (36.7 C) (Oral)   Resp 16   Ht 6' 1 (1.854 m)   Wt 202 lb 3.2 oz (91.7 kg)   SpO2 95%   BMI 26.68 kg/m  General:  well developed, well nourished, in no apparent distress Heart: Regular rate, irregular rhythm, no bruits, no LE edema Lungs: clear to auscultation, no accessory muscle use Neck: supple, symmetric, no thyromegaly or nodules noted Psych: well oriented with normal range of affect and appropriate  judgment/insight  Essential hypertension, benign - Plan: amLODipine  (NORVASC ) 10 MG tablet, metoprolol  succinate (TOPROL -XL) 25 MG 24 hr tablet, CBC, Comprehensive metabolic panel with GFR, Lipid panel  Hypothyroidism, unspecified type - Plan: levothyroxine  (SYNTHROID ) 137 MCG tablet, TSH, T4, free  Dyslipidemia - Plan: rosuvastatin  (CRESTOR ) 40 MG tablet  Insomnia, unspecified type - Plan: zolpidem  (AMBIEN ) 10 MG tablet  Lightheaded - Plan: Magnesium, EKG 12-Lead  Need for influenza vaccination - Plan: Flu vaccine HIGH DOSE PF(Fluzone Trivalent)  Chronic, probably stable.  Continue Norvasc  10 mg daily, Toprol -XL 25 mg daily.  EKG unchanged from last week.  Counseled on diet and exercise. Chronic, stable.  Continue levothyroxine  137 mcg daily.  Check labs. Chronic, stable.  Continue Crestor  40 mg daily. Continue Ambien  10 mg daily. EKG today as above. Flu shot today. F/u in 6 mo. The patient and his son voiced understanding and agreement to the plan.  Jeffrey [place], DO 01/30/24  12:04 PM

## 2024-01-30 NOTE — Patient Instructions (Signed)
Give us 2-3 business days to get the results of your labs back.   Stay hydrated.  Let us know if you need anything.  

## 2024-01-31 ENCOUNTER — Other Ambulatory Visit: Payer: Self-pay

## 2024-01-31 DIAGNOSIS — D72818 Other decreased white blood cell count: Secondary | ICD-10-CM

## 2024-01-31 DIAGNOSIS — Z9189 Other specified personal risk factors, not elsewhere classified: Secondary | ICD-10-CM

## 2024-01-31 DIAGNOSIS — R7989 Other specified abnormal findings of blood chemistry: Secondary | ICD-10-CM

## 2024-02-02 ENCOUNTER — Telehealth: Payer: Self-pay

## 2024-02-02 ENCOUNTER — Other Ambulatory Visit (HOSPITAL_COMMUNITY): Payer: Self-pay

## 2024-02-02 ENCOUNTER — Other Ambulatory Visit: Payer: Self-pay | Admitting: Family Medicine

## 2024-02-02 MED ORDER — LEVOTHYROXINE SODIUM 125 MCG PO TABS: 125.0000 ug | ORAL_TABLET | Freq: Every day | ORAL | 3 refills | Status: DC

## 2024-02-02 MED ORDER — SYNTHROID 125 MCG PO TABS: 125.0000 ug | ORAL_TABLET | Freq: Every day | ORAL | 0 refills | Status: AC

## 2024-02-02 NOTE — Telephone Encounter (Signed)
 Needs PA for brand name Synthroid , he is unable to tolerate levothyroxine .

## 2024-02-02 NOTE — Telephone Encounter (Signed)
 See results, which dose of levothyroxine  did you want to change it to?

## 2024-02-02 NOTE — Telephone Encounter (Signed)
 Copied from CRM #[id number]. Topic: Clinical - Prescription Issue >> Feb 02, 2024  8:59 AM Emylou G wrote: Reason for CRM: patient called.. checking status of refill of levothyroxine  (SYNTHROID ) 137 MCG tablet BUT was also told it would be a lower dosage.SABRA Pls review.. call patient

## 2024-02-03 ENCOUNTER — Telehealth: Payer: Self-pay

## 2024-02-03 ENCOUNTER — Other Ambulatory Visit (HOSPITAL_COMMUNITY): Payer: Self-pay

## 2024-02-03 NOTE — Telephone Encounter (Signed)
 Completed a tier exception form and faxed to [employer]  Faxed to [phone]

## 2024-02-07 NOTE — Telephone Encounter (Signed)
 Pharmacy Patient Advocate Encounter  Received notification from [facility] [employer] that Tier exception for Synthroid  125mcg tabs has been DENIED.  Full denial letter will be uploaded to the media tab. See denial reason below.

## 2024-02-08 ENCOUNTER — Other Ambulatory Visit: Payer: Self-pay

## 2024-02-08 ENCOUNTER — Other Ambulatory Visit (HOSPITAL_COMMUNITY): Payer: Self-pay

## 2024-02-08 MED ORDER — LEVOTHYROXINE SODIUM 125 MCG PO TABS: 125.0000 ug | ORAL_TABLET | Freq: Every day | ORAL | 3 refills | Status: AC

## 2024-02-08 NOTE — Telephone Encounter (Signed)
 Generic Levothyroxine  sent to pharmacy.

## 2024-02-08 NOTE — Telephone Encounter (Signed)
 Okay to send generic levothyroxine  at the same dose-125 mcg daily.  Thank you.

## 2024-02-21 ENCOUNTER — Other Ambulatory Visit: Payer: Self-pay | Admitting: Family Medicine

## 2024-02-25 ENCOUNTER — Emergency Department (HOSPITAL_BASED_OUTPATIENT_CLINIC_OR_DEPARTMENT_OTHER)

## 2024-02-25 ENCOUNTER — Other Ambulatory Visit: Payer: Self-pay

## 2024-02-25 ENCOUNTER — Emergency Department (HOSPITAL_BASED_OUTPATIENT_CLINIC_OR_DEPARTMENT_OTHER)
Admission: EM | Admit: 2024-02-25 | Discharge: 2024-02-25 | Disposition: A | Discharge status: Home / Self Care | Attending: Emergency Medicine | Admitting: Emergency Medicine

## 2024-02-25 DIAGNOSIS — Z951 Presence of aortocoronary bypass graft: Secondary | ICD-10-CM | POA: Diagnosis not present

## 2024-02-25 DIAGNOSIS — E039 Hypothyroidism, unspecified: Secondary | ICD-10-CM | POA: Diagnosis not present

## 2024-02-25 DIAGNOSIS — Z7982 Long term (current) use of aspirin: Secondary | ICD-10-CM | POA: Insufficient documentation

## 2024-02-25 DIAGNOSIS — R059 Cough, unspecified: Secondary | ICD-10-CM | POA: Diagnosis present

## 2024-02-25 DIAGNOSIS — J181 Lobar pneumonia, unspecified organism: Secondary | ICD-10-CM | POA: Insufficient documentation

## 2024-02-25 DIAGNOSIS — I2510 Atherosclerotic heart disease of native coronary artery without angina pectoris: Secondary | ICD-10-CM | POA: Diagnosis not present

## 2024-02-25 DIAGNOSIS — Z79899 Other long term (current) drug therapy: Secondary | ICD-10-CM | POA: Diagnosis not present

## 2024-02-25 DIAGNOSIS — J189 Pneumonia, unspecified organism: Secondary | ICD-10-CM

## 2024-02-25 DIAGNOSIS — I10 Essential (primary) hypertension: Secondary | ICD-10-CM | POA: Diagnosis not present

## 2024-02-25 LAB — CBC WITH DIFFERENTIAL/PLATELET
Abs Immature Granulocytes: 0.04 K/uL (ref 0.00–0.07)
Basophils Absolute: 0.1 K/uL (ref 0.0–0.1)
Basophils Relative: 1 %
Eosinophils Absolute: 0 K/uL (ref 0.0–0.5)
Eosinophils Relative: 0 %
HCT: 39.2 % (ref 39.0–52.0)
Hemoglobin: 13.1 g/dL (ref 13.0–17.0)
Immature Granulocytes: 1 %
Lymphocytes Relative: 27 %
Lymphs Abs: 2.3 K/uL (ref 0.7–4.0)
MCH: 31.1 pg (ref 26.0–34.0)
MCHC: 33.4 g/dL (ref 30.0–36.0)
MCV: 93.1 fL (ref 80.0–100.0)
Monocytes Absolute: 1.1 K/uL — ABNORMAL HIGH (ref 0.1–1.0)
Monocytes Relative: 13 %
Neutro Abs: 5 K/uL (ref 1.7–7.7)
Neutrophils Relative %: 58 %
Platelets: 263 K/uL (ref 150–400)
RBC: 4.21 MIL/uL — ABNORMAL LOW (ref 4.22–5.81)
RDW: 12.5 % (ref 11.5–15.5)
WBC: 8.5 K/uL (ref 4.0–10.5)
nRBC: 0 % (ref 0.0–0.2)

## 2024-02-25 LAB — BASIC METABOLIC PANEL WITH GFR
Anion gap: 13 (ref 5–15)
BUN: 16 mg/dL (ref 8–23)
CO2: 23 mmol/L (ref 22–32)
Calcium: 9.7 mg/dL (ref 8.9–10.3)
Chloride: 102 mmol/L (ref 98–111)
Creatinine, Ser: 0.76 mg/dL (ref 0.61–1.24)
GFR, Estimated: >60 mL/min
Glucose, Bld: 104 mg/dL — ABNORMAL HIGH (ref 70–99)
Potassium: 3.9 mmol/L (ref 3.5–5.1)
Sodium: 138 mmol/L (ref 135–145)

## 2024-02-25 MED ORDER — DOXYCYCLINE HYCLATE 100 MG PO CAPS: 100.0000 mg | ORAL_CAPSULE | Freq: Two times a day (BID) | ORAL | 0 refills | Status: DC

## 2024-02-25 MED ORDER — DOXYCYCLINE HYCLATE 100 MG PO TABS
100.0000 mg | ORAL_TABLET | Freq: Once | ORAL | Status: AC
  Administered 2024-02-25: 100 mg via ORAL
  Filled 2024-02-25: qty 1

## 2024-02-25 MED ORDER — AMOXICILLIN 500 MG PO CAPS: 500.0000 mg | ORAL_CAPSULE | Freq: Three times a day (TID) | ORAL | 0 refills | Status: DC

## 2024-02-25 MED ORDER — SODIUM CHLORIDE 0.9 % IV SOLN
1.0000 g | Freq: Once | INTRAVENOUS | Status: AC
  Administered 2024-02-25: 1 g via INTRAVENOUS
  Filled 2024-02-25: qty 10

## 2024-02-25 NOTE — Discharge Instructions (Signed)
 You appear to have a pneumonia in your left lung.  I am discharging with 2 antibiotics.  The first is doxycycline .  You take this once in the morning and once in the evening for the next 10 days.  The second is amoxicillin .  You take this 3 times per day for the next 1 week.  Follow-up with your primary care doctor next week.  Return to the emergency room if you develop confusion, fever, worsening shortness of breath.

## 2024-02-25 NOTE — ED Notes (Signed)
 Discharge instructions reviewed with patient. Patient verbalizes understanding, no further questions at this time. Medications/prescriptions and follow up information provided. No acute distress noted at time of departure.

## 2024-02-25 NOTE — ED Triage Notes (Signed)
 Pt c/o upper middle CP and BUE pain into shoulders since last night; took Tylenol  with some relief; reports congestion and prod cough (unsure of color)

## 2024-02-25 NOTE — ED Notes (Signed)
 Patient transferred from waiting room to ED treatment room. Assuming pt care at this time.

## 2024-02-25 NOTE — ED Provider Notes (Signed)
 " [place] [facility] Provider Note  CSN: [id number] Arrival date & time: 02/25/24 1319  Chief Complaint(s) Chest Pain  HPI Jeffrey Rhodes is a 84 y.o. male who is here today for congestion, productive cough, pain in the back and up to his shoulders that began over the last couple of days.  He has been taking over-the-counter medications without improvement.  He has not felt as though his had a fever or chills.   Past Medical History Past Medical History:  Diagnosis Date   Allergy    Arthritis    Cataract    Coronary artery disease    GERD (gastroesophageal reflux disease)    Glaucoma    Heart murmur    High cholesterol    Hypertension    Hypothyroid    Myocardial infarction [facility])    Patient Active Problem List   Diagnosis Date Noted   Spondylolisthesis at L4-L5 level 12/12/2023   Abdominal aortic aneurysm (AAA) without rupture 06/09/2020   Coronary artery disease involving native coronary artery of native heart with angina pectoris 12/12/2019   Chronic pain of both shoulders 05/31/2018   Benign prostatic hyperplasia 12/12/2017   Prediabetes 12/12/2017   Arthritis 10/10/2017   Chest pain 04/27/2015   Esophageal reflux 10/05/2012   Non-ST elevation MI (NSTEMI) (HCC) 09/18/2012   S/P CABG (coronary artery bypass graft) 09/18/2012   Dyslipidemia 09/18/2012   Sinus arrhythmia 09/18/2012   Essential hypertension, benign 09/18/2012   Hypothyroidism 09/18/2012   Insomnia 09/18/2012   Home Medication(s) Prior to Admission medications  Medication Sig Start Date End Date Taking? Authorizing Provider  amoxicillin  (AMOXIL ) 500 MG capsule Take 1 capsule (500 mg total) by mouth 3 (three) times daily. 02/25/24  Yes Mannie Pac T, DO  doxycycline  (VIBRAMYCIN ) 100 MG capsule Take 1 capsule (100 mg total) by mouth 2 (two) times daily. 02/25/24  Yes Mannie Pac T, DO  amLODipine  (NORVASC ) 10 MG tablet Take 1 tablet (10 mg total) by mouth daily.  01/30/24   Frann Mabel Mt, DO  aspirin  EC 81 MG tablet Take 81 mg by mouth daily. Swallow whole.    [provider]  ezetimibe  (ZETIA ) 10 MG tablet Take 1 tablet (10 mg total) by mouth daily. 12/30/23   Pietro Redell RAMAN, MD  fluticasone  (FLONASE ) 50 MCG/ACT nasal spray PLACE 2 SPRAYS INTO BOTH NOSTRILS DAILY AS NEEDED FOR ALLERGIES OR RHINITIS. 02/21/24   Frann Mabel Mt, DO  latanoprost  (XALATAN ) 0.005 % ophthalmic solution Place 1 drop into both eyes at bedtime.    [provider]  levothyroxine  (SYNTHROID ) 125 MCG tablet Take 1 tablet (125 mcg total) by mouth daily. 02/08/24   Frann Mabel Mt, DO  methocarbamol  (ROBAXIN ) 500 MG tablet Take 1 tablet (500 mg total) by mouth every 6 (six) hours as needed for muscle spasms. 12/14/23   Colon Shove, MD  metoprolol  succinate (TOPROL -XL) 25 MG 24 hr tablet Take 1 tablet (25 mg total) by mouth daily. Take with or immediately following a meal 01/30/24   Wendling, Mabel Mt, DO  rosuvastatin  (CRESTOR ) 40 MG tablet Take 1 tablet (40 mg total) by mouth daily. 01/30/24   Frann Mabel Mt, DO  SYNTHROID  125 MCG tablet Take 1 tablet (125 mcg total) by mouth daily before breakfast. 02/02/24   Wendling, Mabel Mt, DO  tamsulosin  (FLOMAX ) 0.4 MG CAPS capsule Take 1 capsule (0.4 mg total) by mouth daily. 01/30/24   Frann Mabel Mt, DO  zolpidem  (AMBIEN ) 10 MG tablet Take  0.5-1 tablets (5-10 mg total) by mouth at bedtime as needed. for sleep 01/30/24   Frann Mabel Mt, DO                                                                                                                                    Past Surgical History Past Surgical History:  Procedure Laterality Date   CORONARY ARTERY BYPASS GRAFT  2014   [place]   DENTAL RESTORATION/EXTRACTION WITH X-RAY     Family History Family History  Problem Relation Age of Onset   Breast cancer Mother    Diabetes Brother    Heart disease  Brother    Colon cancer Neg Hx    Esophageal cancer Neg Hx    Rectal cancer Neg Hx    Stomach cancer Neg Hx     Social History Social History[1] Allergies Bee venom, Lisinopril, and Losartan  Review of Systems Review of Systems  Physical Exam Vital Signs  I have reviewed the triage vital signs BP (!) 145/68 (BP Location: Left Arm)   Pulse 67   Temp 97.6 F (36.4 C) (Oral)   Resp 20   Ht 6' (1.829 m)   Wt 90.3 kg   SpO2 100%   BMI 26.99 kg/m   Physical Exam Vitals and nursing note reviewed.  Constitutional:      Appearance: He is not toxic-appearing.  Eyes:     Pupils: Pupils are equal, round, and reactive to light.  Cardiovascular:     Rate and Rhythm: Normal rate.     Heart sounds: Normal heart sounds.  Pulmonary:     Effort: Pulmonary effort is normal. No respiratory distress.     Comments: Rhonchi present in the left lower lobe.  All other lung fields clear Abdominal:     Palpations: Abdomen is soft.  Musculoskeletal:        General: Normal range of motion.  Skin:    General: Skin is warm.  Neurological:     General: No focal deficit present.     Mental Status: He is alert.     ED Results and Treatments Labs (all labs ordered are listed, but only abnormal results are displayed) Labs Reviewed  BASIC METABOLIC PANEL WITH GFR - Abnormal; Notable for the following components:      Result Value   Glucose, Bld 104 (*)    All other components within normal limits  CBC WITH DIFFERENTIAL/PLATELET - Abnormal; Notable for the following components:   RBC 4.21 (*)    Monocytes Absolute 1.1 (*)    All other components within normal limits  Radiology DG Chest 2 View Result Date: 02/25/2024 CLINICAL DATA:  Chest pain and productive cough. EXAM: CHEST - 2 VIEW COMPARISON:  04/27/2015. FINDINGS: The heart is borderline enlarged and mediastinal  contours are within normal limits. Atelectasis or infiltrate is present at the left lung base. No effusion or pneumothorax is seen. Degenerative changes are noted in the thoracic spine. Sternotomy wires are seen. No acute osseous abnormality. IMPRESSION: Atelectasis or infiltrate at the left lung base. Electronically Signed   By: Leita Birmingham M.D.   On: 02/25/2024 14:02    Pertinent labs & imaging results that were available during my care of the patient were reviewed by me and considered in my medical decision making (see MDM for details).  Medications Ordered in ED Medications  cefTRIAXone  (ROCEPHIN ) 1 g in sodium chloride  0.9 % 100 mL IVPB (1 g Intravenous New Bag/Given 02/25/24 1524)  doxycycline  (VIBRA -TABS) tablet 100 mg (100 mg Oral Given 02/25/24 1522)                                                                                                                                     Procedures Procedures  (including critical care time)  Medical Decision Making / ED Course   This patient presents to the ED for concern of cough, chest congestion, pain in the back, this involves an extensive number of treatment options, and is a complaint that carries with it a high risk of complications and morbidity.  The differential diagnosis includes pneumonia, pleural effusion, less likely PE, sickly ACS, less likely dissection, less likely ruptured AAA.  MDM: Patient looks quite well on exam.  Symptoms of productive cough and pain certainly could be consistent with pulmonary process.  Patient had chest x-ray at triage, showed a question of infiltrate or atelectasis.  On auscultation, patient has some rhonchi in his left lower lobe, this with his x-ray and symptoms most consistent with pneumonia.  Will check basic blood work on the patient, provide him with some antibiotics here.  I do not believe this represents ACS given his constellation of symptoms and of canceled his troponin.  Patient here with  son at bedside.  I did perform a bedside ultrasound in the area, does appear consistent with pneumonia.  There is no pleural effusion.  He lives in his son's home, they feel safe with discharge.  Will discharge with amoxicillin  and doxycycline .  Curb 67 score of 1, getting a point for age.  Additional history obtained: -Additional history obtained from son at bedside -External records from outside source obtained and reviewed including: Chart review including previous notes, labs, imaging, consultation notes   Lab Tests: -I ordered, reviewed, and interpreted labs.   The pertinent results include:   Labs Reviewed  BASIC METABOLIC PANEL WITH GFR - Abnormal; Notable for the following components:      Result Value   Glucose, Bld 104 (*)    All other  components within normal limits  CBC WITH DIFFERENTIAL/PLATELET - Abnormal; Notable for the following components:   RBC 4.21 (*)    Monocytes Absolute 1.1 (*)    All other components within normal limits      EKG   EKG Interpretation Date/Time:  Saturday February 25 2024 13:29:13 EST Ventricular Rate:  71 PR Interval:  206 QRS Duration:  142 QT Interval:  397 QTC Calculation: 432 R Axis:   9  Text Interpretation: Sinus rhythm  Multiform ventricular premature complexes  Right bundle branch block, seen Dec 2025  Inferior infarct, age indeterminate, seen Dec 2025 Confirmed by Rogelia Satterfield ([id number]) on 02/25/2024 2:50:39 PM         Imaging Studies ordered: I ordered imaging studies including chest x-ray I independently visualized and interpreted imaging. I agree with the radiologist interpretation   Medicines ordered and prescription drug management: Meds ordered this encounter  Medications   cefTRIAXone  (ROCEPHIN ) 1 g in sodium chloride  0.9 % 100 mL IVPB    Antibiotic Indication::   CAP   doxycycline  (VIBRA -TABS) tablet 100 mg   amoxicillin  (AMOXIL ) 500 MG capsule    Sig: Take 1 capsule (500 mg total) by mouth 3 (three)  times daily.    Dispense:  21 capsule    Refill:  0   doxycycline  (VIBRAMYCIN ) 100 MG capsule    Sig: Take 1 capsule (100 mg total) by mouth 2 (two) times daily.    Dispense:  20 capsule    Refill:  0    -I have reviewed the patients home medicines and have made adjustments as needed    Cardiac Monitoring: The patient was maintained on a cardiac monitor.  I personally viewed and interpreted the cardiac monitored which showed an underlying rhythm of: Normal sinus rhythm  Social Determinants of Health:  Factors impacting patients care include: Lack of access to primary care   Reevaluation: After the interventions noted above, I reevaluated the patient and found that they have :improved  Co morbidities that complicate the patient evaluation  Past Medical History:  Diagnosis Date   Allergy    Arthritis    Cataract    Coronary artery disease    GERD (gastroesophageal reflux disease)    Glaucoma    Heart murmur    High cholesterol    Hypertension    Hypothyroid    Myocardial infarction [facility])       Dispostion: I considered admission for this patient, however given his overall well appearance, believe he is appropriate for outpatient management.     Final Clinical Impression(s) / ED Diagnoses Final diagnoses:  Community acquired pneumonia of left lower lobe of lung     @PCDICTATION @     [1]  Social History Tobacco Use   Smoking status: Former    Types: Cigarettes   Smokeless tobacco: Never   Tobacco comments:    2003 quit  Vaping Use   Vaping status: Never Used  Substance Use Topics   Alcohol use: Yes    Alcohol/week: 0.0 standard drinks of alcohol    Comment: occasional   Drug use: No     Mannie Pac T, DO 02/25/24 1552  "

## 2024-03-01 ENCOUNTER — Ambulatory Visit: Payer: Self-pay

## 2024-03-01 ENCOUNTER — Ambulatory Visit: Payer: Self-pay | Admitting: Medical

## 2024-03-01 ENCOUNTER — Ambulatory Visit: Admitting: Medical

## 2024-03-01 VITALS — BP 142/80 | HR 69 | Temp 97.9°F | Resp 15 | Ht 72.0 in | Wt 199.4 lb

## 2024-03-01 DIAGNOSIS — R195 Other fecal abnormalities: Secondary | ICD-10-CM

## 2024-03-01 DIAGNOSIS — R0789 Other chest pain: Secondary | ICD-10-CM | POA: Diagnosis not present

## 2024-03-01 DIAGNOSIS — J189 Pneumonia, unspecified organism: Secondary | ICD-10-CM | POA: Diagnosis not present

## 2024-03-01 LAB — CBC WITH DIFFERENTIAL/PLATELET
Basophils Absolute: 0.1 K/uL (ref 0.0–0.1)
Basophils Relative: 1 % (ref 0.0–3.0)
Eosinophils Absolute: 0.1 K/uL (ref 0.0–0.7)
Eosinophils Relative: 1.2 % (ref 0.0–5.0)
HCT: 38.3 % — ABNORMAL LOW (ref 39.0–52.0)
Hemoglobin: 12.6 g/dL — ABNORMAL LOW (ref 13.0–17.0)
Lymphocytes Relative: 18.8 % (ref 12.0–46.0)
Lymphs Abs: 1.9 K/uL (ref 0.7–4.0)
MCHC: 33 g/dL (ref 30.0–36.0)
MCV: 93.1 fl (ref 78.0–100.0)
Monocytes Absolute: 1.4 K/uL — ABNORMAL HIGH (ref 0.1–1.0)
Monocytes Relative: 13.4 % — ABNORMAL HIGH (ref 3.0–12.0)
Neutro Abs: 6.6 K/uL (ref 1.4–7.7)
Neutrophils Relative %: 65.6 % (ref 43.0–77.0)
Platelets: 257 K/uL (ref 150.0–400.0)
RBC: 4.11 Mil/uL — ABNORMAL LOW (ref 4.22–5.81)
RDW: 13.3 % (ref 11.5–15.5)
WBC: 10.1 K/uL (ref 4.0–10.5)

## 2024-03-01 LAB — TROPONIN I (HIGH SENSITIVITY): High Sens Troponin I: 11 ng/L (ref 2–17)

## 2024-03-01 MED ORDER — BENZONATATE 100 MG PO CAPS: 100.0000 mg | ORAL_CAPSULE | Freq: Three times a day (TID) | ORAL | 0 refills | Status: DC | PRN

## 2024-03-01 MED ORDER — MUPIROCIN 2 % EX OINT: 1.0000 | TOPICAL_OINTMENT | Freq: Two times a day (BID) | CUTANEOUS | 0 refills | Status: DC

## 2024-03-01 NOTE — Telephone Encounter (Signed)
 Recommend scheduling an appt. Seek immediate care if symptoms worsen prior to that.

## 2024-03-01 NOTE — Progress Notes (Signed)
 "  Subjective:    Patient ID: Jeffrey Rhodes, male    DOB: 1940-10-23, 84 y.o.   MRN: [id number]  HPI  Pt in for follow up from ED.    Arrival date & time: 02/25/24 1319   Chief Complaint(s) Chest Pain   HPI Jeffrey Rhodes is a 84 y.o. male who is here today for congestion, productive cough, pain in the back and up to his shoulders that began over the last couple of days.  He has been taking over-the-counter medications without improvement.  He has not felt as though his had a fever or chills.   Radiology DG Chest 2 View Result Date: 02/25/2024 CLINICAL DATA:  Chest pain and productive cough. EXAM: CHEST - 2 VIEW COMPARISON:  04/27/2015. FINDINGS: The heart is borderline enlarged and mediastinal contours are within normal limits. Atelectasis or infiltrate is present at the left lung base. No effusion or pneumothorax is seen. Degenerative changes are noted in the thoracic spine. Sternotomy wires are seen. No acute osseous abnormality. IMPRESSION: Atelectasis or infiltrate at the left lung base. Electronically Signed   By: Leita Birmingham M.D.   On: 02/25/2024 14:02     Medical Decision Making / ED Course     This patient presents to the ED for concern of cough, chest congestion, pain in the back, this involves an extensive number of treatment options, and is a complaint that carries with it a high risk of complications and morbidity.  The differential diagnosis includes pneumonia, pleural effusion, less likely PE, sickly ACS, less likely dissection, less likely ruptured AAA.   MDM: Patient looks quite well on exam.  Symptoms of productive cough and pain certainly could be consistent with pulmonary process.  Patient had chest x-ray at triage, showed a question of infiltrate or atelectasis.  On auscultation, patient has some rhonchi in his left lower lobe, this with his x-ray and symptoms most consistent with pneumonia.  Will check basic blood work on the patient, provide him with some  antibiotics here.  I do not believe this represents ACS given his constellation of symptoms and of canceled his troponin.   Patient here with son at bedside.   I did perform a bedside ultrasound in the area, does appear consistent with pneumonia.  There is no pleural effusion.   He lives in his son's home, they feel safe with discharge.   Will discharge with amoxicillin  and doxycycline .   Curb 65 score of 1, getting a point for age.   EKG    EKG Interpretation Date/Time:                      Saturday February 25 2024 13:29:13 EST Ventricular Rate:   71 PR Interval:                      206 QRS Duration:                    142 QT Interval:                      397 QTC Calculation:432 R Axis:                         9   Text Interpretation: Sinus rhythm  Multiform ventricular premature complexes  Right bundle branch block, seen Dec 2025  Inferior infarct, age indeterminate, seen Dec 2025 Confirmed by Rogelia Satterfield (  [id number]) on 02/25/2024 2:50:39       Final Clinical Impression(s) / ED Diagnoses Final diagnoses:  Community acquired pneumonia of left lower lobe of lung     Jeffrey Rhodes is an 84 year old male with aortic valve disorder who presents with intermittet mild chest pain and productive cough over past 5-6 days.  He has had  mildchest pain across the upper chest from shoulder to shoulder that began this morning, woke him from sleep, and lasted 2 to 3 hours before improving/resolving completely. SABRA He describes it as similar to pain he had five days ago when he was diagnosed with pneumonia. This morning described coughing alot aroudn time of the pain.  He has a productive cough with chest congestion. He denies fever or chills. The cough does not disturb his sleep.  He was diagnosed with pneumonia five days ago with chest pain and productive cough and was tells me cxr showed pneumonia He states US  done confirming infection. He is on amoxicillin  and doxycycline  and previously  received IV  roecephon injectable antibiotics.  He has had loose, semi-solid stools for the past three days.  He has aortic valve disorder and PVCs on ekg done in ED.   He has had irritation in the rt  lower eyelid of one eye for about two weeks.    Review of Systems  Constitutional:  Negative for chills, fatigue and fever.  HENT:  Negative for congestion.   Respiratory:  Positive for cough. Negative for chest tightness, shortness of breath and wheezing.   Cardiovascular:  Negative for palpitations and leg swelling.       Atypical chest pain  Gastrointestinal:  Negative for abdominal distention, abdominal pain and diarrhea.  Genitourinary:  Negative for dysuria and flank pain.  Musculoskeletal:  Negative for back pain and myalgias.  Skin:  Negative for rash.  Neurological:  Negative for dizziness, syncope and headaches.  Psychiatric/Behavioral:  Negative for behavioral problems and dysphoric mood.     Past Medical History:  Diagnosis Date   Allergy    Arthritis    Cataract    Coronary artery disease    GERD (gastroesophageal reflux disease)    Glaucoma    Heart murmur    High cholesterol    Hypertension    Hypothyroid    Myocardial infarction [facility])      Social History   Socioeconomic History   Marital status: Widowed    Spouse name: Not on file   Number of children: 2   Years of education: Not on file   Highest education level: Associate degree: occupational, scientist, product/process development, or vocational program  Occupational History   Occupation: Paramedic  Tobacco Use   Smoking status: Former    Types: Cigarettes   Smokeless tobacco: Never   Tobacco comments:    2003 quit  Vaping Use   Vaping status: Never Used  Substance and Sexual Activity   Alcohol use: Yes    Alcohol/week: 0.0 standard drinks of alcohol    Comment: occasional   Drug use: No   Sexual activity: Not Currently  Other Topics Concern   Not on file  Social History Narrative   Not on file    Social Drivers of Health   Tobacco Use: Medium Risk (01/30/2024)   Patient History    Smoking Tobacco Use: Former    Smokeless Tobacco Use: Never    Passive Exposure: Not on file  Financial Resource Strain: Medium Risk (01/27/2024)   Overall Physicist, Medical Strain (  CARDIA)    Difficulty of Paying Living Expenses: Somewhat hard  Food Insecurity: No Food Insecurity (01/27/2024)   Epic    Worried About Programme Researcher, Broadcasting/film/video in the Last Year: Never true    Ran Out of Food in the Last Year: Never true  Transportation Needs: No Transportation Needs (01/27/2024)   Epic    Lack of Transportation (Medical): No    Lack of Transportation (Non-Medical): No  Physical Activity: Insufficiently Active (01/27/2024)   Exercise Vital Sign    Days of Exercise per Week: 2 days    Minutes of Exercise per Session: 20 min  Stress: No Stress Concern Present (01/27/2024)   [employer] of Occupational Health - Occupational Stress Questionnaire    Feeling of Stress: Not at all  Social Connections: Socially Isolated (01/27/2024)   Social Connection and Isolation Panel    Frequency of Communication with Friends and Family: More than three times a week    Frequency of Social Gatherings with Friends and Family: Once a week    Attends Religious Services: Patient declined    Database Administrator or Organizations: No    Attends Banker Meetings: Not on file    Marital Status: Widowed  Intimate Partner Violence: Not At Risk (12/28/2022)   Humiliation, Afraid, Rape, and Kick questionnaire    Fear of Current or Ex-Partner: No    Emotionally Abused: No    Physically Abused: No    Sexually Abused: No  Depression (PHQ2-9): Low Risk (01/30/2024)   Depression (PHQ2-9)    PHQ-2 Score: 0  Alcohol Screen: Low Risk (01/27/2024)   Alcohol Screen    Last Alcohol Screening Score (AUDIT): 1  Housing: Unknown (01/27/2024)   Epic    Unable to Pay for Housing in the Last Year: No    Number of Times Moved in  the Last Year: Not on file    Homeless in the Last Year: No  Utilities: Not At Risk (12/28/2022)   [facility] Utilities    Threatened with loss of utilities: No  Health Literacy: Adequate Health Literacy (12/28/2022)   B1300 Health Literacy    Frequency of need for help with medical instructions: Never    Past Surgical History:  Procedure Laterality Date   CORONARY ARTERY BYPASS GRAFT  2014   [place]   DENTAL RESTORATION/EXTRACTION WITH X-RAY      Family History  Problem Relation Age of Onset   Breast cancer Mother    Diabetes Brother    Heart disease Brother    Colon cancer Neg Hx    Esophageal cancer Neg Hx    Rectal cancer Neg Hx    Stomach cancer Neg Hx     Allergies[1]  Medications Ordered Prior to Encounter[2]  BP (!) 142/80   Pulse 69   Temp 97.9 F (36.6 C) (Oral)   Resp 15   Ht 6' (1.829 m)   Wt 199 lb 6.4 oz (90.4 kg)   SpO2 97%   BMI 27.04 kg/m        Objective:   Physical Exam  General Mental Status- Alert. General Appearance- Not in acute distress.   Skin General: Color- Normal Color. Moisture- Normal Moisture.  Neck Carotid Arteries- Normal color. Moisture- Normal Moisture. No carotid bruits. No JVD.  Chest and Lung Exam Auscultation: Breath Sounds:-CTA  Cardiovascular Auscultation:Rythm- RRR Murmurs & Other Heart Sounds:Auscultation of the heart reveals- No Murmurs.  Abdomen Inspection:-Inspeection Normal. Palpation/Percussion:Note:No mass. Palpation and Percussion of the abdomen reveal- Non  Tender, Non Distended + BS, no rebound or guarding.   Neurologic Cranial Nerve exam:- CN III-XII intact(No nystagmus), symmetric smile. Strength:- 5/5 equal and symmetric strength both upper and lower extremities.    Lower legs- no pedal edema, negative homans signs and calfs symmetric.  Skin- lower eye lid rt eye small follicle at upper zygomatic arch.     Assessment & Plan:   [phone]  Patient Instructions  Community acquired  pneumonia, left lower lobe Pneumonia in the left lower lobe with chest pain, productive cough, and congestion. Imaging showed atelectasis or infiltrate. Treated with amoxicillin  and doxycycline . Doxycycline  preferred for broader coverage. - Continue amoxicillin  and doxycycline . - Order stool studies if stools become watery to rule out C. difficile infection, - Advised taking doxycycline  with food to minimize gastrointestinal side effects. - Prescribed benzonatate  for cough. - Recommended over-the-counter Mucinex for congestion.  Atypical chest pain Intermittent upper chest pain, exacerbated by coughing. Differential includes musculoskeletal versus cardiac etiology. Recent EKG showed sinus rhythm with PVCs. Further cardiac evaluation warranted for caution sake thought clinical picture not indicating cardiac. With age and duration of symptoms thought best to proceed with caution. - Order EKG to assess for changes. - troponin stat -if chest component of symptoms worsen or other associated symptoms be seen in ED. -ekg sinus rhythm first degree av block. Similar to last ekg but no pvcs..   Loose stools Stools are semi-solid, reducing likelihood of C. difficile infection. Discussed potential for antibiotic-induced diarrhea. - Order stool studies if stools become watery to rule out C. difficile infection. - Advise monitoring stool consistency and reporting changes.  Aortic valve disorder Requiring future valve replacement.  Lower eyelid irritation Irritation possibly due to folliculitis.  No discharge or significant redness. Symptoms persistent for two weeks. - Prescribe mupirocin  topical antibiotic twice daily. - Advise avoiding application near eyelashes. - Consider referral to dermatologist if symptoms persist.  Follow up 8 days with pcp or sooner if needed    I personally spent a total of 46 minutes in the care of the patient today including performing a medically appropriate  exam/evaluation, counseling and educating, placing orders, and documenting clinical information in the EHR. Total time did not incude time to do EKG.  Lesli Issa, PA-C     [1]  Allergies Allergen Reactions   Bee Venom Other (See Comments)    unknown unknown    Lisinopril Cough   Losartan Cough  [2]  Current Outpatient Medications on File Prior to Visit  Medication Sig Dispense Refill   amLODipine  (NORVASC ) 10 MG tablet Take 1 tablet (10 mg total) by mouth daily. 90 tablet 2   amoxicillin  (AMOXIL ) 500 MG capsule Take 1 capsule (500 mg total) by mouth 3 (three) times daily. 21 capsule 0   aspirin  EC 81 MG tablet Take 81 mg by mouth daily. Swallow whole.     doxycycline  (VIBRAMYCIN ) 100 MG capsule Take 1 capsule (100 mg total) by mouth 2 (two) times daily. 20 capsule 0   ezetimibe  (ZETIA ) 10 MG tablet Take 1 tablet (10 mg total) by mouth daily. 90 tablet 0   fluticasone  (FLONASE ) 50 MCG/ACT nasal spray PLACE 2 SPRAYS INTO BOTH NOSTRILS DAILY AS NEEDED FOR ALLERGIES OR RHINITIS. 48 mL 3   latanoprost  (XALATAN ) 0.005 % ophthalmic solution Place 1 drop into both eyes at bedtime.     levothyroxine  (SYNTHROID ) 125 MCG tablet Take 1 tablet (125 mcg total) by mouth daily. 90 tablet 3   methocarbamol  (ROBAXIN ) 500 MG tablet Take  1 tablet (500 mg total) by mouth every 6 (six) hours as needed for muscle spasms. 30 tablet 3   metoprolol  succinate (TOPROL -XL) 25 MG 24 hr tablet Take 1 tablet (25 mg total) by mouth daily. Take with or immediately following a meal 90 tablet 3   rosuvastatin  (CRESTOR ) 40 MG tablet Take 1 tablet (40 mg total) by mouth daily. 90 tablet 3   SYNTHROID  125 MCG tablet Take 1 tablet (125 mcg total) by mouth daily before breakfast. 90 tablet 0   tamsulosin  (FLOMAX ) 0.4 MG CAPS capsule Take 1 capsule (0.4 mg total) by mouth daily. 90 capsule 3   zolpidem  (AMBIEN ) 10 MG tablet Take 0.5-1 tablets (5-10 mg total) by mouth at bedtime as needed. for sleep 90 tablet 1   No  current facility-administered medications on file prior to visit.   "

## 2024-03-01 NOTE — Telephone Encounter (Signed)
 Appt scheduled w/ [place] today at [facility]

## 2024-03-01 NOTE — Telephone Encounter (Signed)
 FYI Only or Action Required?: Action required by provider: Refusing ED, requesting new med/appt, alerted CAL to ED refusal.  Patient was last seen in primary care on 01/30/2024 by Frann Mabel Mt, DO.  Called Nurse Triage reporting Chest Pain worsening.  Symptoms began several days ago.  Interventions attempted: Prescription medications: amoxicillin  and doxycycline  from ED on 1/3, pt discontinued doxycycline  on his own.  Symptoms are: rapidly worsening.  Triage Disposition: Go to ED Now (Notify PCP)  Patient/caregiver understands and will follow disposition?: No, refuses disposition     Copied from CRM #[id number]. Topic: Clinical - Red Word Triage >> Mar 01, 2024  7:53 AM Deleta RAMAN wrote: Red Word that prompted transfer to Nurse Triage: patient went to the er last Saturday was told he had pneumonia. Patient states his chest has been hurting and has being worst this morning. Medicine from er giving him serve diarrhea and back pain Reason for Disposition  New-onset or worsening chest pain  Answer Assessment - Initial Assessment Questions This RN recommended pt be examined in hospital, pt refusing. Advised pt call 911 or get to hospital asap if any new or worsening symptoms. Sending message to PCP office for call back to pt with further recommendations. Alerted CAL to ED refusal.    Just some pain in my chest again, going across my chest Had to stop taking one of the meds, because giving bad diarrhea, stopped taking one of meds Requesting different med or see if appt in next couple hours Just a little bit SOB this morning, better now No heart racing Chest pain just goes across chest and into both shoulders No neck/arm pain A little bit swelling on lymph nodes in neck No cold/clammy skin or fever No excessive sweating Chest pain about the same as went to hospital, got little better, but gotten same as when went, so worsening Steady chest pain, pressure, just there, may ease  up Just got out of the bed so don't know Resting and it got worse No back pain, was hurting the other day After started taking the med, made back worse Diarrhea was messing with whatever system in the back No rectal pain, just in upper hip Been operated on back No feeling weaker than usual No fever Just feel the same, don't feel weaker No dizziness Can't take that second one, diarrhea too much too bad Taking both meds, started couple days later Stopped the doxycycline  Was getting better then chest pain just started back up today Just had spinal operation, can't drive  Protocols used: Pneumonia on Antibiotic Post-Hospitalization Follow-up Call-[facility]

## 2024-03-01 NOTE — Telephone Encounter (Signed)
 Patient reports he was having chest pain Saturday when he was seen at ed, had EKG/ xray and was diagnosed with pneumonia.  Had to DC doxycycline  yesterday am due to diarrhea, symptoms not better, still chest  pain and SOB.  Will consult with pcp for advise.

## 2024-03-01 NOTE — Patient Instructions (Addendum)
 Community acquired pneumonia, left lower lobe Pneumonia in the left lower lobe with chest pain, productive cough, and congestion. Imaging showed atelectasis or infiltrate. Treated with amoxicillin  and doxycycline . Doxycycline  preferred for broader coverage. - Continue amoxicillin  and doxycycline . - Order stool studies if stools become watery to rule out C. difficile infection, - Advised taking doxycycline  with food to minimize gastrointestinal side effects. - Prescribed benzonatate  for cough. - Recommended over-the-counter Mucinex for congestion.  Atypical chest pain Intermittent upper chest pain, exacerbated by coughing. Differential includes musculoskeletal versus cardiac etiology. Recent EKG showed sinus rhythm with PVCs. Further cardiac evaluation warranted for caution sake thought clinical picture not indicating cardiac. With age and duration of symptoms thought best to proceed with caution. - Order EKG to assess for changes. - troponin stat -if chest component of symptoms worsen or other associated symptoms be seen in ED. -ekg sinus rhythm first degree av block. Similar to last ekg but no pvcs..   Loose stools Stools are semi-solid, reducing likelihood of C. difficile infection. Discussed potential for antibiotic-induced diarrhea. - Order stool studies if stools become watery to rule out C. difficile infection. - Advise monitoring stool consistency and reporting changes.  Aortic valve disorder Requiring future valve replacement.  Lower eyelid irritation Irritation possibly due to folliculitis.  No discharge or significant redness. Symptoms persistent for two weeks. - Prescribe mupirocin  topical antibiotic twice daily. - Advise avoiding application near eyelashes. - Consider referral to dermatologist if symptoms persist.  Follow up 8 days with pcp or sooner if needed

## 2024-03-01 NOTE — Telephone Encounter (Signed)
 Please advise

## 2024-03-09 ENCOUNTER — Ambulatory Visit: Admitting: Family Medicine

## 2024-03-12 ENCOUNTER — Encounter: Payer: Self-pay | Admitting: Family Medicine

## 2024-03-12 ENCOUNTER — Ambulatory Visit (INDEPENDENT_AMBULATORY_CARE_PROVIDER_SITE_OTHER): Admitting: Family Medicine

## 2024-03-12 ENCOUNTER — Ambulatory Visit: Payer: Self-pay | Admitting: Family Medicine

## 2024-03-12 ENCOUNTER — Other Ambulatory Visit

## 2024-03-12 VITALS — BP 120/74 | HR 77 | Temp 98.0°F | Resp 16 | Ht 73.0 in | Wt 210.4 lb

## 2024-03-12 DIAGNOSIS — D649 Anemia, unspecified: Secondary | ICD-10-CM

## 2024-03-12 DIAGNOSIS — R051 Acute cough: Secondary | ICD-10-CM

## 2024-03-12 DIAGNOSIS — E039 Hypothyroidism, unspecified: Secondary | ICD-10-CM

## 2024-03-12 LAB — IBC + FERRITIN
Ferritin: 34.5 ng/mL (ref 22.0–322.0)
Iron: 72 ug/dL (ref 42–165)
Saturation Ratios: 21.3 % (ref 20.0–50.0)
TIBC: 337.4 ug/dL (ref 250.0–450.0)
Transferrin: 241 mg/dL (ref 212.0–360.0)

## 2024-03-12 LAB — T4, FREE: Free T4: 1.17 ng/dL (ref 0.60–1.60)

## 2024-03-12 LAB — CBC
HCT: 37 % — ABNORMAL LOW (ref 39.0–52.0)
Hemoglobin: 12.5 g/dL — ABNORMAL LOW (ref 13.0–17.0)
MCHC: 33.8 g/dL (ref 30.0–36.0)
MCV: 90.5 fl (ref 78.0–100.0)
Platelets: 242 K/uL (ref 150.0–400.0)
RBC: 4.09 Mil/uL — ABNORMAL LOW (ref 4.22–5.81)
RDW: 13.3 % (ref 11.5–15.5)
WBC: 8.3 K/uL (ref 4.0–10.5)

## 2024-03-12 LAB — TSH: TSH: 0.08 u[IU]/mL — ABNORMAL LOW (ref 0.35–5.50)

## 2024-03-12 NOTE — Progress Notes (Signed)
 Chief Complaint  Patient presents with   Follow-up    Follow Up    Subjective: Patient is a 84 y.o. male here for f/u.  Still having a lingering cough after dx'd w L lobar PNA. Finished his abx, feeling better overall. No wheezing, SOB, runny/stuffy nose, ST, sinus pain, ear pain/drainage, itchy eyes, fevers, myalgias. No longer using anything at home.   Past Medical History:  Diagnosis Date   Allergy    Arthritis    Cataract    Coronary artery disease    GERD (gastroesophageal reflux disease)    Glaucoma    Heart murmur    High cholesterol    Hypertension    Hypothyroid    Myocardial infarction (HCC)     Objective: BP 120/74 (BP Location: Left Arm, Patient Position: Sitting)   Pulse 77   Temp 98 F (36.7 C) (Oral)   Resp 16   Ht 6' 1 (1.854 m)   Wt 210 lb 6.4 oz (95.4 kg)   SpO2 98%   BMI 27.76 kg/m  General: Awake, appears stated age HEENT: ear canal on R 100% obstructed w cerumen, canal patent and TM neg on L, nares patent w/o dc, no sinus ttp, MMM, no exudate/erythema Heart: RRR, no LE edema Lungs: CTAB, no rales, wheezes or rhonchi. No accessory muscle use Psych: Age appropriate judgment and insight, normal affect and mood  Assessment and Plan: Acute cough  Hypothyroidism, unspecified type - Plan: TSH, T4, free  Anemia, unspecified type - Plan: CBC, IBC + Ferritin  Improving. IS rec'd. Tessalon  Perles prn. F/u on above labs today.  The patient voiced understanding and agreement to the plan.  Mabel Mt [place], DO 03/12/24  10:00 AM

## 2024-03-12 NOTE — Patient Instructions (Addendum)
 Use your incentive spirometer every 1-2 hours while awake.   OK to use the cough medicine as needed.   Give us  2-3 business days to get the results of your labs back.   Let us  know if you need anything.

## 2024-07-30 ENCOUNTER — Encounter: Admitting: Family Medicine
